# Patient Record
Sex: Male | Born: 1985 | Race: White | Hispanic: No | Marital: Married | Smoking: Never smoker
Health system: Southern US, Community
[De-identification: ages and names within clinical notes are randomized; demographics above are authoritative.]

## PROBLEM LIST (undated history)

## (undated) DIAGNOSIS — K219 Gastro-esophageal reflux disease without esophagitis: Secondary | ICD-10-CM

## (undated) DIAGNOSIS — T7840XA Allergy, unspecified, initial encounter: Secondary | ICD-10-CM

## (undated) DIAGNOSIS — M2141 Flat foot [pes planus] (acquired), right foot: Secondary | ICD-10-CM

## (undated) DIAGNOSIS — K7581 Nonalcoholic steatohepatitis (NASH): Secondary | ICD-10-CM

## (undated) DIAGNOSIS — M7650 Patellar tendinitis, unspecified knee: Secondary | ICD-10-CM

## (undated) DIAGNOSIS — F419 Anxiety disorder, unspecified: Secondary | ICD-10-CM

## (undated) DIAGNOSIS — L409 Psoriasis, unspecified: Secondary | ICD-10-CM

## (undated) DIAGNOSIS — Z5189 Encounter for other specified aftercare: Secondary | ICD-10-CM

## (undated) DIAGNOSIS — M2142 Flat foot [pes planus] (acquired), left foot: Secondary | ICD-10-CM

## (undated) DIAGNOSIS — J309 Allergic rhinitis, unspecified: Secondary | ICD-10-CM

## (undated) HISTORY — DX: Anxiety disorder, unspecified: F41.9

## (undated) HISTORY — DX: Flat foot (pes planus) (acquired), right foot: M21.42

## (undated) HISTORY — DX: Flat foot (pes planus) (acquired), right foot: M21.41

## (undated) HISTORY — PX: WISDOM TOOTH EXTRACTION: SHX21

## (undated) HISTORY — DX: Psoriasis, unspecified: L40.9

## (undated) HISTORY — DX: Nonalcoholic steatohepatitis (NASH): K75.81

## (undated) HISTORY — DX: Encounter for other specified aftercare: Z51.89

## (undated) HISTORY — DX: Patellar tendinitis, unspecified knee: M76.50

## (undated) HISTORY — DX: Allergy, unspecified, initial encounter: T78.40XA

---

## 2011-06-07 ENCOUNTER — Ambulatory Visit (INDEPENDENT_AMBULATORY_CARE_PROVIDER_SITE_OTHER): Payer: BC Managed Care – PPO

## 2011-06-07 DIAGNOSIS — J019 Acute sinusitis, unspecified: Secondary | ICD-10-CM

## 2011-06-07 DIAGNOSIS — J029 Acute pharyngitis, unspecified: Secondary | ICD-10-CM

## 2012-03-17 ENCOUNTER — Ambulatory Visit (INDEPENDENT_AMBULATORY_CARE_PROVIDER_SITE_OTHER): Payer: BC Managed Care – PPO | Admitting: Physician Assistant

## 2012-03-17 ENCOUNTER — Ambulatory Visit
Admission: RE | Admit: 2012-03-17 | Discharge: 2012-03-17 | Disposition: A | Discharge status: Home / Self Care | Payer: Self-pay | Source: Ambulatory Visit | Attending: Physician Assistant | Admitting: Physician Assistant

## 2012-03-17 VITALS — BP 138/78 | HR 84 | Temp 97.7°F | Resp 16 | Ht 78.0 in | Wt 263.0 lb

## 2012-03-17 DIAGNOSIS — R1011 Right upper quadrant pain: Secondary | ICD-10-CM

## 2012-03-17 DIAGNOSIS — R110 Nausea: Secondary | ICD-10-CM

## 2012-03-17 LAB — POCT URINALYSIS DIPSTICK
Bilirubin, UA: NEGATIVE
Blood, UA: NEGATIVE
Glucose, UA: NEGATIVE
Leukocytes, UA: NEGATIVE
Nitrite, UA: NEGATIVE

## 2012-03-17 LAB — COMPREHENSIVE METABOLIC PANEL
ALT: 133 U/L — ABNORMAL HIGH (ref 0–53)
AST: 67 U/L — ABNORMAL HIGH (ref 0–37)
Albumin: 4.7 g/dL (ref 3.5–5.2)
Calcium: 9.9 mg/dL (ref 8.4–10.5)
Chloride: 105 mEq/L (ref 96–112)
Potassium: 4.6 mEq/L (ref 3.5–5.3)
Total Protein: 7.9 g/dL (ref 6.0–8.3)

## 2012-03-17 LAB — POCT CBC
HCT, POC: 52.4 % (ref 43.5–53.7)
Hemoglobin: 17 g/dL (ref 14.1–18.1)
MCH, POC: 31.1 pg (ref 27–31.2)
MPV: 9.3 fL (ref 0–99.8)
POC MID %: 6.2 %M (ref 0–12)
RBC: 5.46 M/uL (ref 4.69–6.13)
WBC: 7.7 10*3/uL (ref 4.6–10.2)

## 2012-03-17 MED ORDER — ONDANSETRON 8 MG PO TBDP: 8.0000 mg | ORAL_TABLET | Freq: Three times a day (TID) | ORAL | Status: DC | PRN

## 2012-03-17 MED ORDER — HYDROCODONE-ACETAMINOPHEN 5-500 MG PO TABS: 1.0000 | ORAL_TABLET | Freq: Three times a day (TID) | ORAL | Status: DC | PRN

## 2012-03-17 NOTE — Patient Instructions (Signed)
Ultrasound at [facility] at 12:30pm today.  We will call you with results.  Use Vicodin for pain and Zofran for nausea as needed.

## 2012-03-17 NOTE — Progress Notes (Signed)
Subjective:    Patient ID: Luis Khan, male    DOB: 1985-10-11, 26 y.o.   MRN: [id number]  HPI  Luis Khan is a 26 yr old male with a 12 hour history of RUQ abdominal pain.  About 9pm last night he noticed pain under his right ribs.  He thought this might be gas pain and took some gas relief medication but experienced no relief.  The pain worsened through the evening.  He had difficulty sleeping and finding a comfortable position.  Last night laying down felt better, today sitting up feels better.  He states the pain is similar to a side pain that you would get while exercising.  He had some nausea and dry heaving this morning, but no vomiting.  He has not been nauseous for a couple hours though.  Lots of gas and burping today.  He had a normal bowel movement this morning.  He has never had pain like this before.  He ate pasta for dinner last night at [employer].  Has not eaten today.  There is no mechanism of injury.  The pain occasionally radiates across the abdomen from the RUQ to episgastrum.  No radiation to the shoulder.  No back pain, no urinary symptoms.  No fever, chills.  Patient still has gallbladder and appendix.  Patient does note a history of hypertriglyceridemia and non-alcoholic fatty liver.      Review of Systems  Constitutional: Positive for appetite change. Negative for fever, chills and diaphoresis.  HENT: Negative.   Eyes: Negative.   Respiratory: Negative.   Cardiovascular: Negative.   Genitourinary: Negative.   Musculoskeletal: Negative.   Neurological: Negative.        Objective:   Physical Exam  Constitutional: He is oriented to person, place, and time. He appears well-developed and well-nourished. No distress.  Cardiovascular: Normal rate, regular rhythm and normal heart sounds.   Pulmonary/Chest: Breath sounds normal. He has no wheezes. He has no rales.  Abdominal: Soft. Normal appearance and bowel sounds are normal. He exhibits no distension and no mass. There  is no hepatosplenomegaly. There is tenderness in the right upper quadrant and epigastric area. There is no rigidity, no rebound, no guarding, no CVA tenderness and no tenderness at McBurney's point.       Murphy's sign negative, but increased pain.  Rosving's and obturator signs negative  Lymphadenopathy:    He has no cervical adenopathy.  Neurological: He is alert and oriented to person, place, and time.  Skin: Skin is warm and dry.    Filed Vitals:   03/17/12 0910  BP: 138/78  Pulse: 84  Temp: 97.7 F (36.5 C)  Resp: 16    Results for orders placed in visit on 03/17/12  POCT CBC      Component Value Range   WBC 7.7  4.6 - 10.2 K/uL   Lymph, poc 1.8  0.6 - 3.4   POC LYMPH PERCENT 23.3  10 - 50 %L   MID (cbc) 0.5  0 - 0.9   POC MID % 6.2  0 - 12 %M   POC Granulocyte 5.4  2 - 6.9   Granulocyte percent 70.5  37 - 80 %G   RBC 5.46  4.69 - 6.13 M/uL   Hemoglobin 17.0  14.1 - 18.1 g/dL   HCT, POC 40.9  81.1 - 53.7 %   MCV 95.9  80 - 97 fL   MCH, POC 31.1  27 - 31.2 pg   MCHC 32.4  31.8 -  35.4 g/dL   RDW, POC 14.7     Platelet Count, POC 282  142 - 424 K/uL   MPV 9.3  0 - 99.8 fL  POCT URINALYSIS DIPSTICK      Component Value Range   Color, UA YELLOW     Clarity, UA CLEAR     Glucose, UA NEG     Bilirubin, UA NEG     Ketones, UA NEG     Spec Grav, UA 1.020     Blood, UA NEG     pH, UA 7.5     Protein, UA NEG     Urobilinogen, UA 0.2     Nitrite, UA NEG     Leukocytes, UA Negative          Assessment & Plan:   1. Abdominal pain, RUQ  POCT CBC, POCT urinalysis dipstick, Comprehensive metabolic panel, Lipase, US Abdomen Limited RUQ, HYDROcodone-acetaminophen (VICODIN) 5-500 MG per tablet  2. Nausea  ondansetron (ZOFRAN-ODT) 8 MG disintegrating tablet   Luis Khan is a 26 yr old male with a 12 hour history of RUQ pain and some nausea.  Abdominal exam reveals tenderness in the RUQ and epigastric area.  Murphy's sign is technically negative, but he experienced increased  pain.  The rest of his abdominal exam is unremarkable.  UA is clear.  CBC is normal.  CMet and lipase are pending.  The patient has an appointment this afternoon at [facility] for RUQ ultrasound.  At this point, we will treat his pain and nausea, and await a read on his U/S.  Should that come back negative, I think we can continue to treat his symptoms with close follow-up while we await his pending labs.

## 2012-03-17 NOTE — Progress Notes (Signed)
Reviewed and agree.

## 2012-03-20 ENCOUNTER — Telehealth: Payer: Self-pay | Admitting: Physician Assistant

## 2012-03-20 NOTE — Telephone Encounter (Signed)
Spoke with the patient regarding cmet and lipase being mildly elevated.  He is feeling much better.  Recommended he RTC in a few weeks to recheck labs including fasting lipids.

## 2014-10-03 DIAGNOSIS — K219 Gastro-esophageal reflux disease without esophagitis: Secondary | ICD-10-CM

## 2014-10-03 HISTORY — DX: Gastro-esophageal reflux disease without esophagitis: K21.9

## 2015-04-09 DIAGNOSIS — K219 Gastro-esophageal reflux disease without esophagitis: Secondary | ICD-10-CM | POA: Insufficient documentation

## 2015-07-08 ENCOUNTER — Other Ambulatory Visit: Payer: Self-pay | Admitting: Medical

## 2015-07-08 DIAGNOSIS — R1011 Right upper quadrant pain: Secondary | ICD-10-CM

## 2015-07-09 ENCOUNTER — Ambulatory Visit: Payer: BLUE CROSS/BLUE SHIELD

## 2015-07-09 ENCOUNTER — Ambulatory Visit
Admission: RE | Admit: 2015-07-09 | Discharge: 2015-07-09 | Disposition: A | Discharge status: Home / Self Care | Payer: BLUE CROSS/BLUE SHIELD | Source: Ambulatory Visit | Attending: Medical | Admitting: Medical

## 2015-07-09 DIAGNOSIS — R110 Nausea: Secondary | ICD-10-CM | POA: Insufficient documentation

## 2015-07-09 DIAGNOSIS — R1011 Right upper quadrant pain: Secondary | ICD-10-CM | POA: Diagnosis not present

## 2015-07-31 ENCOUNTER — Encounter: Payer: Self-pay | Admitting: *Deleted

## 2015-08-01 ENCOUNTER — Ambulatory Visit
Admission: RE | Admit: 2015-08-01 | Discharge: 2015-08-01 | Disposition: A | Discharge status: Home / Self Care | Payer: BLUE CROSS/BLUE SHIELD | Source: Ambulatory Visit | Attending: Gastroenterology | Admitting: Gastroenterology

## 2015-08-01 ENCOUNTER — Encounter
Admission: RE | Disposition: A | Discharge status: Home / Self Care | Payer: Self-pay | Source: Ambulatory Visit | Attending: Gastroenterology

## 2015-08-01 ENCOUNTER — Encounter: Payer: Self-pay | Admitting: *Deleted

## 2015-08-01 ENCOUNTER — Ambulatory Visit: Payer: BLUE CROSS/BLUE SHIELD | Admitting: Anesthesiology

## 2015-08-01 DIAGNOSIS — K219 Gastro-esophageal reflux disease without esophagitis: Secondary | ICD-10-CM | POA: Insufficient documentation

## 2015-08-01 DIAGNOSIS — Z8249 Family history of ischemic heart disease and other diseases of the circulatory system: Secondary | ICD-10-CM | POA: Insufficient documentation

## 2015-08-01 DIAGNOSIS — K7581 Nonalcoholic steatohepatitis (NASH): Secondary | ICD-10-CM | POA: Insufficient documentation

## 2015-08-01 DIAGNOSIS — Z79899 Other long term (current) drug therapy: Secondary | ICD-10-CM | POA: Insufficient documentation

## 2015-08-01 DIAGNOSIS — K2970 Gastritis, unspecified, without bleeding: Secondary | ICD-10-CM | POA: Diagnosis not present

## 2015-08-01 DIAGNOSIS — R1013 Epigastric pain: Secondary | ICD-10-CM | POA: Diagnosis present

## 2015-08-01 HISTORY — DX: Allergic rhinitis, unspecified: J30.9

## 2015-08-01 HISTORY — DX: Gastro-esophageal reflux disease without esophagitis: K21.9

## 2015-08-01 HISTORY — PX: ESOPHAGOGASTRODUODENOSCOPY (EGD) WITH PROPOFOL: SHX5813

## 2015-08-01 SURGERY — ESOPHAGOGASTRODUODENOSCOPY (EGD) WITH PROPOFOL
Anesthesia: General

## 2015-08-01 MED ORDER — LIDOCAINE HCL (CARDIAC) 20 MG/ML IV SOLN
INTRAVENOUS | Status: DC | PRN
  Administered 2015-08-01: 60 mg via INTRAVENOUS

## 2015-08-01 MED ORDER — PROPOFOL 500 MG/50ML IV EMUL
INTRAVENOUS | Status: DC | PRN
  Administered 2015-08-01: 160 ug/kg/min via INTRAVENOUS

## 2015-08-01 MED ORDER — SODIUM CHLORIDE 0.9 % IV SOLN
INTRAVENOUS | Status: DC
  Administered 2015-08-01: 15:00:00 via INTRAVENOUS

## 2015-08-01 MED ORDER — SODIUM CHLORIDE 0.9 % IV SOLN
INTRAVENOUS | Status: DC
  Administered 2015-08-01: 1000 mL via INTRAVENOUS

## 2015-08-01 MED ORDER — FENTANYL CITRATE (PF) 100 MCG/2ML IJ SOLN
INTRAMUSCULAR | Status: DC | PRN
  Administered 2015-08-01: 50 ug via INTRAVENOUS

## 2015-08-01 MED ORDER — PROPOFOL 10 MG/ML IV BOLUS
INTRAVENOUS | Status: DC | PRN
  Administered 2015-08-01: 20 mg via INTRAVENOUS
  Administered 2015-08-01: 30 mg via INTRAVENOUS
  Administered 2015-08-01: 70 mg via INTRAVENOUS
  Administered 2015-08-01: 30 mg via INTRAVENOUS

## 2015-08-01 MED ORDER — MIDAZOLAM HCL 2 MG/2ML IJ SOLN
INTRAMUSCULAR | Status: DC | PRN
  Administered 2015-08-01 (×2): 1 mg via INTRAVENOUS

## 2015-08-01 NOTE — Op Note (Signed)
[facility] Gastroenterology Patient Name: Luis Khan Procedure Date: 08/01/2015 3:14 PM MRN: [id number] Account #: [id number] Date of Birth: May 04, 1986 Admit Type: Outpatient Age: 30 Room: [facility] ENDO ROOM 3 Gender: Male Note Status: Finalized Procedure:            Upper GI endoscopy Indications:          Epigastric abdominal pain, Dyspepsia, Gastro-esophageal                        reflux disease Providers:            Lollie Sails, MD Referring MD:         Estill Dooms. Lily Peer, MD (Referring MD) Medicines:            Monitored Anesthesia Care Complications:        No immediate complications. Procedure:            Pre-Anesthesia Assessment:                       - ASA Grade Assessment: II - A patient with mild                        systemic disease.                       After obtaining informed consent, the endoscope was                        passed under direct vision. Throughout the procedure,                        the patient's blood pressure, pulse, and oxygen                        saturations were monitored continuously. The Endoscope                        was introduced through the mouth, and advanced to the                        fourth part of duodenum. The upper GI endoscopy was                        accomplished without difficulty. The patient tolerated                        the procedure well. Findings:      The Z-line was irregular. Biopsies were taken with a cold forceps for       histology.      Diffuse mild erythema was found in the upper third of the esophagus.      Diffuse minimal inflammation characterized by erythema and granularity       was found in the gastric body. Biopsies were taken with a cold forceps       for histology.      The cardia and gastric fundus were normal on retroflexion.      Patchy mucosal flattening was found in the second portion of the       duodenum and in the third portion of the  duodenum. Impression:           - Z-line  irregular. Biopsied.                       - Erythema in the upper third of the esophagus.                       - Gastritis. Biopsied.                       - Flattened mucosa was found in the duodenum, rule out                        celiac disease. Recommendation:       - Return to GI clinic as previously scheduled.                       - Continue present medications. Procedure Code(s):    --- Professional ---                       [phone], Esophagogastroduodenoscopy, flexible, transoral;                        with biopsy, single or multiple CPT copyright 2016 American Medical Association. All rights reserved. The codes documented in this report are preliminary and upon coder review may  be revised to meet current compliance requirements. Lollie Sails, MD 08/01/2015 3:38:25 PM This report has been signed electronically. Number of Addenda: 0 Note Initiated On: 08/01/2015 3:14 PM      [facility]

## 2015-08-01 NOTE — H&P (Signed)
Outpatient short stay form Pre-procedure 08/01/2015 3:08 PM Lollie Sails MD  Primary Physician: No primary care provider  Reason for visit:  EGD  History of present illness:  Patient is a 30 year old male presenting today with history of epigastric pain and reflux. Even scheduled originally for a conscious sedation procedure however adequate sedation was not obtained and so he was rescheduled for propofol.    Current facility-administered medications:  .  0.9 %  sodium chloride infusion, , Intravenous, Continuous, Lollie Sails, MD, Last Rate: 20 mL/hr at 08/01/15 1349, 1,000 mL at 08/01/15 1349 .  0.9 %  sodium chloride infusion, , Intravenous, Continuous, Lollie Sails, MD  Prescriptions prior to admission  Medication Sig Dispense Refill Last Dose  . fexofenadine (ALLEGRA) 30 MG tablet Take 30 mg by mouth 2 (two) times daily.   08/01/2015 at Unknown time  . pantoprazole (PROTONIX) 40 MG tablet Take 40 mg by mouth daily.     . ranitidine (ZANTAC) 150 MG capsule Take 150 mg by mouth 2 (two) times daily.   08/01/2015 at Unknown time  . fluticasone (FLONASE) 50 MCG/ACT nasal spray Place 2 sprays into the nose daily.   Taking  . HYDROcodone-acetaminophen (VICODIN) 5-500 MG per tablet Take 1 tablet by mouth every 8 (eight) hours as needed for pain. 20 tablet 0   . ondansetron (ZOFRAN-ODT) 8 MG disintegrating tablet Take 1 tablet (8 mg total) by mouth every 8 (eight) hours as needed for nausea. 30 tablet 0      No Known Allergies   Past Medical History  Diagnosis Date  . Allergy   . NASH (nonalcoholic steatohepatitis)   . Chronic allergic rhinitis   . GERD (gastroesophageal reflux disease)     Review of systems:      Physical Exam    Heart and lungs: Regular rate and rhythm without rub or gallop, lungs are bilaterally clear.    HEENT: Normocephalic atraumatic eyes are anicteric    Other:     Pertinant exam for procedure: Soft nontender nondistended bowel sounds  positive normoactive.    Planned proceedures: EGD and indicated procedures. I have discussed the risks benefits and complications of procedures to include not limited to bleeding, infection, perforation and the risk of sedation and the patient wishes to proceed.    Lollie Sails, MD Gastroenterology 08/01/2015  3:08 PM

## 2015-08-01 NOTE — Anesthesia Preprocedure Evaluation (Signed)
Anesthesia Evaluation  Patient identified by MRN, date of birth, ID band Patient awake    Reviewed: Allergy & Precautions, H&P , NPO status , Patient's Chart, lab work & pertinent test results  Airway Mallampati: II  TM Distance: >3 FB Neck ROM: full    Dental  (+) Poor Dentition   Pulmonary neg pulmonary ROS, neg shortness of breath,    Pulmonary exam normal breath sounds clear to auscultation       Cardiovascular Exercise Tolerance: Good (-) angina(-) Past MI negative cardio ROS Normal cardiovascular exam Rhythm:regular Rate:Normal     Neuro/Psych negative neurological ROS  negative psych ROS   GI/Hepatic GERD  Controlled,(+) Hepatitis -  Endo/Other  negative endocrine ROS  Renal/GU negative Renal ROS  negative genitourinary   Musculoskeletal negative musculoskeletal ROS (+)   Abdominal   Peds negative pediatric ROS (+)  Hematology negative hematology ROS (+)   Anesthesia Other Findings Past Medical History:   Allergy                                                      NASH (nonalcoholic steatohepatitis)                          Chronic allergic rhinitis                                    GERD (gastroesophageal reflux disease)                      Past Surgical History:   WISDOM TOOTH EXTRACTION                                      BMI    Body Mass Index   28.69 kg/m 2      Reproductive/Obstetrics negative OB ROS                             Anesthesia Physical Anesthesia Plan  ASA: III  Anesthesia Plan: General   Post-op Pain Management:    Induction:   Airway Management Planned:   Additional Equipment:   Intra-op Plan:   Post-operative Plan:   Informed Consent: I have reviewed the patients History and Physical, chart, labs and discussed the procedure including the risks, benefits and alternatives for the proposed anesthesia with the patient or authorized  representative who has indicated his/her understanding and acceptance.   Dental Advisory Given  Plan Discussed with: Anesthesiologist, CRNA and Surgeon  Anesthesia Plan Comments:         Anesthesia Quick Evaluation

## 2015-08-01 NOTE — Transfer of Care (Signed)
Immediate Anesthesia Transfer of Care Note  Patient: Luis Khan  Procedure(s) Performed: Procedure(s): ESOPHAGOGASTRODUODENOSCOPY (EGD) WITH PROPOFOL (N/A)  Patient Location: PACU  Anesthesia Type:General  Level of Consciousness: sedated  Airway & Oxygen Therapy: Patient Spontanous Breathing and Patient connected to nasal cannula oxygen  Post-op Assessment: Report given to RN  Post vital signs: Reviewed and stable  Last Vitals:  Filed Vitals:   08/01/15 1347 08/01/15 1539  BP:  120/84  Pulse: 63 50  Temp: 36.9 C 36.1 C  Resp: 20 20    Complications: No apparent anesthesia complications

## 2015-08-05 LAB — SURGICAL PATHOLOGY

## 2015-08-05 NOTE — Anesthesia Postprocedure Evaluation (Signed)
Anesthesia Post Note  Patient: Luis Khan  Procedure(s) Performed: Procedure(s) (LRB): ESOPHAGOGASTRODUODENOSCOPY (EGD) WITH PROPOFOL (N/A)  Patient location during evaluation: Endoscopy Anesthesia Type: General Level of consciousness: awake and alert Pain management: pain level controlled Vital Signs Assessment: post-procedure vital signs reviewed and stable Respiratory status: spontaneous breathing, nonlabored ventilation, respiratory function stable and patient connected to nasal cannula oxygen Cardiovascular status: blood pressure returned to baseline and stable Postop Assessment: no signs of nausea or vomiting Anesthetic complications: no    Last Vitals:  Filed Vitals:   08/01/15 1559 08/01/15 1609  BP: 116/81 119/72  Pulse: 63 55  Temp:    Resp: 23 12    Last Pain:  Filed Vitals:   08/02/15 0952  PainSc: 0-No pain                 Martha Clan

## 2015-08-06 ENCOUNTER — Encounter: Payer: Self-pay | Admitting: Gastroenterology

## 2015-09-09 ENCOUNTER — Ambulatory Visit
Admission: RE | Admit: 2015-09-09 | Discharge: 2015-09-09 | Disposition: A | Discharge status: Home / Self Care | Payer: BLUE CROSS/BLUE SHIELD | Source: Ambulatory Visit | Attending: Medical | Admitting: Medical

## 2015-09-09 ENCOUNTER — Other Ambulatory Visit: Payer: Self-pay | Admitting: Medical

## 2015-09-09 DIAGNOSIS — S29012A Strain of muscle and tendon of back wall of thorax, initial encounter: Secondary | ICD-10-CM | POA: Insufficient documentation

## 2015-09-09 DIAGNOSIS — M546 Pain in thoracic spine: Secondary | ICD-10-CM

## 2015-11-18 ENCOUNTER — Other Ambulatory Visit: Payer: Self-pay | Admitting: Gastroenterology

## 2015-11-18 ENCOUNTER — Other Ambulatory Visit (HOSPITAL_COMMUNITY): Payer: Self-pay | Admitting: Gastroenterology

## 2015-11-18 DIAGNOSIS — K760 Fatty (change of) liver, not elsewhere classified: Secondary | ICD-10-CM

## 2015-11-21 ENCOUNTER — Ambulatory Visit (HOSPITAL_COMMUNITY)
Admission: RE | Admit: 2015-11-21 | Discharge: 2015-11-21 | Disposition: A | Discharge status: Home / Self Care | Payer: BLUE CROSS/BLUE SHIELD | Source: Ambulatory Visit | Attending: Gastroenterology | Admitting: Gastroenterology

## 2015-11-21 DIAGNOSIS — K760 Fatty (change of) liver, not elsewhere classified: Secondary | ICD-10-CM | POA: Diagnosis not present

## 2015-11-21 DIAGNOSIS — K8020 Calculus of gallbladder without cholecystitis without obstruction: Secondary | ICD-10-CM | POA: Diagnosis not present

## 2016-07-07 ENCOUNTER — Other Ambulatory Visit: Payer: Self-pay | Admitting: Medical

## 2016-07-07 DIAGNOSIS — R1011 Right upper quadrant pain: Secondary | ICD-10-CM

## 2016-07-09 ENCOUNTER — Ambulatory Visit
Admission: RE | Admit: 2016-07-09 | Discharge: 2016-07-09 | Disposition: A | Discharge status: Home / Self Care | Payer: BLUE CROSS/BLUE SHIELD | Source: Ambulatory Visit | Attending: Medical | Admitting: Medical

## 2016-07-09 DIAGNOSIS — R1013 Epigastric pain: Secondary | ICD-10-CM | POA: Diagnosis not present

## 2016-07-09 DIAGNOSIS — K8020 Calculus of gallbladder without cholecystitis without obstruction: Secondary | ICD-10-CM | POA: Diagnosis not present

## 2016-07-09 DIAGNOSIS — R1011 Right upper quadrant pain: Secondary | ICD-10-CM | POA: Insufficient documentation

## 2016-07-09 DIAGNOSIS — K760 Fatty (change of) liver, not elsewhere classified: Secondary | ICD-10-CM | POA: Diagnosis not present

## 2016-07-12 DIAGNOSIS — K8010 Calculus of gallbladder with chronic cholecystitis without obstruction: Secondary | ICD-10-CM | POA: Diagnosis not present

## 2016-07-16 ENCOUNTER — Encounter
Admission: RE | Admit: 2016-07-16 | Discharge: 2016-07-16 | Disposition: A | Discharge status: Home / Self Care | Payer: BLUE CROSS/BLUE SHIELD | Source: Ambulatory Visit | Attending: Surgery | Admitting: Surgery

## 2016-07-16 NOTE — Patient Instructions (Addendum)
  Your procedure is scheduled on: 07-23-16  Report to Same Day Surgery 2nd floor medical mall [facility] Entrance-take elevator on left to 2nd floor.  Check in with surgery information desk.) To find out your arrival time please call [phone] between 1PM - 3PM on 07-22-16  Remember: Instructions that are not followed completely may result in serious medical risk, up to and including death, or upon the discretion of your surgeon and anesthesiologist your surgery may need to be rescheduled.    _x___ 1. Do not eat food or drink liquids after midnight. No gum chewing or hard candies.     __x__ 2. No Alcohol for 24 hours before or after surgery.   __x__3. No Smoking for 24 prior to surgery.   ____  4. Bring all medications with you on the day of surgery if instructed.    __x__ 5. Notify your doctor if there is any change in your medical condition     (cold, fever, infections).     Do not wear jewelry, make-up, hairpins, clips or nail polish.  Do not wear lotions, powders, or perfumes. You may wear deodorant.  Do not shave 48 hours prior to surgery. Men may shave face and neck.  Do not bring valuables to the hospital.    [facility] is not responsible for any belongings or valuables.               Contacts, dentures or bridgework may not be worn into surgery.  Leave your suitcase in the car. After surgery it may be brought to your room.  For patients admitted to the hospital, discharge time is determined by your treatment team.   Patients discharged the day of surgery will not be allowed to drive home.  You will need someone to drive you home and stay with you the night of your procedure.    Please read over the following fact sheets that you were given:   [facility] Preparing for Surgery and or MRSA Information   _x___ Take these medicines the morning of surgery with A SIP OF WATER:    1. PROTNIX  2.  3.  4.  5.  6.  ____Fleets enema or Magnesium Citrate as  directed.   _x___ Use CHG Soap or sage wipes as directed on instruction sheet   ____ Use inhalers on the day of surgery and bring to hospital day of surgery  ____ Stop metformin 2 days prior to surgery    ____ Take 1/2 of usual insulin dose the night before surgery and none on the morning of           surgery.   ____ Stop Aspirin, Coumadin, Pllavix ,Eliquis, Effient, or Pradaxa  x__ Stop Anti-inflammatories such as Advil, Aleve, Ibuprofen, Motrin, Naproxen,          Naprosyn, Goodies powders or aspirin products NOW-Ok to take Tylenol.   ____ Stop supplements until after surgery.    ____ Bring C-Pap to the hospital.

## 2016-07-23 ENCOUNTER — Ambulatory Visit: Payer: BLUE CROSS/BLUE SHIELD | Admitting: Anesthesiology

## 2016-07-23 ENCOUNTER — Ambulatory Visit
Admission: RE | Admit: 2016-07-23 | Discharge: 2016-07-23 | Disposition: A | Discharge status: Home / Self Care | Payer: BLUE CROSS/BLUE SHIELD | Source: Ambulatory Visit | Attending: Surgery | Admitting: Surgery

## 2016-07-23 ENCOUNTER — Ambulatory Visit: Payer: BLUE CROSS/BLUE SHIELD

## 2016-07-23 ENCOUNTER — Encounter: Payer: Self-pay | Admitting: *Deleted

## 2016-07-23 ENCOUNTER — Encounter
Admission: RE | Disposition: A | Discharge status: Home / Self Care | Payer: Self-pay | Source: Ambulatory Visit | Attending: Surgery

## 2016-07-23 DIAGNOSIS — K7581 Nonalcoholic steatohepatitis (NASH): Secondary | ICD-10-CM | POA: Insufficient documentation

## 2016-07-23 DIAGNOSIS — K8020 Calculus of gallbladder without cholecystitis without obstruction: Secondary | ICD-10-CM | POA: Diagnosis not present

## 2016-07-23 DIAGNOSIS — K219 Gastro-esophageal reflux disease without esophagitis: Secondary | ICD-10-CM | POA: Diagnosis not present

## 2016-07-23 DIAGNOSIS — Z419 Encounter for procedure for purposes other than remedying health state, unspecified: Secondary | ICD-10-CM

## 2016-07-23 DIAGNOSIS — K8010 Calculus of gallbladder with chronic cholecystitis without obstruction: Secondary | ICD-10-CM | POA: Diagnosis not present

## 2016-07-23 HISTORY — PX: CHOLECYSTECTOMY: SHX55

## 2016-07-23 SURGERY — LAPAROSCOPIC CHOLECYSTECTOMY WITH INTRAOPERATIVE CHOLANGIOGRAM
Anesthesia: General | Wound class: Clean Contaminated

## 2016-07-23 MED ORDER — MIDAZOLAM HCL 2 MG/2ML IJ SOLN
INTRAMUSCULAR | Status: AC
  Filled 2016-07-23: qty 2

## 2016-07-23 MED ORDER — HYDROMORPHONE HCL 1 MG/ML IJ SOLN
INTRAMUSCULAR | Status: AC
  Administered 2016-07-23: 0.5 mg via INTRAVENOUS
  Filled 2016-07-23: qty 1

## 2016-07-23 MED ORDER — FENTANYL CITRATE (PF) 100 MCG/2ML IJ SOLN
INTRAMUSCULAR | Status: DC | PRN
  Administered 2016-07-23: 50 ug via INTRAVENOUS
  Administered 2016-07-23: 100 ug via INTRAVENOUS
  Administered 2016-07-23: 50 ug via INTRAVENOUS

## 2016-07-23 MED ORDER — PROPOFOL 10 MG/ML IV BOLUS
INTRAVENOUS | Status: DC | PRN
  Administered 2016-07-23: 200 mg via INTRAVENOUS

## 2016-07-23 MED ORDER — FENTANYL CITRATE (PF) 100 MCG/2ML IJ SOLN
INTRAMUSCULAR | Status: AC
  Filled 2016-07-23: qty 2

## 2016-07-23 MED ORDER — DEXAMETHASONE SODIUM PHOSPHATE 10 MG/ML IJ SOLN
INTRAMUSCULAR | Status: AC
  Filled 2016-07-23: qty 1

## 2016-07-23 MED ORDER — ONDANSETRON HCL 4 MG/2ML IJ SOLN
INTRAMUSCULAR | Status: DC | PRN
  Administered 2016-07-23: 4 mg via INTRAVENOUS

## 2016-07-23 MED ORDER — SODIUM CHLORIDE 0.9 % IJ SOLN
INTRAMUSCULAR | Status: AC
  Filled 2016-07-23: qty 10

## 2016-07-23 MED ORDER — ROCURONIUM BROMIDE 50 MG/5ML IV SOLN
INTRAVENOUS | Status: AC
  Filled 2016-07-23: qty 1

## 2016-07-23 MED ORDER — LACTATED RINGERS IV SOLN
INTRAVENOUS | Status: DC
  Administered 2016-07-23: 13:00:00 via INTRAVENOUS

## 2016-07-23 MED ORDER — DEXAMETHASONE SODIUM PHOSPHATE 10 MG/ML IJ SOLN
INTRAMUSCULAR | Status: DC | PRN
  Administered 2016-07-23: 10 mg via INTRAVENOUS

## 2016-07-23 MED ORDER — ONDANSETRON HCL 4 MG/2ML IJ SOLN
INTRAMUSCULAR | Status: AC
  Filled 2016-07-23: qty 2

## 2016-07-23 MED ORDER — HYDROCODONE-ACETAMINOPHEN 5-325 MG PO TABS: 1.0000 | ORAL_TABLET | ORAL | Status: DC | PRN

## 2016-07-23 MED ORDER — EPHEDRINE SULFATE 50 MG/ML IJ SOLN
INTRAMUSCULAR | Status: AC
  Filled 2016-07-23: qty 1

## 2016-07-23 MED ORDER — SEVOFLURANE IN SOLN
RESPIRATORY_TRACT | Status: AC
  Filled 2016-07-23: qty 250

## 2016-07-23 MED ORDER — ROCURONIUM BROMIDE 100 MG/10ML IV SOLN
INTRAVENOUS | Status: DC | PRN
  Administered 2016-07-23: 10 mg via INTRAVENOUS
  Administered 2016-07-23: 20 mg via INTRAVENOUS
  Administered 2016-07-23: 40 mg via INTRAVENOUS

## 2016-07-23 MED ORDER — SODIUM CHLORIDE 0.9 % IV SOLN
INTRAVENOUS | Status: DC | PRN
  Administered 2016-07-23: 4 mL

## 2016-07-23 MED ORDER — MIDAZOLAM HCL 2 MG/2ML IJ SOLN
INTRAMUSCULAR | Status: DC | PRN
  Administered 2016-07-23: 2 mg via INTRAVENOUS

## 2016-07-23 MED ORDER — ONDANSETRON HCL 4 MG/2ML IJ SOLN: 4.0000 mg | Freq: Once | INTRAMUSCULAR | Status: DC | PRN

## 2016-07-23 MED ORDER — KETOROLAC TROMETHAMINE 30 MG/ML IJ SOLN
INTRAMUSCULAR | Status: AC
  Filled 2016-07-23: qty 1

## 2016-07-23 MED ORDER — EPHEDRINE SULFATE 50 MG/ML IJ SOLN
INTRAMUSCULAR | Status: DC | PRN
  Administered 2016-07-23 (×2): 10 mg via INTRAVENOUS

## 2016-07-23 MED ORDER — SODIUM CHLORIDE 0.9 % IV SOLN
INTRAVENOUS | Status: DC | PRN
  Administered 2016-07-23: 15:00:00 via INTRAMUSCULAR

## 2016-07-23 MED ORDER — PROPOFOL 10 MG/ML IV BOLUS
INTRAVENOUS | Status: AC
  Filled 2016-07-23: qty 20

## 2016-07-23 MED ORDER — SODIUM CHLORIDE 0.9 % IJ SOLN
INTRAMUSCULAR | Status: AC
  Filled 2016-07-23: qty 50

## 2016-07-23 MED ORDER — SUGAMMADEX SODIUM 200 MG/2ML IV SOLN
INTRAVENOUS | Status: DC | PRN
  Administered 2016-07-23: 200 mg via INTRAVENOUS

## 2016-07-23 MED ORDER — FENTANYL CITRATE (PF) 100 MCG/2ML IJ SOLN
25.0000 ug | INTRAMUSCULAR | Status: DC | PRN
  Administered 2016-07-23 (×2): 50 ug via INTRAVENOUS

## 2016-07-23 MED ORDER — SUGAMMADEX SODIUM 200 MG/2ML IV SOLN
INTRAVENOUS | Status: AC
  Filled 2016-07-23: qty 2

## 2016-07-23 MED ORDER — KETOROLAC TROMETHAMINE 30 MG/ML IJ SOLN
INTRAMUSCULAR | Status: DC | PRN
  Administered 2016-07-23: 30 mg via INTRAVENOUS

## 2016-07-23 MED ORDER — HYDROMORPHONE HCL 1 MG/ML IJ SOLN
0.5000 mg | INTRAMUSCULAR | Status: DC | PRN
  Administered 2016-07-23 (×2): 0.5 mg via INTRAVENOUS

## 2016-07-23 MED ORDER — LIDOCAINE HCL (CARDIAC) 20 MG/ML IV SOLN
INTRAVENOUS | Status: DC | PRN
  Administered 2016-07-23: 100 mg via INTRAVENOUS

## 2016-07-23 MED ORDER — HYDROCODONE-ACETAMINOPHEN 5-325 MG PO TABS: 1.0000 | ORAL_TABLET | ORAL | 0 refills | Status: DC | PRN

## 2016-07-23 MED ORDER — HEPARIN SODIUM (PORCINE) 5000 UNIT/ML IJ SOLN
INTRAMUSCULAR | Status: AC
  Filled 2016-07-23: qty 1

## 2016-07-23 SURGICAL SUPPLY — 40 items
APPLIER CLIP ROT 10 11.4 M/L (STAPLE) ×3
BENZOIN TINCTURE PRP APPL 2/3 (GAUZE/BANDAGES/DRESSINGS) ×3 IMPLANT
CANISTER SUCT 1200ML W/VALVE (MISCELLANEOUS) ×3 IMPLANT
CANNULA DILATOR 10 W/SLV (CANNULA) ×2 IMPLANT
CANNULA DILATOR 10MM W/SLV (CANNULA) ×1
CATH REDDICK CHOLANGI 4FR 50CM (CATHETERS) ×3 IMPLANT
CHLORAPREP W/TINT 26ML (MISCELLANEOUS) ×3 IMPLANT
CLIP APPLIE ROT 10 11.4 M/L (STAPLE) ×1 IMPLANT
CLOSURE WOUND 1/2 X4 (GAUZE/BANDAGES/DRESSINGS) ×1
DRAPE SHEET LG 3/4 BI-LAMINATE (DRAPES) ×3 IMPLANT
ELECT REM PT RETURN 9FT ADLT (ELECTROSURGICAL) ×3
ELECTRODE REM PT RTRN 9FT ADLT (ELECTROSURGICAL) ×1 IMPLANT
ENDOPOUCH RETRIEVER 10 (MISCELLANEOUS) ×3 IMPLANT
GAUZE SPONGE 4X4 12PLY STRL (GAUZE/BANDAGES/DRESSINGS) ×3 IMPLANT
GLOVE BIO SURGEON STRL SZ7.5 (GLOVE) ×3 IMPLANT
GOWN STRL REUS W/ TWL LRG LVL3 (GOWN DISPOSABLE) ×4 IMPLANT
GOWN STRL REUS W/TWL LRG LVL3 (GOWN DISPOSABLE) ×8
IRRIGATION STRYKERFLOW (MISCELLANEOUS) ×1 IMPLANT
IRRIGATOR STRYKERFLOW (MISCELLANEOUS) ×3
IV NS 1000ML (IV SOLUTION) ×2
IV NS 1000ML BAXH (IV SOLUTION) ×1 IMPLANT
KIT RM TURNOVER STRD PROC AR (KITS) ×3 IMPLANT
LABEL OR SOLS (LABEL) ×3 IMPLANT
NDL INSUFF ACCESS 14 VERSASTEP (NEEDLE) ×3 IMPLANT
NEEDLE FILTER BLUNT 18X 1/2SAF (NEEDLE) ×2
NEEDLE FILTER BLUNT 18X1 1/2 (NEEDLE) ×1 IMPLANT
NS IRRIG 500ML POUR BTL (IV SOLUTION) ×3 IMPLANT
PACK LAP CHOLECYSTECTOMY (MISCELLANEOUS) ×3 IMPLANT
SCISSORS METZENBAUM CVD 33 (INSTRUMENTS) ×3 IMPLANT
SEAL FOR SCOPE WARMER C3101 (MISCELLANEOUS) ×3 IMPLANT
SET SUCTION IRRIG HYDROSURG (IRRIGATION / IRRIGATOR) ×3 IMPLANT
SLEEVE ENDOPATH XCEL 5M (ENDOMECHANICALS) ×3 IMPLANT
STRIP CLOSURE SKIN 1/2X4 (GAUZE/BANDAGES/DRESSINGS) ×2 IMPLANT
SUT CHROMIC 5 0 RB 1 27 (SUTURE) ×3 IMPLANT
SUT VIC AB 0 CT2 27 (SUTURE) IMPLANT
SYR 3ML LL SCALE MARK (SYRINGE) ×3 IMPLANT
TROCAR XCEL NON-BLD 11X100MML (ENDOMECHANICALS) ×3 IMPLANT
TROCAR XCEL NON-BLD 5MMX100MML (ENDOMECHANICALS) ×3 IMPLANT
TUBING INSUFFLATOR HI FLOW (MISCELLANEOUS) ×3 IMPLANT
WATER STERILE IRR 1000ML POUR (IV SOLUTION) ×3 IMPLANT

## 2016-07-23 NOTE — Anesthesia Post-op Follow-up Note (Cosign Needed)
Anesthesia QCDR form completed.        

## 2016-07-23 NOTE — Anesthesia Procedure Notes (Signed)
Procedure Name: Intubation Date/Time: 07/23/2016 1:35 PM Performed by: Hedda Slade Pre-anesthesia Checklist: Patient identified, Patient being monitored, Timeout performed, Emergency Drugs available and Suction available Patient Re-evaluated:Patient Re-evaluated prior to inductionOxygen Delivery Method: Circle system utilized Preoxygenation: Pre-oxygenation with 100% oxygen Intubation Type: IV induction Ventilation: Mask ventilation without difficulty Laryngoscope Size: Mac and 4 Grade View: Grade I Tube type: Oral Tube size: 7.5 mm Number of attempts: 1 Airway Equipment and Method: Stylet Placement Confirmation: ETT inserted through vocal cords under direct vision,  positive ETCO2 and breath sounds checked- equal and bilateral Secured at: 21 cm Tube secured with: Tape Dental Injury: Teeth and Oropharynx as per pre-operative assessment

## 2016-07-23 NOTE — Anesthesia Preprocedure Evaluation (Signed)
Anesthesia Evaluation  Patient identified by MRN, date of birth, ID band Patient awake    Reviewed: Allergy & Precautions, H&P , NPO status , Patient's Chart, lab work & pertinent test results  Airway Mallampati: II  TM Distance: >3 FB Neck ROM: full    Dental  (+) Poor Dentition   Pulmonary neg pulmonary ROS, neg shortness of breath,    Pulmonary exam normal breath sounds clear to auscultation       Cardiovascular Exercise Tolerance: Good (-) angina(-) Past MI negative cardio ROS Normal cardiovascular exam Rhythm:regular Rate:Normal     Neuro/Psych negative neurological ROS  negative psych ROS   GI/Hepatic GERD  Controlled,(+) Hepatitis -, Unspecified  Endo/Other  negative endocrine ROS  Renal/GU negative Renal ROS  negative genitourinary   Musculoskeletal negative musculoskeletal ROS (+)   Abdominal   Peds negative pediatric ROS (+)  Hematology negative hematology ROS (+)   Anesthesia Other Findings Past Medical History:   Allergy                                                      NASH (nonalcoholic steatohepatitis)                          Chronic allergic rhinitis                                    GERD (gastroesophageal reflux disease)                      Past Surgical History:   WISDOM TOOTH EXTRACTION                                      BMI    Body Mass Index   28.69 kg/m 2      Reproductive/Obstetrics negative OB ROS                             Anesthesia Physical  Anesthesia Plan  ASA: II  Anesthesia Plan: General   Post-op Pain Management:    Induction: Intravenous  Airway Management Planned: Oral ETT  Additional Equipment:   Intra-op Plan:   Post-operative Plan: Extubation in OR  Informed Consent: I have reviewed the patients History and Physical, chart, labs and discussed the procedure including the risks, benefits and alternatives for the proposed  anesthesia with the patient or authorized representative who has indicated his/her understanding and acceptance.   Dental Advisory Given  Plan Discussed with: Anesthesiologist, CRNA and Surgeon  Anesthesia Plan Comments:         Anesthesia Quick Evaluation

## 2016-07-23 NOTE — Discharge Instructions (Addendum)
Take Tylenol or Norco if needed for pain. ° °Should not drive or do anything dangerous when taking Norco. ° °Remove dressings on Saturday.  May shower Sunday. ° °Avoid straining and heavy lifting for the first week after surgery. ° ° ° °AMBULATORY SURGERY  °DISCHARGE INSTRUCTIONS ° °1) The drugs that you were given will stay in your system until tomorrow so for the next 24 hours you should not: °A) Drive an automobile °B) Make any legal decisions °C) Drink any alcoholic beverage ° °2) You may resume regular meals tomorrow.  Today it is better to start with liquids and gradually work up to solid foods. °You may eat anything you prefer, but it is better to start with liquids, then soup and crackers, and gradually work up to solid foods. ° °3) Please notify your doctor immediately if you have any unusual bleeding, trouble breathing, redness and pain at the surgery site, drainage, fever, or pain not relieved by medication. ° °4) Additional Instructions: ° °Please contact your physician with any problems or Same Day Surgery at [phone], Monday through Friday 6 am to 4 pm, or [facility] number at [phone]. °

## 2016-07-23 NOTE — H&P (Signed)
He reports no change in condition since office exam.  Labs noted.  Discussed plan for lap cholecystectomy

## 2016-07-23 NOTE — Op Note (Signed)
OPERATIVE REPORT  PREOPERATIVE DIAGNOSIS:  Chronic cholecystitis cholelithiasis  POSTOPERATIVE DIAGNOSIS: Chronic cholecystitis cholelithiasis  PROCEDURE: Laparoscopic cholecystectomy with cholangiogram  ANESTHESIA: General  SURGEON: Rochel Brome M.D.  INDICATIONS: He has a history of epigastric pain and ultrasound findings of gallstones. Surgery was recommended for definitive treatment.  With the patient on the operating table in the supine position under general endotracheal anesthesia the abdomen was prepared with ChloraPrep solution and draped in a sterile manner. A short incision was made in the inferior aspect of the umbilicus and carried down to the deep fascia which was grasped with a laryngeal hook. A Veress needle was inserted aspirated and irrigated with a saline solution. The peritoneal cavity was insufflated with carbon dioxide. The Veress needle was removed. The 10 mm cannula was inserted. The 10 mm 0 laparoscope was inserted to view the peritoneal cavity.  Another incision was made in the epigastrium slightly to the right of the midline to introduce an 11 mm cannula. 2 incisions were made in the lateral aspect of the right upper quadrant to introduce 2   5 mm cannulas. Initial inspection revealed a smooth surface of the liver and typical appearance of visible intestines. The gallbladder was retracted towards the right shoulder.  The gallbladder neck was retracted inferiorly and laterally.  The porta hepatis was identified. The gallbladder was mobilized with incision of the visceral peritoneum. The cystic duct was dissected free from surrounding structures. The cystic artery was dissected free from surrounding structures. A critical view of safety was demonstrated  An Endo Clip was placed across the cystic duct adjacent to the gallbladder neck. An incision was made in the cystic duct to introduce a Reddick catheter. The cholangiogram was done with injection of half-strength Conray 60  dye. This demonstrated the bile ducts and flow of dye into the duodenum. No retained stones were identified. The cholangiogram appeared normal. The Reddick catheter was removed. The cystic duct was doubly ligated with endoclips and divided. The cystic artery was controlled with double endoclips and divided. The gallbladder was dissected free from the liver with use of hook and cautery and blunt dissection. There was some leakage of bile from the gallbladder which was aspirated. Bleeding was minimal and hemostasis was intact. The Endo Catch bag was inserted and the gallbladder was placed into a bag. The gallbladder was delivered up through the infraumbilical incision opened and suctioned.  Multiple stones were removed. Sclerae the gallbladder and stones were removed and submitted in formalin for routine pathology. The Endo Catch bag was removed as well. The right upper quadrant was further inspected and irrigated and aspirated and could see hemostasis was intact. The cannulas were removed. Carbon dioxide was allowed to escape from the peritoneal cavity. Several small subcutaneous bleeding points were cauterized. The skin incisions were closed with interrupted 5-0 chromic subcutaneous suture benzoin and Steri-Strips. Gauze dressings were applied with paper tape.  The patient appeared to be in satisfactory condition and was prepared for transfer to the recovery room  [place].D.

## 2016-07-23 NOTE — Transfer of Care (Signed)
Immediate Anesthesia Transfer of Care Note  Patient: Luis Khan  Procedure(s) Performed: Procedure(s): LAPAROSCOPIC CHOLECYSTECTOMY WITH INTRAOPERATIVE CHOLANGIOGRAM (N/A)  Patient Location: PACU  Anesthesia Type:General  Level of Consciousness: sedated  Airway & Oxygen Therapy: Patient Spontanous Breathing and Patient connected to face mask oxygen  Post-op Assessment: Report given to RN and Post -op Vital signs reviewed and stable  Post vital signs: Reviewed and stable  Last Vitals:  Vitals:   07/23/16 1233 07/23/16 1551  BP: (!) 141/83 (!) 150/86  Pulse: 74 81  Resp: 18 10  Temp: 36.9 C [id number] C    Complications: No apparent anesthesia complications

## 2016-07-23 NOTE — Anesthesia Postprocedure Evaluation (Signed)
Anesthesia Post Note  Patient: Luis Khan  Procedure(s) Performed: Procedure(s) (LRB): LAPAROSCOPIC CHOLECYSTECTOMY WITH INTRAOPERATIVE CHOLANGIOGRAM (N/A)  Patient location during evaluation: PACU Anesthesia Type: General Level of consciousness: awake and alert Pain management: pain level controlled Vital Signs Assessment: post-procedure vital signs reviewed and stable Respiratory status: spontaneous breathing and respiratory function stable Cardiovascular status: stable Anesthetic complications: no     Last Vitals:  Vitals:   07/23/16 1233 07/23/16 1551  BP: (!) 141/83 (!) 150/86  Pulse: 74 81  Resp: 18 10  Temp: 36.9 C 36.4 C    Last Pain:  Vitals:   07/23/16 1551  TempSrc:   PainSc: 10-Worst pain ever                 KEPHART,Arlee K

## 2016-07-26 ENCOUNTER — Encounter: Payer: Self-pay | Admitting: Surgery

## 2016-07-27 LAB — SURGICAL PATHOLOGY

## 2016-08-26 ENCOUNTER — Ambulatory Visit: Payer: Self-pay | Admitting: Medical

## 2016-08-27 ENCOUNTER — Ambulatory Visit: Payer: Self-pay | Admitting: Medical

## 2016-08-27 ENCOUNTER — Encounter: Payer: Self-pay | Admitting: Medical

## 2016-08-27 VITALS — BP 114/74 | HR 64 | Temp 97.4°F | Resp 16 | Ht 77.0 in | Wt 224.0 lb

## 2016-08-27 DIAGNOSIS — M546 Pain in thoracic spine: Secondary | ICD-10-CM
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
## 2016-09-07 ENCOUNTER — Other Ambulatory Visit: Payer: Self-pay

## 2016-09-09 ENCOUNTER — Ambulatory Visit: Payer: Self-pay | Admitting: Medical

## 2016-09-09 ENCOUNTER — Encounter: Payer: Self-pay | Admitting: Medical

## 2016-09-09 VITALS — BP 118/80 | HR 65 | Temp 97.3°F | Resp 16 | Ht 77.0 in | Wt 222.2 lb

## 2016-09-09 DIAGNOSIS — Z0000 Encounter for general adult medical examination without abnormal findings: Secondary | ICD-10-CM

## 2016-09-09 NOTE — Progress Notes (Signed)
   Subjective:    Patient ID: Luis Khan, male    DOB: [birth year], 31 y.o.   MRN: [id number]  HPI 31 yo male setting up primary care with clinic.Works at [employer] as Scientist, water quality.    Review of Systems  Constitutional: Negative.   HENT: Positive for nosebleeds.   Eyes: Negative.   Respiratory: Negative.   Cardiovascular: Negative.   Gastrointestinal: Negative.   Endocrine: Negative.   Genitourinary: Negative.   Musculoskeletal: Negative.   Skin: Negative.   Neurological: Negative.   Hematological: Negative.   Psychiatric/Behavioral: Negative.   nose bleeds with pollen or dry heat.     Objective:   Physical Exam  Constitutional: He is oriented to person, place, and time. He appears well-developed and well-nourished.  HENT:  Head: Normocephalic and atraumatic.  Right Ear: A middle ear effusion is present.  Left Ear: External ear normal. A middle ear effusion is present.  Nose: Nose normal.  Mouth/Throat: Oropharynx is clear and moist.  Eyes: Conjunctivae and EOM are normal. Pupils are equal, round, and reactive to light.  Neck: Normal range of motion. Neck supple. No thyromegaly present.  Cardiovascular: Normal rate and regular rhythm.  Exam reveals no gallop and no friction rub.   No murmur heard. Pulmonary/Chest: Effort normal and breath sounds normal.  Abdominal: Soft. Bowel sounds are normal. He exhibits no distension and no mass. There is tenderness. There is no rebound and no guarding.  Musculoskeletal: Normal range of motion.  Lymphadenopathy:    He has no cervical adenopathy.  Neurological: He is alert and oriented to person, place, and time.  Skin: Skin is warm and dry.  Psychiatric: He has a normal mood and affect. His behavior is normal.  Nursing note and vitals reviewed.  Mild tenderness epigastic and medial side of RUQ (recent laproscopic cholecystectomy 07/23/16) , incisions look idea. Noted bilateral flat feet, with pronation  of the feet, patient wears orthotics and a compression stocking on the right foot. Great toe nail on left foot deformed pt says he had 4 toenail removals as a kid and it grows out deformed and falls off.       Assessment & Plan:  Primary care exam. Labs reviewed with patient from Jul 07, 2016. Vitamin D level done today, will call patient with results and recommendations. Recommended  OTC Zyrtec or Claritin for Eustachian tube dysfunction bilateral. No complaints today. No recent check on Vitamin D level. Given printed last visits notes printed for his lawyer on clearance of mid back pain.  Called patient on  09/10/16 and reviewed Vitamin D level 42.7 he is currently taking  4000IU of vitamin D3 Recommended decrease Vitamin D3 to 2000IU/day . Recheck in one year.

## 2016-09-10 ENCOUNTER — Telehealth: Payer: Self-pay | Admitting: Medical

## 2016-09-10 LAB — VITAMIN D 25 HYDROXY (VIT D DEFICIENCY, FRACTURES): VIT D 25 HYDROXY: 42.7 ng/mL (ref 30.0–100.0)

## 2016-09-10 NOTE — Telephone Encounter (Signed)
called patient Vit D level 42.7 to decrease from 4000iu/day to 2000iu/day.    By Talmage Nap, PA-C

## 2016-09-27 ENCOUNTER — Encounter: Payer: Self-pay | Admitting: Medical

## 2016-09-29 ENCOUNTER — Encounter: Payer: Self-pay | Admitting: Medical

## 2016-09-30 ENCOUNTER — Telehealth: Payer: Self-pay | Admitting: Medical
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
## 2016-10-01 ENCOUNTER — Encounter: Payer: Self-pay | Admitting: Medical

## 2016-10-05 DIAGNOSIS — R1013 Epigastric pain: Secondary | ICD-10-CM | POA: Diagnosis not present

## 2016-12-03 ENCOUNTER — Telehealth: Payer: Self-pay | Admitting: Medical

## 2016-12-03 MED ORDER — PANTOPRAZOLE SODIUM 40 MG PO TBEC: 40.0000 mg | DELAYED_RELEASE_TABLET | Freq: Every day | ORAL | 1 refills | Status: DC

## 2016-12-03 NOTE — Telephone Encounter (Signed)
Patient at a retreat and needs refill of  protonix  40 mg tablets. Pharmacy had not yet set refill request. Will do   Protonix  40 mg  One po qd #90 days plus one refill. Still taking Ranitadine  Before dinner.

## 2017-01-24 ENCOUNTER — Other Ambulatory Visit: Payer: Self-pay | Admitting: Medical

## 2017-03-27 ENCOUNTER — Other Ambulatory Visit: Payer: Self-pay | Admitting: Medical

## 2017-03-28 ENCOUNTER — Other Ambulatory Visit: Payer: Self-pay | Admitting: Medical

## 2017-03-28 DIAGNOSIS — K219 Gastro-esophageal reflux disease without esophagitis: Secondary | ICD-10-CM

## 2017-03-28 MED ORDER — PANTOPRAZOLE SODIUM 40 MG PO TBEC: 40.0000 mg | DELAYED_RELEASE_TABLET | Freq: Every day | ORAL | 0 refills | Status: DC

## 2017-03-28 MED ORDER — PANTOPRAZOLE SODIUM 40 MG PO TBEC: 40.0000 mg | DELAYED_RELEASE_TABLET | Freq: Every day | ORAL | 1 refills | Status: DC

## 2017-03-28 NOTE — Telephone Encounter (Signed)
Already refilled, by refill tab.

## 2017-03-28 NOTE — Telephone Encounter (Signed)
Patient contacted office Marliss Coots) has only one pill left and leaving Tuesday for out of town trip. Would like refill on Protonix.  Meds ordered this encounter  Medications  . DISCONTD: pantoprazole (PROTONIX) 40 MG tablet    Sig: Take 1 tablet (40 mg total) by mouth daily.    Dispense:  90 tablet    Refill:  1  . pantoprazole (PROTONIX) 40 MG tablet    Sig: Take 1 tablet (40 mg total) by mouth daily.    Dispense:  90 tablet    Refill:  0   initially sent to wrong pharmacy. His pharmacy is [facility] in [place] ( which is now a Writer).

## 2017-04-27 ENCOUNTER — Ambulatory Visit: Payer: Self-pay | Admitting: Adult Health

## 2017-04-27 ENCOUNTER — Encounter: Payer: Self-pay | Admitting: Adult Health

## 2017-04-27 VITALS — BP 120/80 | HR 69 | Temp 98.0°F | Resp 16 | Wt 223.0 lb

## 2017-04-27 DIAGNOSIS — J0110 Acute frontal sinusitis, unspecified: Secondary | ICD-10-CM

## 2017-04-27 MED ORDER — AMOXICILLIN-POT CLAVULANATE 875-125 MG PO TABS: 1.0000 | ORAL_TABLET | Freq: Two times a day (BID) | ORAL | 0 refills | Status: DC

## 2017-04-27 NOTE — Progress Notes (Signed)
Subjective:     Patient ID: Luis Khan, male   DOB: August 23, 1985, 31 y.o.   MRN: [id number]  Patient is a 31 year old male who comes to the clinic in no acute distress. He reports last three weeks he has had increased nasal congestion, nasal dryness at times. Sore throat. Pressure in ears.  Ears " popping at times ".   Patient  denies any fever, chills, rash, chest pain, shortness of breath, nausea, vomiting, or diarrhea.  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   Sinusitis  This is a new problem. The current episode started 1 to 4 weeks ago. The problem is unchanged. There has been no fever. His pain is at a severity of 0/10. He is experiencing no pain. Associated symptoms include congestion, coughing (mild  post nasal drip ), headaches (mild forehead ), sinus pressure, sneezing and a sore throat (intermittent ). Pertinent negatives include no chills, diaphoresis, ear pain (pressure bilaterally), hoarse voice, neck pain, shortness of breath or swollen glands. Past treatments include saline sprays. The treatment provided mild relief.       Vitals:   04/27/17 0812  BP: 120/80  Pulse: 69  Resp: 16  Temp: 98 F (36.7 C)  SpO2: 98%    No Known Allergies    Patient Active Problem List   Diagnosis Date Noted  . Gastroesophageal reflux disease 04/09/2015     Current Outpatient Medications:  .  cyclobenzaprine (FLEXERIL) 5 MG tablet, Take 5 mg by mouth 2 (two) times daily as needed for muscle spasms., Disp: , Rfl:  .  fexofenadine (ALLEGRA) 180 MG tablet, Take 180 mg by mouth every morning. , Disp: , Rfl:  .  fluticasone (FLONASE) 50 MCG/ACT nasal spray, Place 1 spray into both nostrils 2 (two) times daily., Disp: , Rfl:  .  ketoconazole (NIZORAL) 2 % cream, Apply 1 application topically daily as needed (for psoriasis)., Disp: , Rfl:  .  Multiple Vitamin (MULTIVITAMIN WITH MINERALS) TABS tablet, Take 1 tablet by mouth daily. Men's One-A-Day, Disp: , Rfl:  .   pantoprazole (PROTONIX) 40 MG tablet, Take 1 tablet (40 mg total) by mouth daily., Disp: 90 tablet, Rfl: 0 .  ranitidine (ZANTAC) 150 MG capsule, Take 150 mg by mouth daily before supper. , Disp: , Rfl:  .  Cholecalciferol 400 units CHEW, Chew 1 tablet by mouth daily., Disp: , Rfl:  .  HYDROcodone-acetaminophen (NORCO) 5-325 MG tablet, Take 1-2 tablets by mouth every 4 (four) hours as needed for moderate pain. (Patient not taking: Reported on 08/27/2016), Disp: 12 tablet, Rfl: 0   Review of Systems  Constitutional: Negative for chills and diaphoresis.  HENT: Positive for congestion, sinus pressure, sneezing and sore throat (intermittent ). Negative for ear pain (pressure bilaterally) and hoarse voice.   Respiratory: Positive for cough (mild  post nasal drip ). Negative for shortness of breath.   Gastrointestinal: Negative.   Musculoskeletal: Negative for neck pain.  Skin: Negative.   Neurological: Positive for headaches (mild forehead ).  Psychiatric/Behavioral: Negative.        Objective:   Physical Exam  Constitutional: He is oriented to person, place, and time. He appears well-developed and well-nourished. No distress.  HENT:  Head: Normocephalic and atraumatic.  Right Ear: Hearing, external ear and ear canal normal. A middle ear effusion is present.  Left Ear: Hearing, external ear and ear canal normal. Tympanic membrane is erythematous (mild ). A middle ear effusion is present.  Nose:  Mucosal edema and rhinorrhea present.  Mouth/Throat: Uvula is midline, oropharynx is clear and moist and mucous membranes are normal. No uvula swelling. No oropharyngeal exudate or posterior oropharyngeal edema.  Eyes: Conjunctivae, EOM and lids are normal. Pupils are equal, round, and reactive to light. Right eye exhibits no discharge. Left eye exhibits no discharge. No scleral icterus.  Neck: Trachea normal, normal range of motion and full passive range of motion without pain. Neck supple. No JVD  present. No tracheal deviation present. No thyromegaly present.  Cardiovascular: Normal rate, regular rhythm, normal heart sounds and intact distal pulses. Exam reveals no gallop and no friction rub.  No murmur heard. Pulmonary/Chest: Effort normal and breath sounds normal. No stridor. No respiratory distress. He has no decreased breath sounds. He has no wheezes. He has no rales. He exhibits no tenderness.  Abdominal: Soft. Bowel sounds are normal. He exhibits no distension.  Musculoskeletal: Normal range of motion. He exhibits no edema, tenderness or deformity.  Lymphadenopathy:       Head (right side): Tonsillar (mild ) and posterior auricular (mild ) adenopathy present.       Head (left side): Tonsillar (mild ) and posterior auricular (mild ) adenopathy present.    He has no cervical adenopathy.  Neurological: He is alert and oriented to person, place, and time. He has normal strength and normal reflexes. He displays normal reflexes. No cranial nerve deficit or sensory deficit. He exhibits normal muscle tone. He displays a negative Romberg sign. Coordination normal.  Skin: Skin is warm, dry and intact. No rash noted. He is not diaphoretic. No erythema. No pallor.  Psychiatric: He has a normal mood and affect. His speech is normal and behavior is normal. Judgment and thought content normal. Cognition and memory are normal.  Vitals reviewed.      Assessment:     Acute frontal sinusitis, recurrence not specified     Plan:     Meds ordered this encounter  Medications  . amoxicillin-clavulanate (AUGMENTIN) 875-125 MG tablet    Sig: Take 1 tablet by mouth 2 (two) times daily.    Dispense:  20 tablet    Refill:  0   Continue nasal saline spray per package instructions.   May take Sudafed oover the counter per package instructions.  Advised to return to clinic for an appointment if no improvement within 72 hours or if any symptoms change or worsen. Advised ER or urgent Care if after  hours or on weekend. 911 for emergency symptoms at any time.   Patient verbalized understanding of all instructions given and denies any further questions at this time.

## 2017-04-27 NOTE — Patient Instructions (Signed)

## 2017-05-10 ENCOUNTER — Ambulatory Visit: Payer: Self-pay | Admitting: Medical

## 2017-05-10 ENCOUNTER — Encounter: Payer: Self-pay | Admitting: Medical

## 2017-05-10 VITALS — BP 128/88 | HR 89 | Temp 97.1°F | Resp 16 | Wt 229.0 lb

## 2017-05-10 DIAGNOSIS — B349 Viral infection, unspecified: Secondary | ICD-10-CM

## 2017-05-10 NOTE — Progress Notes (Signed)
Patient was seen in clinic before Thanksgiving saw [place] NP.  Given anitibotic felt better.  Wife recently had a cold and thinks that may be contributing.  Travelling later in the week.  Symtoms - congestion head and chest, cough, sinus pressure and ear pressure, head aches.  Subjective:    Patient ID: Luis Khan, male    DOB: [birth year], 31 y.o.   MRN: [id number]  HPI  31 yo male in non acute distress, seen in clinic on 04/27/17, treated with Augmentin.  Wife sick with a cold , on Saturday with drainage down the throat then Sunday  That resolved with scratchy throat, ear pressure bilaterally, yesterday felt bad, nasal congestion, now with cough non productive. No fever or chills. Low energy yesterday , better today. Using nettie pot mostly clear with bits of yellow. Going out of town to [place] , and wanted to be checked.  No antipyretics today.  Review of Systems  Constitutional: Negative for chills, fatigue and fever.  HENT: Positive for congestion, ear pain, postnasal drip, sinus pressure (under eyes. better today) and sneezing. Negative for rhinorrhea and sore throat.   Eyes: Negative for discharge and itching.  Respiratory: Positive for cough. Negative for chest tightness and shortness of breath.   Cardiovascular: Negative for chest pain, palpitations and leg swelling.  Gastrointestinal: Negative for abdominal pain.  Endocrine: Negative for polydipsia, polyphagia and polyuria.  Genitourinary: Negative for dysuria.  Musculoskeletal: Negative for myalgias.  Skin: Negative for rash.  Allergic/Immunologic: Positive for environmental allergies. Negative for food allergies.  Neurological: Positive for headaches (forehead). Negative for dizziness, syncope and light-headedness.  Hematological: Negative for adenopathy.  Psychiatric/Behavioral: Negative for behavioral problems, self-injury and suicidal ideas. The patient is not nervous/anxious.        Objective:   Physical Exam   Constitutional: He is oriented to person, place, and time. He appears well-developed and well-nourished.  HENT:  Head: Normocephalic and atraumatic.  Right Ear: Hearing, external ear and ear canal normal. A middle ear effusion is present.  Left Ear: Hearing, external ear and ear canal normal. A middle ear effusion is present.  Nose: Rhinorrhea present.  Mouth/Throat: Uvula is midline and oropharynx is clear and moist.  Eyes: Conjunctivae and EOM are normal. Pupils are equal, round, and reactive to light.  Neck: Normal range of motion. Neck supple.  Cardiovascular: Normal rate, regular rhythm and normal heart sounds.  Pulmonary/Chest: Effort normal and breath sounds normal. No respiratory distress. He has no wheezes. He has no rales.  Lymphadenopathy:    He has no cervical adenopathy.  Neurological: He is alert and oriented to person, place, and time.  Skin: Skin is warm and dry.  Psychiatric: He has a normal mood and affect. His behavior is normal. Judgment and thought content normal.  Nursing note and vitals reviewed.  Raw and bloody in nose bilateral and on the medial and lateral sides.       Assessment & Plan:   Viral infection. Rest , increase fluids, OTC Tylenol take as directed.  Stop Flonase for now.Avoid blowing nose hard. Return to clinic in 3-5 days if not improving or may call the office for antibiotics if symptoms worsen. Patient verbalizes understanding and has no questions at discharge.

## 2017-05-10 NOTE — Patient Instructions (Signed)
Viral   Viral Illness, Adult Viruses are tiny germs that can get into a person's body and cause illness. There are many different types of viruses, and they cause many types of illness. Viral illnesses can range from mild to severe. They can affect various parts of the body. Common illnesses that are caused by a virus include colds and the flu. Viral illnesses also include serious conditions such as HIV/AIDS (human immunodeficiency virus/acquired immunodeficiency syndrome). A few viruses have been linked to certain cancers. What are the causes? Many types of viruses can cause illness. Viruses invade cells in your body, multiply, and cause the infected cells to malfunction or die. When the cell dies, it releases more of the virus. When this happens, you develop symptoms of the illness, and the virus continues to spread to other cells. If the virus takes over the function of the cell, it can cause the cell to divide and grow out of control, as is the case when a virus causes cancer. Different viruses get into the body in different ways. You can get a virus by:  Swallowing food or water that is contaminated with the virus.  Breathing in droplets that have been coughed or sneezed into the air by an infected person.  Touching a surface that has been contaminated with the virus and then touching your eyes, nose, or mouth.  Being bitten by an insect or animal that carries the virus.  Having sexual contact with a person who is infected with the virus.  Being exposed to blood or fluids that contain the virus, either through an open cut or during a transfusion.  If a virus enters your body, your body's defense system (immune system) will try to fight the virus. You may be at higher risk for a viral illness if your immune system is weak. What are the signs or symptoms? Symptoms vary depending on the type of virus and the location of the cells that it invades. Common symptoms of the main types of viral  illnesses include: Cold and flu viruses  Fever.  Headache.  Sore throat.  Muscle aches.  Nasal congestion.  Cough. Digestive system (gastrointestinal) viruses  Fever.  Abdominal pain.  Nausea.  Diarrhea. Liver viruses (hepatitis)  Loss of appetite.  Tiredness.  Yellowing of the skin (jaundice). Brain and spinal cord viruses  Fever.  Headache.  Stiff neck.  Nausea and vomiting.  Confusion or sleepiness. Skin viruses  Warts.  Itching.  Rash. Sexually transmitted viruses  Discharge.  Swelling.  Redness.  Rash. How is this treated? Viruses can be difficult to treat because they live within cells. Antibiotic medicines do not treat viruses because these drugs do not get inside cells. Treatment for a viral illness may include:  Resting and drinking plenty of fluids.  Medicines to relieve symptoms. These can include over-the-counter medicine for pain and fever, medicines for cough or congestion, and medicines to relieve diarrhea.  Antiviral medicines. These drugs are available only for certain types of viruses. They may help reduce flu symptoms if taken early. There are also many antiviral medicines for hepatitis and HIV/AIDS.  Some viral illnesses can be prevented with vaccinations. A common example is the flu shot. Follow these instructions at home: Medicines   Take over-the-counter and prescription medicines only as told by your health care provider.  If you were prescribed an antiviral medicine, take it as told by your health care provider. Do not stop taking the medicine even if you start to feel better.  Be aware of when antibiotics are needed and when they are not needed. Antibiotics do not treat viruses. If your health care provider thinks that you may have a bacterial infection as well as a viral infection, you may get an antibiotic. ? Do not ask for an antibiotic prescription if you have been diagnosed with a viral illness. That will not  make your illness go away faster. ? Frequently taking antibiotics when they are not needed can lead to antibiotic resistance. When this develops, the medicine no longer works against the bacteria that it normally fights. General instructions  Drink enough fluids to keep your urine clear or pale yellow.  Rest as much as possible.  Return to your normal activities as told by your health care provider. Ask your health care provider what activities are safe for you.  Keep all follow-up visits as told by your health care provider. This is important. How is this prevented? Take these actions to reduce your risk of viral infection:  Eat a healthy diet and get enough rest.  Wash your hands often with soap and water. This is especially important when you are in public places. If soap and water are not available, use hand sanitizer.  Avoid close contact with friends and family who have a viral illness.  If you travel to areas where viral gastrointestinal infection is common, avoid drinking water or eating raw food.  Keep your immunizations up to date. Get a flu shot every year as told by your health care provider.  Do not share toothbrushes, nail clippers, razors, or needles with other people.  Always practice safe sex.  Contact a health care provider if:  You have symptoms of a viral illness that do not go away.  Your symptoms come back after going away.  Your symptoms get worse. Get help right away if:  You have trouble breathing.  You have a severe headache or a stiff neck.  You have severe vomiting or abdominal pain. This information is not intended to replace advice given to you by your health care provider. Make sure you discuss any questions you have with your health care provider. Document Released: 10/03/2015 Document Revised: 11/05/2015 Document Reviewed: 10/03/2015 Elsevier Interactive Patient Education  [employer].

## 2017-06-12 DIAGNOSIS — J020 Streptococcal pharyngitis: Secondary | ICD-10-CM | POA: Diagnosis not present

## 2017-08-23 ENCOUNTER — Ambulatory Visit: Payer: Self-pay | Admitting: Medical

## 2017-08-23 VITALS — BP 125/91 | HR 74 | Temp 97.3°F | Resp 16 | Ht 77.0 in | Wt 235.2 lb

## 2017-08-23 DIAGNOSIS — J9801 Acute bronchospasm: Secondary | ICD-10-CM

## 2017-08-23 DIAGNOSIS — R05 Cough: Secondary | ICD-10-CM

## 2017-08-23 DIAGNOSIS — R059 Cough, unspecified: Secondary | ICD-10-CM

## 2017-08-23 DIAGNOSIS — J069 Acute upper respiratory infection, unspecified: Secondary | ICD-10-CM

## 2017-08-23 MED ORDER — BENZONATATE 100 MG PO CAPS: ORAL_CAPSULE | ORAL | 0 refills | Status: DC

## 2017-08-23 MED ORDER — ALBUTEROL SULFATE HFA 108 (90 BASE) MCG/ACT IN AERS: 2.0000 | INHALATION_SPRAY | Freq: Four times a day (QID) | RESPIRATORY_TRACT | 0 refills | Status: DC | PRN

## 2017-08-23 MED ORDER — AZITHROMYCIN 250 MG PO TABS
ORAL_TABLET | ORAL | 0 refills | Status: DC
Start: 2017-08-23 — End: 2018-07-14

## 2017-08-23 NOTE — Patient Instructions (Signed)
Bronchospasm, Adult Bronchospasm is when airways in the lungs get smaller. When this happens, it can be hard to breathe. You may cough. You may also make a whistling sound when you breathe (wheeze). Follow these instructions at home: Medicines  Take over-the-counter and prescription medicines only as told by your doctor.  If you need to use an inhaler or nebulizer to take your medicine, ask your doctor how to use it.  If you were given a spacer, always use it with your inhaler. Lifestyle  Change your heating and air conditioning filter. Do this at least once a month.  Try not to use fireplaces and wood stoves.  Do not  smoke. Do not  allow smoking in your home.  Try not to use things that have a strong smell, like perfume.  Get rid of pests (such as roaches and mice) and their poop.  Remove any mold from your home.  Keep your house clean. Get rid of dust.  Use cleaning products that have no smell.  Replace carpet with wood, tile, or vinyl flooring.  Use allergy-proof pillows, mattress covers, and box spring covers.  Wash bed sheets and blankets every week. Use hot water. Dry them in a dryer.  Use blankets that are made of polyester or cotton.  Wash your hands often.  Keep pets out of your bedroom.  When you exercise, try not to breathe in cold air. General instructions  Have a plan for getting medical care. Know these things: ? When to call your doctor. ? When to call local emergency services (911 in the [place] ? Where to go in an emergency.  Stay up to date on your shots (immunizations).  When you have an episode: ? Stay calm. ? Relax. ? Breathe slowly. Contact a doctor if:  Your muscles ache.  Your chest hurts.  The color of the mucus you cough up (sputum) changes from clear or white to yellow, green, gray, or bloody.  The mucus you cough up gets thicker.  You have a fever. Get help right away if:  The whistling sound gets worse, even after you  take your medicines.  Your coughing gets worse.  You find it even harder to breathe.  Your chest hurts very much. Summary  Bronchospasm is when airways in the lungs get smaller.  When this happens, it can be hard to breathe. You may cough. You may also make a whistling sound when you breathe.  Stay away from things that cause you to have episodes. These include smoke or dust. This information is not intended to replace advice given to you by your health care provider. Make sure you discuss any questions you have with your health care provider. Document Released: 03/21/2009 Document Revised: 05/27/2016 Document Reviewed: 05/27/2016 Elsevier Interactive Patient Education  2017 [employer] Cough, Adult A cough helps to clear your throat and lungs. A cough may last only 2-3 weeks (acute), or it may last longer than 8 weeks (chronic). Many different things can cause a cough. A cough may be a sign of an illness or another medical condition. Follow these instructions at home:  Pay attention to any changes in your cough.  Take medicines only as told by your doctor. ? If you were prescribed an antibiotic medicine, take it as told by your doctor. Do not stop taking it even if you start to feel better. ? Talk with your doctor before you try using a cough medicine.  Drink enough fluid to keep your pee (urine)   clear or pale yellow.  If the air is dry, use a cold steam vaporizer or humidifier in your home.  Stay away from things that make you cough at work or at home.  If your cough is worse at night, try using extra pillows to raise your head up higher while you sleep.  Do not smoke, and try not to be around smoke. If you need help quitting, ask your doctor.  Do not have caffeine.  Do not drink alcohol.  Rest as needed. Contact a doctor if:  You have new problems (symptoms).  You cough up yellow fluid (pus).  Your cough does not get better after 2-3 weeks, or your cough gets  worse.  Medicine does not help your cough and you are not sleeping well.  You have pain that gets worse or pain that is not helped with medicine.  You have a fever.  You are losing weight and you do not know why.  You have night sweats. Get help right away if:  You cough up blood.  You have trouble breathing.  Your heartbeat is very fast. This information is not intended to replace advice given to you by your health care provider. Make sure you discuss any questions you have with your health care provider. Document Released: 02/04/2011 Document Revised: 10/30/2015 Document Reviewed: 07/31/2014 Elsevier Interactive Patient Education  2018 [employer]. Upper Respiratory Infection, Adult Most upper respiratory infections (URIs) are caused by a virus. A URI affects the nose, throat, and upper air passages. The most common type of URI is often called "the common cold." Follow these instructions at home:  Take medicines only as told by your doctor.  Gargle warm saltwater or take cough drops to comfort your throat as told by your doctor.  Use a warm mist humidifier or inhale steam from a shower to increase air moisture. This may make it easier to breathe.  Drink enough fluid to keep your pee (urine) clear or pale yellow.  Eat soups and other clear broths.  Have a healthy diet.  Rest as needed.  Go back to work when your fever is gone or your doctor says it is okay. ? You may need to stay home longer to avoid giving your URI to others. ? You can also wear a face mask and wash your hands often to prevent spread of the virus.  Use your inhaler more if you have asthma.  Do not use any tobacco products, including cigarettes, chewing tobacco, or electronic cigarettes. If you need help quitting, ask your doctor. Contact a doctor if:  You are getting worse, not better.  Your symptoms are not helped by medicine.  You have chills.  You are getting more short of breath.  You  have brown or red mucus.  You have yellow or brown discharge from your nose.  You have pain in your face, especially when you bend forward.  You have a fever.  You have puffy (swollen) neck glands.  You have pain while swallowing.  You have white areas in the back of your throat. Get help right away if:  You have very bad or constant: ? Headache. ? Ear pain. ? Pain in your forehead, behind your eyes, and over your cheekbones (sinus pain). ? Chest pain.  You have long-lasting (chronic) lung disease and any of the following: ? Wheezing. ? Long-lasting cough. ? Coughing up blood. ? A change in your usual mucus.  You have a stiff neck.  You have changes   in your: ? Vision. ? Hearing. ? Thinking. ? Mood. This information is not intended to replace advice given to you by your health care provider. Make sure you discuss any questions you have with your health care provider. Document Released: 11/10/2007 Document Revised: 01/25/2016 Document Reviewed: 08/29/2013 Elsevier Interactive Patient Education  2018 Elsevier Inc.  

## 2017-08-23 NOTE — Progress Notes (Signed)
   Subjective:    Patient ID: Luis Khan, male    DOB: 01/26/1986, 32 y.o.   MRN: [id number]  HPI 32 yo male in non acute distress. Last Tuesday 08/16/17 started with allergy symptoms. Friday with sore throat and  Laryngtiis, Saturday felt better in the morning , then in the afternoon cough  worsening, non productive. Ear pressure, both. Denies fever and chills. Denies shortness of breath or chest pain. Has heard some wheezing at times. Denies body aches Did get the Influenza vaccine.  Blood pressure (!) 125/91, pulse 74, temperature (!) 97.3 F (36.3 C), temperature source Tympanic, resp. rate 16, height 6\' 5"  (1.956 m), weight 235 lb 3.2 oz (106.7 kg), SpO2 100 %. Review of Systems  Constitutional: Negative for appetite change, chills, fatigue and fever.  HENT: Positive for postnasal drip, sinus pressure and sore throat (initially). Negative for congestion, ear pain, rhinorrhea and sinus pain.   Eyes: Negative for discharge and itching.  Respiratory: Positive for cough and wheezing. Negative for chest tightness and shortness of breath.   Cardiovascular: Negative for chest pain.  Gastrointestinal: Negative for abdominal pain.  Genitourinary: Negative for dysuria.  Musculoskeletal: Negative for myalgias.  Skin: Negative for rash.  Allergic/Immunologic: Positive for environmental allergies. Negative for food allergies.  Neurological: Negative for dizziness, syncope, light-headedness and headaches.  Hematological: Negative for adenopathy.  Psychiatric/Behavioral: Negative for behavioral problems, self-injury and suicidal ideas. The patient is not nervous/anxious.        Objective:   Physical Exam  Constitutional: He is oriented to person, place, and time. He appears well-developed and well-nourished.  HENT:  Head: Normocephalic and atraumatic.  Right Ear: External ear normal.  Left Ear: External ear normal.  Eyes: Conjunctivae and EOM are normal. Pupils are equal, round, and  reactive to light.  Neck: Normal range of motion. Neck supple.  Cardiovascular: Normal rate, regular rhythm and normal heart sounds.  Pulmonary/Chest: Effort normal and breath sounds normal. No respiratory distress. He has no wheezes. He has no rales.  Musculoskeletal: Normal range of motion.  Lymphadenopathy:    He has no cervical adenopathy.  Neurological: He is alert and oriented to person, place, and time.  Skin: Skin is warm and dry.  Psychiatric: He has a normal mood and affect. His behavior is normal. Judgment and thought content normal.  Nursing note and vitals reviewed.    Able to speak in sentences, cough noted in room. End of cough slight wheeze.    Assessment & Plan:  Upper Respiratory Infection, Bronchospasm, most likely viral/ cough Meds ordered this encounter  Medications  . benzonatate (TESSALON PERLES) 100 MG capsule    Sig: Take  1-2 perles every 8 hours , swallow whole as needed for cough.    Dispense:  30 capsule    Refill:  0  . albuterol (PROVENTIL HFA;VENTOLIN HFA) 108 (90 Base) MCG/ACT inhaler    Sig: Inhale 2 puffs into the lungs every 6 (six) hours as needed for wheezing or shortness of breath.    Dispense:  1 Inhaler    Refill:  0  . azithromycin (ZITHROMAX) 250 MG tablet    Sig: Take 2 tablets Day 1, then 1 tablet days 2-5 , take with food.    Dispense:  6 tablet    Refill:  0  Follow up in 3-5 days if not improving and if needing to start antibiotic. Reviewed signs and symptoms of when to start the antibiotics. Patient verbalizes understanding and has no questions at discharge.

## 2017-11-09 IMAGING — CR DG CHOLANGIOGRAM OPERATIVE
1 series · 1 of 1 positions shown · non-contrast
Comparison: Ultrasound 07/09/2016

CLINICAL DATA: 30-year-old male with cholelithiasis

EXAM:
INTRAOPERATIVE CHOLANGIOGRAM
TECHNIQUE: Cholangiographic images from the C-arm fluoroscopic device were
submitted for interpretation post-operatively. Please see the
procedural report for the amount of contrast and the fluoroscopy
time utilized.

[1]
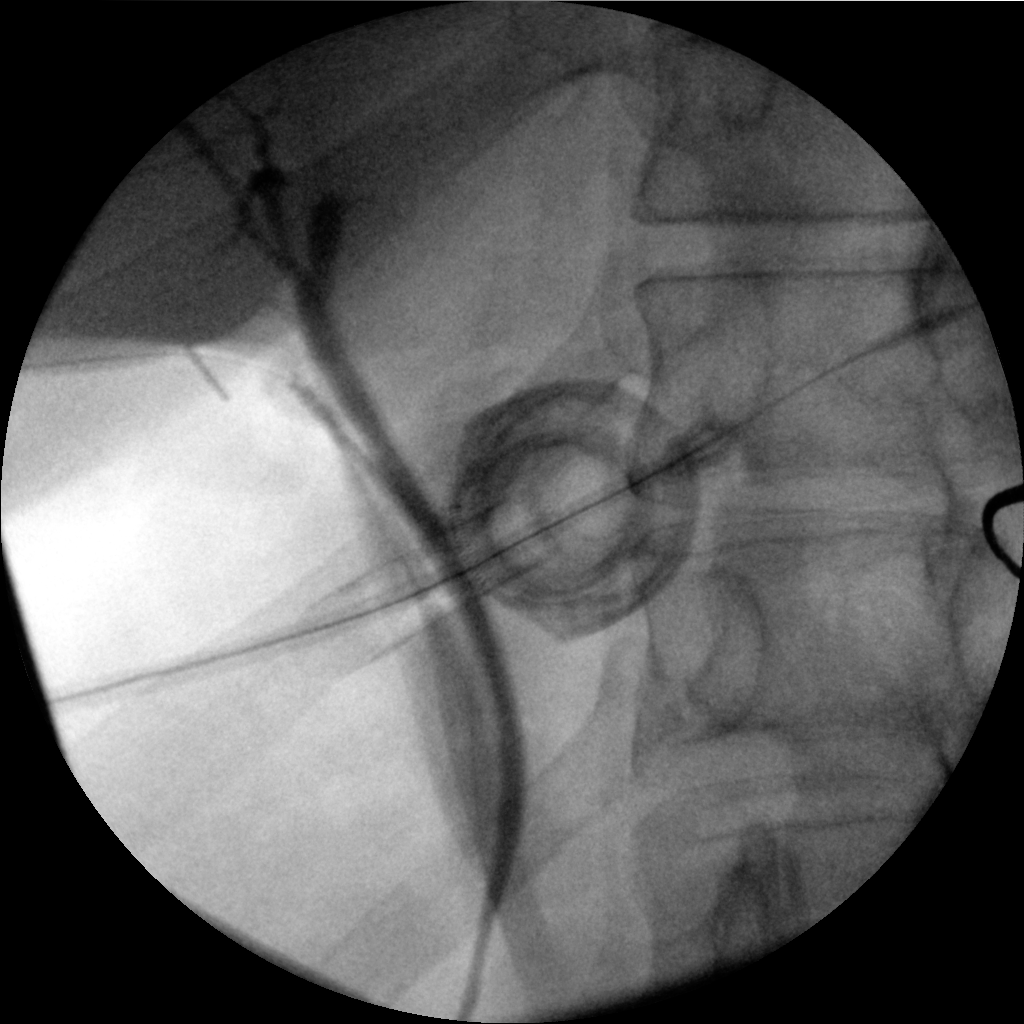

[1 of 1 positions shown; findings below may reference images not displayed]

FINDINGS: Surgical instruments project over the upper abdomen.

There is cannulation of the cystic duct/gallbladder neck, with
antegrade infusion of contrast. Caliber of the extrahepatic ductal
system within normal limits.

No large filling defect identified.

Free flow of contrast across the ampulla.
IMPRESSION: Intraoperative cholangiogram demonstrates extrahepatic biliary ducts
of unremarkable caliber, with no large filling defect identified.
Free flow of contrast across the ampulla.

Please refer to the dictated operative report for full details of
intraoperative findings and procedure

## 2017-12-14 ENCOUNTER — Other Ambulatory Visit: Payer: Self-pay | Admitting: Medical

## 2017-12-14 DIAGNOSIS — K219 Gastro-esophageal reflux disease without esophagitis: Secondary | ICD-10-CM

## 2017-12-14 MED ORDER — PANTOPRAZOLE SODIUM 40 MG PO TBEC: 40.0000 mg | DELAYED_RELEASE_TABLET | Freq: Every day | ORAL | 1 refills | Status: DC

## 2017-12-14 NOTE — Progress Notes (Signed)
Patient called for refill of Protonix.  Given  90 day  with  1 refill.

## 2018-01-30 ENCOUNTER — Other Ambulatory Visit: Payer: Self-pay | Admitting: Medical

## 2018-01-30 DIAGNOSIS — Z0000 Encounter for general adult medical examination without abnormal findings: Secondary | ICD-10-CM

## 2018-01-30 NOTE — Progress Notes (Signed)
Patient having a baby girl in  November 2019. Need to get Tdap updated for pertussis. Order placed in [place].

## 2018-01-31 ENCOUNTER — Ambulatory Visit: Payer: Self-pay

## 2018-01-31 DIAGNOSIS — Z0000 Encounter for general adult medical examination without abnormal findings: Secondary | ICD-10-CM

## 2018-03-02 ENCOUNTER — Ambulatory Visit: Payer: Self-pay

## 2018-05-19 ENCOUNTER — Encounter: Payer: Self-pay | Admitting: Medical

## 2018-06-13 ENCOUNTER — Encounter: Payer: Self-pay | Admitting: Medical

## 2018-06-13 DIAGNOSIS — K219 Gastro-esophageal reflux disease without esophagitis: Secondary | ICD-10-CM

## 2018-06-16 MED ORDER — PANTOPRAZOLE SODIUM 40 MG PO TBEC: 40.0000 mg | DELAYED_RELEASE_TABLET | Freq: Every day | ORAL | 1 refills | Status: DC

## 2018-06-16 NOTE — Telephone Encounter (Signed)
Refilled protonix DR 40mg  po daily #90 RF1 for patient as filling in for PA Ratcliffe today out sick.  EGD 2017 gastritis, duodenitis and reflux esophagitis was to continue medications per GI last labs 2017 consider magnesium level.  Last saw GI 2018 s/p cholecystectomy Dr Tamala Julian  Follow up was due in 2019 with GI patient to schedule.

## 2018-07-14 ENCOUNTER — Ambulatory Visit: Payer: Self-pay | Admitting: Nurse Practitioner

## 2018-07-14 ENCOUNTER — Encounter: Payer: Self-pay | Admitting: Nurse Practitioner

## 2018-07-14 VITALS — BP 128/78 | HR 96 | Temp 97.8°F

## 2018-07-14 DIAGNOSIS — H00011 Hordeolum externum right upper eyelid: Secondary | ICD-10-CM

## 2018-07-14 MED ORDER — ERYTHROMYCIN 5 MG/GM OP OINT
1.0000 | TOPICAL_OINTMENT | Freq: Three times a day (TID) | OPHTHALMIC | 0 refills | Status: AC
Start: 2018-07-14 — End: 2018-07-21

## 2018-07-14 NOTE — Patient Instructions (Addendum)
Warm compress to affected eye 2-3 times a day Take eye antibiotic ointment as directed Wash hands before and after administration of eye med Return to the clinic if symptoms persist or worsen

## 2018-07-14 NOTE — Progress Notes (Signed)
   Subjective:    Patient ID: Luis Khan, male    DOB: [birth year], 32 y.o.   MRN: [id number]  HPI  Will comes to the clinic with c/o right eye redness, swelling and painful since yesterday. He denies waking up with this but over the course of the day he noticed the right eye concerns. He denies any eye discharge, visual disturbances.    Review of Systems  Eyes: Negative for photophobia, pain, discharge, itching and visual disturbance.       Right upper eyelid pain       Objective:   Physical Exam Constitutional:      Appearance: Normal appearance.  HENT:     Head: Normocephalic and atraumatic.  Eyes:     General:        Right eye: No discharge.        Left eye: No discharge.     Extraocular Movements: Extraocular movements intact.     Conjunctiva/sclera: Conjunctivae normal.     Pupils: Pupils are equal, round, and reactive to light.     Comments: Right upper lid with edema, erythema and tenderness on palpation. Noted lesion to mid upper lid. No crustations or discharge noted.  Neurological:     Mental Status: He is alert.  Psychiatric:        Mood and Affect: Mood normal.           Assessment & Plan:

## 2018-07-17 ENCOUNTER — Ambulatory Visit: Payer: Self-pay | Admitting: Medical

## 2018-07-17 ENCOUNTER — Encounter: Payer: Self-pay | Admitting: Medical

## 2018-07-17 VITALS — BP 140/87 | HR 58 | Temp 97.3°F | Resp 16

## 2018-07-17 DIAGNOSIS — H00021 Hordeolum internum right upper eyelid: Secondary | ICD-10-CM | POA: Diagnosis not present

## 2018-07-17 DIAGNOSIS — L089 Local infection of the skin and subcutaneous tissue, unspecified: Secondary | ICD-10-CM

## 2018-07-17 DIAGNOSIS — H00011 Hordeolum externum right upper eyelid: Secondary | ICD-10-CM

## 2018-07-17 MED ORDER — CEPHALEXIN 500 MG PO CAPS
500.0000 mg | ORAL_CAPSULE | Freq: Four times a day (QID) | ORAL | 0 refills | Status: DC
Start: 2018-07-17 — End: 2019-01-02

## 2018-07-17 NOTE — Progress Notes (Signed)
   Subjective:    Patient ID: Luis Khan, male    DOB: 1986/04/16, 33 y.o.   MRN: [id number]  HPI  33 yo male in non acute distress.Seen on 07/14/2018 treated with  Erythromycin ophthalmic ointment Saturday worse with swelling on upper right lid. Less pain but more swelling Doing warm compresses 3 -4 times per day.  Visual change on the right eye  Started  Saturday. Sensitive to light.  Review of Systems  Constitutional: Negative for chills and fever.  Eyes: Positive for photophobia, pain, redness, itching (sometimes) and visual disturbance. Negative for discharge.   Painful with trying to focus.    Objective:   Physical Exam Eyes:     General: Gaze aligned appropriately.        Right eye: Hordeolum present.     Extraocular Movements: Extraocular movements intact.     Conjunctiva/sclera:     Right eye: Right conjunctiva is injected.       Top of upper lid red  And swollen (see diagram) red area on diagram appears to be a sty.  visual acutiy 20/30 left 20/60 right 20/30 bilateral Sclera injected, no discharge. Perrla,  EOMI    Assessment & Plan:  Sty right upper lid Visual change/ skin infection MyChart info. Celllulitis and Sty info sent to paitent. May use cool compresses to help with swelling.  He does not take Ibuprofen or NSIDS due to gastritis. We made him an appointment 4pm with Dr. Matilde Sprang today to check eyeball since new onset visual change with the right eye. Given address and phone number of doctor. Patient feels he may have gotten shampoo in his eye yesterday with showering , it did cause stinging of the eye. Patient verbalizes understanding and has no questions at discharge.

## 2018-07-17 NOTE — Patient Instructions (Signed)
Cellulitis, Adult  Cellulitis is a skin infection. The infected area is often warm, red, swollen, and sore. It occurs most often in the arms and lower legs. It is very important to get treated for this condition. What are the causes? This condition is caused by bacteria. The bacteria enter through a break in the skin, such as a cut, burn, insect bite, open sore, or crack. What increases the risk? This condition is more likely to occur in people who:  Have a weak body defense system (immune system).  Have open cuts, burns, bites, or scrapes on the skin.  Are older than 34 years of age.  Have a blood sugar problem (diabetes).  Have a long-lasting (chronic) liver disease (cirrhosis) or kidney disease.  Are very overweight (obese).  Have a skin problem, such as: ? Itchy rash (eczema). ? Slow movement of blood in the veins (venous stasis). ? Fluid buildup below the skin (edema).  Have been treated with high-energy rays (radiation).  Use IV drugs. What are the signs or symptoms? Symptoms of this condition include:  Skin that is: ? Red. ? Streaking. ? Spotting. ? Swollen. ? Sore or painful when you touch it. ? Warm.  A fever.  Chills.  Blisters. How is this diagnosed? This condition is diagnosed based on:  Medical history.  Physical exam.  Blood tests.  Imaging tests. How is this treated? Treatment for this condition may include:  Medicines to treat infections or allergies.  Home care, such as: ? Rest. ? Placing cold or warm cloths (compresses) on the skin.  Hospital care, if the condition is very bad. Follow these instructions at home: Medicines  Take over-the-counter and prescription medicines only as told by your doctor.  If you were prescribed an antibiotic medicine, take it as told by your doctor. Do not stop taking it even if you start to feel better. General instructions   Drink enough fluid to keep your pee (urine) pale yellow.  Do not touch  or rub the infected area.  Raise (elevate) the infected area above the level of your heart while you are sitting or lying down.  Place cold or warm cloths on the area as told by your doctor.  Keep all follow-up visits as told by your doctor. This is important. Contact a doctor if:  You have a fever.  You do not start to get better after 1-2 days of treatment.  Your bone or joint under the infected area starts to hurt after the skin has healed.  Your infection comes back. This can happen in the same area or another area.  You have a swollen bump in the area.  You have new symptoms.  You feel ill and have muscle aches and pains. Get help right away if:  Your symptoms get worse.  You feel very sleepy.  You throw up (vomit) or have watery poop (diarrhea) for a long time.  You see red streaks coming from the area.  Your red area gets larger.  Your red area turns dark in color. These symptoms may represent a serious problem that is an emergency. Do not wait to see if the symptoms will go away. Get medical help right away. Call your local emergency services (911 in the [place] Do not drive yourself to the hospital. Summary  Cellulitis is a skin infection. The area is often warm, red, swollen, and sore.  This condition is treated with medicines, rest, and cold and warm cloths.  Take all medicines only   as told by your doctor.  Tell your doctor if symptoms do not start to get better after 1-2 days of treatment. This information is not intended to replace advice given to you by your health care provider. Make sure you discuss any questions you have with your health care provider. Document Released: 11/10/2007 Document Revised: 10/13/2017 Document Reviewed: 10/13/2017 Elsevier Interactive Patient Education  2019 [place], also known as a hordeolum, is a bump that forms on an eyelid. It may look like a pimple next to the eyelash. A stye can form inside the  eyelid (internal stye) or outside the eyelid (external stye). A stye can cause redness, swelling, and pain on the eyelid. Styes are very common. Anyone can get them at any age. They usually occur in just one eye, but you may have more than one in either eye. What are the causes? A stye is caused by an infection. The infection is almost always caused by bacteria called Staphylococcus aureus. This is a common type of bacteria that lives on the skin. An internal stye may result from an infected oil-producing gland inside the eyelid. An external stye may be caused by an infection at the base of the eyelash (hair follicle). What increases the risk? You are more likely to develop a stye if:  You have had a stye before.  You have any of these conditions: ? Diabetes. ? Red, itchy, inflamed eyelids (blepharitis). ? A skin condition such as seborrheic dermatitis or rosacea. ? High fat levels in your blood (lipids). What are the signs or symptoms? The most common symptom of a stye is eyelid pain. Internal styes are more painful than external styes. Other symptoms may include:  Painful swelling of your eyelid.  A scratchy feeling in your eye.  Tearing and redness of your eye.  Pus draining from the stye. How is this diagnosed? Your health care provider may be able to diagnose a stye just by examining your eye. The health care provider may also check to make sure:  You do not have a fever or other signs of a more serious infection.  The infection has not spread to other parts of your eye or areas around your eye. How is this treated? Most styes will clear up in a few days without treatment or with warm compresses applied to the area. You may need to use antibiotic drops or ointment to treat an infection. In some cases, if your stye does not heal with routine treatment, your health care provider may drain pus from the stye using a thin blade or needle. This may be done if the stye is large,  causing a lot of pain, or affecting your vision. Follow these instructions at home:  Take over-the-counter and prescription medicines only as told by your health care provider. This includes eye drops or ointments.  If you were prescribed an antibiotic medicine, apply or use it as told by your health care provider. Do not stop using the antibiotic even if your condition improves.  Apply a warm, wet cloth (warm compress) to your eye for 5-10 minutes, 4 times a day.  Clean the affected eyelid as directed by your health care provider.  Do not wear contact lenses or eye makeup until your stye has healed.  Do not try to pop or drain the stye.  Do not rub your eye. Contact a health care provider if:  You have chills or a fever.  Your stye does not go  away after several days.  Your stye affects your vision.  Your eyeball becomes swollen, red, or painful. Get help right away if:  You have pain when moving your eye around. Summary  A stye is a bump that forms on an eyelid. It may look like a pimple next to the eyelash.  A stye can form inside the eyelid (internal stye) or outside the eyelid (external stye). A stye can cause redness, swelling, and pain on the eyelid.  Your health care provider may be able to diagnose a stye just by examining your eye.  Apply a warm, wet cloth (warm compress) to your eye for 5-10 minutes, 4 times a day. This information is not intended to replace advice given to you by your health care provider. Make sure you discuss any questions you have with your health care provider. Document Released: 03/03/2005 Document Revised: 02/03/2017 Document Reviewed: 02/03/2017 Elsevier Interactive Patient Education  2019 [employer].

## 2018-07-19 ENCOUNTER — Ambulatory Visit: Payer: Self-pay | Admitting: Nurse Practitioner

## 2018-07-19 ENCOUNTER — Encounter: Payer: Self-pay | Admitting: Nurse Practitioner

## 2018-07-19 VITALS — BP 127/93 | HR 70 | Temp 98.5°F | Resp 16

## 2018-07-19 DIAGNOSIS — H00011 Hordeolum externum right upper eyelid: Secondary | ICD-10-CM | POA: Insufficient documentation

## 2018-07-19 DIAGNOSIS — L089 Local infection of the skin and subcutaneous tissue, unspecified: Secondary | ICD-10-CM

## 2018-07-19 NOTE — Progress Notes (Signed)
   Subjective:    Patient ID: Luis Khan, male    DOB: [birth year], 33 y.o.   MRN: [id number]  HPI Will is here to f/u today. He was found to have hordeolum of right upper eyelid 07/14/2018. He returned to the clinic 07/17/2018 with worsening and found cellulitis with visual disturbances. He was sent to see opthamology the same day and Keflex was added to erythromycin ointment in office; which he continues but Dr. Matilde Sprang has adjusted to BID instead of QID. Dr. Matilde Sprang added triple abx eye gtts. Will reports he feels much better, with no visual disturbances, and symptoms improving and eye lid completely opens now. Continues to take meds as directed and tolerating. Denies visual disturbances, eye discharges or fever.  Review of Systems  Constitutional: Negative for fatigue and fever.  Eyes: Positive for redness. Negative for photophobia, pain, discharge and visual disturbance.       Eyelid itching  Gastrointestinal: Negative for diarrhea, nausea and vomiting.  Skin: Positive for color change.       Upper right eyelid red but improved       Objective:   Physical Exam Constitutional:      Appearance: Normal appearance.  HENT:     Head: Normocephalic.  Eyes:     Extraocular Movements: Extraocular movements intact.     Pupils: Pupils are equal, round, and reactive to light.     Comments: Mildly injected sclera bilaterally. Upper right eyelid with moderate erythema and edema. No eye discharge, no sensitivity to light on eye exam.  Neck:     Musculoskeletal: Normal range of motion and neck supple.  Neurological:     Mental Status: He is alert.           Assessment & Plan:

## 2018-07-19 NOTE — Patient Instructions (Addendum)
Will your eye appears much improved from the nurse's description and the provider you saw 2 days ago note that I reviewed.  Please continue the current regimen and if no completely resolved or worsening after the course of treatment please return to the clinic Continue good hand hygiene and please try to avoid touching your eyes You can also continue rotating with cold/warm compresses Return to the clinic as needed

## 2018-11-18 ENCOUNTER — Encounter: Payer: Self-pay | Admitting: Medical

## 2018-12-13 ENCOUNTER — Encounter: Payer: Self-pay | Admitting: Medical

## 2018-12-13 ENCOUNTER — Other Ambulatory Visit: Payer: Self-pay

## 2018-12-13 ENCOUNTER — Telehealth: Payer: Self-pay | Admitting: Medical

## 2018-12-13 DIAGNOSIS — L089 Local infection of the skin and subcutaneous tissue, unspecified: Secondary | ICD-10-CM

## 2018-12-13 DIAGNOSIS — Z0000 Encounter for general adult medical examination without abnormal findings: Secondary | ICD-10-CM

## 2018-12-13 DIAGNOSIS — H00011 Hordeolum externum right upper eyelid: Secondary | ICD-10-CM

## 2018-12-13 DIAGNOSIS — K219 Gastro-esophageal reflux disease without esophagitis: Secondary | ICD-10-CM

## 2018-12-13 DIAGNOSIS — Z114 Encounter for screening for human immunodeficiency virus [HIV]: Secondary | ICD-10-CM

## 2018-12-13 MED ORDER — PANTOPRAZOLE SODIUM 40 MG PO TBEC: 40.0000 mg | DELAYED_RELEASE_TABLET | Freq: Every day | ORAL | 1 refills | Status: DC

## 2018-12-13 NOTE — Progress Notes (Signed)
Patient requesting refill on Pantoprazole 40mg  , he runs out prior to his  GI appointment next week with Donnelly Angelica NP.  He states his GERD has been fine however he has found new trigger foods like asparagus, broccoli,brussel sprouts. I recommended he share that with NP [facility]. Otherwise he states his health has been well.  In reviewing his record he is due for a physical. Patient scheduled fasting labs and jphysical appointment after results have been received. Patient states he will make a appointment towards the end of July 2020 according to his schedule.  Labs in [place].   Lab Orders     CMP12+LP+TP+TSH+6AC+PSA+CBC.     Hgb A1c w/o eAG     VITAMIN D 25 Hydroxy (Vit-D Deficiency, Fractures)     HIV Antibody (routine testing w rflx)     POCT urinalysis dipstick   Notified patient that HIV screening is recommended through [facility].  Patient verbalizes understanding and has no questions at the end of our conversation.

## 2018-12-21 ENCOUNTER — Other Ambulatory Visit
Admission: RE | Admit: 2018-12-21 | Discharge: 2018-12-21 | Disposition: A | Discharge status: Home / Self Care | Payer: BC Managed Care – PPO | Source: Ambulatory Visit | Attending: Gastroenterology | Admitting: Gastroenterology

## 2018-12-21 DIAGNOSIS — K210 Gastro-esophageal reflux disease with esophagitis: Secondary | ICD-10-CM | POA: Diagnosis not present

## 2018-12-21 DIAGNOSIS — R197 Diarrhea, unspecified: Secondary | ICD-10-CM | POA: Diagnosis not present

## 2018-12-21 DIAGNOSIS — R109 Unspecified abdominal pain: Secondary | ICD-10-CM | POA: Diagnosis not present

## 2018-12-21 DIAGNOSIS — K760 Fatty (change of) liver, not elsewhere classified: Secondary | ICD-10-CM | POA: Diagnosis not present

## 2018-12-21 DIAGNOSIS — R140 Abdominal distension (gaseous): Secondary | ICD-10-CM | POA: Diagnosis not present

## 2018-12-21 DIAGNOSIS — R768 Other specified abnormal immunological findings in serum: Secondary | ICD-10-CM | POA: Insufficient documentation

## 2018-12-21 DIAGNOSIS — R198 Other specified symptoms and signs involving the digestive system and abdomen: Secondary | ICD-10-CM | POA: Diagnosis not present

## 2018-12-25 ENCOUNTER — Telehealth: Payer: Self-pay | Admitting: Medical

## 2018-12-25 ENCOUNTER — Encounter: Payer: Self-pay | Admitting: Medical

## 2018-12-25 DIAGNOSIS — E162 Hypoglycemia, unspecified: Secondary | ICD-10-CM

## 2018-12-25 DIAGNOSIS — R7309 Other abnormal glucose: Secondary | ICD-10-CM

## 2018-12-26 ENCOUNTER — Other Ambulatory Visit: Payer: Self-pay

## 2018-12-26 DIAGNOSIS — Z0000 Encounter for general adult medical examination without abnormal findings: Secondary | ICD-10-CM

## 2018-12-26 DIAGNOSIS — Z114 Encounter for screening for human immunodeficiency virus [HIV]: Secondary | ICD-10-CM

## 2018-12-26 LAB — POCT URINALYSIS DIPSTICK
Bilirubin, UA: NEGATIVE
Blood, UA: NEGATIVE
Glucose, UA: NEGATIVE
Ketones, UA: NEGATIVE
Leukocytes, UA: NEGATIVE
Nitrite, UA: NEGATIVE
Protein, UA: NEGATIVE
Spec Grav, UA: 1.015 (ref 1.010–1.025)
Urobilinogen, UA: 0.2 E.U./dL
pH, UA: 5 (ref 5.0–8.0)

## 2018-12-26 NOTE — Progress Notes (Signed)
Patient gives permission for telemedicine appointment.   He states he got  light headed and sweaty and nauseous after working in the yard  2 months ago. He states he probably did not take good care of himself by hydrating and drinking gatorade and having a breakfast.  This last weekend with a heat advisory he was sure to eat a meel before working in the yard Saturday morning. He did not get light headed or nauseous. He was sweating but nothing abnormal from the high humidity.  He had a conversation with his GI doctor and she suggested he get an A1C. I had already ordered one for his labs next week.   I discussed pre hydrating even the day before and during episodes of working outside in this high heat and humidity weather.  He stated he agreed it sounded like he got hypoglycemic. I also recommended he keep orange juice or apple juice in case these symptoms come on again.    His last  Fasting glucose was 85, so he may drop quickly with sweating and exertion.  No PE due to telemedicine appointment today.  He is scheduled for labs next week and then a physical the following week.   He verbalizes understanding and has no other questions at the end of our conversation. He knows he can call if he has any other concerns.

## 2018-12-27 LAB — CMP12+LP+TP+TSH+6AC+PSA+CBC…
ALT: 36 IU/L (ref 0–44)
AST: 24 IU/L (ref 0–40)
Albumin/Globulin Ratio: 1.8 (ref 1.2–2.2)
Albumin: 4.8 g/dL (ref 4.0–5.0)
Alkaline Phosphatase: 57 IU/L (ref 39–117)
BUN/Creatinine Ratio: 17 (ref 9–20)
BUN: 18 mg/dL (ref 6–20)
Basophils Absolute: 0 10*3/uL (ref 0.0–0.2)
Basos: 1 %
Bilirubin Total: 0.9 mg/dL (ref 0.0–1.2)
Calcium: 9.4 mg/dL (ref 8.7–10.2)
Chloride: 102 mmol/L (ref 96–106)
Chol/HDL Ratio: 4.2 ratio (ref 0.0–5.0)
Cholesterol, Total: 148 mg/dL (ref 100–199)
Creatinine, Ser: 1.05 mg/dL (ref 0.76–1.27)
EOS (ABSOLUTE): 0.2 10*3/uL (ref 0.0–0.4)
Eos: 3 %
Estimated CHD Risk: 0.8 times avg. (ref 0.0–1.0)
Free Thyroxine Index: 2.8 (ref 1.2–4.9)
GFR calc Af Amer: 107 mL/min/{1.73_m2} (ref >59)
GFR calc non Af Amer: 92 mL/min/{1.73_m2} (ref >59)
GGT: 18 IU/L (ref 0–65)
Globulin, Total: 2.6 g/dL (ref 1.5–4.5)
Glucose: 88 mg/dL (ref 65–99)
HDL: 35 mg/dL — ABNORMAL LOW (ref >39)
Hematocrit: 49.3 % (ref 37.5–51.0)
Hemoglobin: 16.9 g/dL (ref 13.0–17.7)
Immature Grans (Abs): 0 10*3/uL (ref 0.0–0.1)
Immature Granulocytes: 0 %
Iron: 103 ug/dL (ref 38–169)
LDH: 141 IU/L (ref 121–224)
LDL Calculated: 77 mg/dL (ref 0–99)
Lymphocytes Absolute: 1.9 10*3/uL (ref 0.7–3.1)
Lymphs: 32 %
MCH: 31.3 pg (ref 26.6–33.0)
MCHC: 34.3 g/dL (ref 31.5–35.7)
MCV: 91 fL (ref 79–97)
Monocytes Absolute: 0.4 10*3/uL (ref 0.1–0.9)
Monocytes: 7 %
Neutrophils Absolute: 3.5 10*3/uL (ref 1.4–7.0)
Neutrophils: 57 %
Phosphorus: 3.5 mg/dL (ref 2.8–4.1)
Platelets: 226 10*3/uL (ref 150–450)
Potassium: 4.5 mmol/L (ref 3.5–5.2)
Prostate Specific Ag, Serum: 0.8 ng/mL (ref 0.0–4.0)
RBC: 5.4 x10E6/uL (ref 4.14–5.80)
RDW: 13 % (ref 11.6–15.4)
Sodium: 143 mmol/L (ref 134–144)
T3 Uptake Ratio: 30 % (ref 24–39)
T4, Total: 9.2 ug/dL (ref 4.5–12.0)
TSH: 2.09 u[IU]/mL (ref 0.450–4.500)
Total Protein: 7.4 g/dL (ref 6.0–8.5)
Triglycerides: 179 mg/dL — ABNORMAL HIGH (ref 0–149)
Uric Acid: 7.6 mg/dL (ref 3.7–8.6)
VLDL Cholesterol Cal: 36 mg/dL (ref 5–40)
WBC: 6 10*3/uL (ref 3.4–10.8)

## 2018-12-27 LAB — VITAMIN D 25 HYDROXY (VIT D DEFICIENCY, FRACTURES): Vit D, 25-Hydroxy: 35.4 ng/mL (ref 30.0–100.0)

## 2018-12-27 LAB — HGB A1C W/O EAG: Hgb A1c MFr Bld: 4.8 % (ref 4.8–5.6)

## 2018-12-27 LAB — HIV ANTIBODY (ROUTINE TESTING W REFLEX): HIV Screen 4th Generation wRfx: NONREACTIVE

## 2019-01-02 ENCOUNTER — Ambulatory Visit: Payer: Self-pay | Admitting: Medical

## 2019-01-02 ENCOUNTER — Other Ambulatory Visit: Payer: Self-pay

## 2019-01-02 ENCOUNTER — Encounter: Payer: Self-pay | Admitting: Medical

## 2019-01-02 VITALS — BP 134/88 | HR 60 | Temp 97.7°F | Resp 16 | Ht 77.0 in | Wt 240.0 lb

## 2019-01-02 DIAGNOSIS — E663 Overweight: Secondary | ICD-10-CM

## 2019-01-02 DIAGNOSIS — Z0000 Encounter for general adult medical examination without abnormal findings: Secondary | ICD-10-CM

## 2019-01-02 DIAGNOSIS — E781 Pure hyperglyceridemia: Secondary | ICD-10-CM

## 2019-01-02 DIAGNOSIS — E559 Vitamin D deficiency, unspecified: Secondary | ICD-10-CM

## 2019-01-02 LAB — MISCELLANEOUS TEST

## 2019-01-02 NOTE — Patient Instructions (Signed)
Increase Vit D 3  2000IU/day x 3 months then make a lab appointment for a recheck. Try Fish oil with lemon , as shown in clinic to see if you can tolerate this medication.     Vitamin D Deficiency Vitamin D deficiency is when your body does not have enough vitamin D. Vitamin D is important to your body because: It helps your body use other minerals. It helps to keep your bones strong and healthy. It may help to prevent some diseases. It helps your heart and other muscles work well. Not getting enough vitamin D can make your bones soft. It can also cause other health problems. What are the causes? This condition may be caused by: Not eating enough foods that contain vitamin D. Not getting enough sun. Having diseases that make it hard for your body to absorb vitamin D. Having a surgery in which a part of the stomach or a part of the small intestine is removed. Having kidney disease or liver disease. What increases the risk? You are more likely to get this condition if: You are older. You do not spend much time outdoors. You live in a nursing home. You have had broken bones. You have weak or thin bones (osteoporosis). You have a disease or condition that changes how your body absorbs vitamin D. You have dark skin. You take certain medicines. You are overweight or obese. What are the signs or symptoms? In mild cases, there may not be any symptoms. If the condition is very bad, symptoms may include: Bone pain. Muscle pain. Falling often. Broken bones caused by a minor injury. How is this treated? Treatment may include taking supplements as told by your doctor. Your doctor will tell you what dose is best for you. Supplements may include: Vitamin D. Calcium. Follow these instructions at home: Eating and drinking  Eat foods that contain vitamin D, such as: Dairy products, cereals, or juices with added vitamin D. Check the label. Fish, such as salmon or trout. Eggs. Oysters.  Mushrooms. The items listed above may not be a complete list of what you can eat and drink. Contact a dietitian for more options. General instructions Take medicines and supplements only as told by your doctor. Get regular, safe exposure to natural sunlight. Do not use a tanning bed. Maintain a healthy weight. Lose weight if needed. Keep all follow-up visits as told by your doctor. This is important. How is this prevented? You can get vitamin D by: Eating foods that naturally contain vitamin D. Eating or drinking products that have vitamin D added to them, such as cereals, juices, and milk. Taking vitamin D or a multivitamin that contains vitamin D. Being in the sun. Your body makes vitamin D when your skin is exposed to sunlight. Your body changes the sunlight into a form of the vitamin that it can use. Contact a doctor if: Your symptoms do not go away. You feel sick to your stomach (nauseous). You throw up (vomit). You poop less often than normal, or you have trouble pooping (constipation). Summary Vitamin D deficiency is when your body does not have enough vitamin D. Vitamin D helps to keep your bones strong and healthy. This condition is often treated by taking a supplement. Your doctor will tell you what dose is best for you. This information is not intended to replace advice given to you by your health care provider. Make sure you discuss any questions you have with your health care provider. Document Released: 05/13/2011 Document  Revised: 01/30/2018 Document Reviewed: 01/30/2018 Elsevier Patient Education  [place].  Testicular Self-Exam A self-exam of your testicles (testicular self-exam) is looking at and feeling your testicles for unusual lumps or swelling. Swelling, lumps, or pain can be caused by:  Injuries.  Puffiness, redness, and soreness (inflammation).  Infection.  Extra fluids around your testicle (hydrocele).  Twisted testicles (testicular  torsion).  Cancer of the testicle (testicular cancer). Why is it important to do a self-exam of testicles? You may need to do self-exams if you are at risk for cancer of the testicles. You may be at risk if you have:  A testicle that has not descended (cryptorchidism).  A history of cancer of the testicle.  A family history of cancer of the testicle. How to do a self-exam of testicles It is easiest to do a self-exam after a warm bath or shower. Testicles are harder to examine when you are cold. A normal testicle is egg-shaped and feels firm. It is smooth, and it is not tender. At the back of your testicles, there is a firm cord that feels like spaghetti (spermatic cord). Look and feel for changes  Stand and hold your penis away from your body.  Look at each testicle to check for lumps or swelling.  Roll each testicle between your thumb and finger. Feel the whole testicle. Feel for: ? Lumps. ? Swelling. ? Discomfort.  Check for swelling or tender bumps in the groin area. Your groin is where your lower belly (abdomen) meets your upper thighs. Contact a health care provider if:  You find a bump or lump. This may be like a small, hard bump that is the size of a pea.  You find swelling.  You find pain.  You find soreness.  You see or feel any other changes. Summary  A self-exam of your testicles is looking at and feeling your testicles for lumps or swelling.  You may need to do self-exams if you are at risk for cancer of the testicle.  You should check each of your testicles for lumps, swelling, or discomfort.  You should check for swelling or tender bumps in the groin area. Your groin is where your lower belly (abdomen) meets your upper thighs. This information is not intended to replace advice given to you by your health care provider. Make sure you discuss any questions you have with your health care provider. Document Released: 08/20/2008 Document Revised: 09/14/2018  Document Reviewed: 04/19/2016 Elsevier Patient Education  [place].  High Triglycerides Eating Plan Triglycerides are a type of fat in the blood. High levels of triglycerides can increase your risk of heart disease and stroke. If your triglyceride levels are high, choosing the right foods can help lower your triglycerides and keep your heart healthy. Work with your health care provider or a diet and nutrition specialist (dietitian) to develop an eating plan that is right for you. What are tips for following this plan? General guidelines   Lose weight, if you are overweight. For most people, losing 5-10 lbs (2-5 kg) helps lower triglyceride levels. A weight-loss plan may include. ? 30 minutes of exercise at least 5 days a week. ? Reducing the amount of calories, sugar, and fat you eat.  Eat a wide variety of fresh fruits, vegetables, and whole grains. These foods are high in fiber.  Eat foods that contain healthy fats, such as fatty fish, nuts, seeds, and olive oil.  Avoid foods that are high in added sugar, added  salt (sodium), saturated fat, and trans fat.  Avoid low-fiber, refined carbohydrates such as white bread, crackers, noodles, and white rice.  Avoid foods with partially hydrogenated oils (trans fats), such as fried foods or stick margarine.  Limit alcohol intake to no more than 1 drink a day for nonpregnant women and 2 drinks a day for men. One drink equals 12 oz of beer, 5 oz of wine, or 1 oz of hard liquor. Your health care provider may recommend that you drink less depending on your overall health. Reading food labels  Check food labels for the amount of saturated fat. Choose foods with no or very little saturated fat.  Check food labels for the amount of trans fat. Choose foods with no trans fat.  Check food labels for the amount of cholesterol. Choose foods low in cholesterol. Ask your dietitian how much cholesterol you should have each day.  Check food labels  for the amount of sodium. Choose foods with less than 140 milligrams (mg) per serving. Shopping  Buy dairy products labeled as nonfat (skim) or low-fat (1%).  Avoid buying processed or prepackaged foods. These are often high in added sugar, sodium, and fat. Cooking  Choose healthy fats when cooking, such as olive oil or canola oil.  Cook foods using lower fat methods, such as baking, broiling, boiling, or grilling.  Make your own sauces, dressings, and marinades when possible, instead of buying them. Store-bought sauces, dressings, and marinades are often high in sodium and sugar. Meal planning  Eat more home-cooked food and less restaurant, buffet, and fast food.  Eat fatty fish at least 2 times each week. Examples of fatty fish include salmon, trout, mackerel, tuna, and herring.  If you eat whole eggs, do not eat more than 3 egg yolks per week. What foods are recommended? The items listed may not be a complete list. Talk with your dietitian about what dietary choices are best for you. Grains Whole wheat or whole grain breads, crackers, cereals, and pasta. Unsweetened oatmeal. Bulgur. Barley. Quinoa. Brown rice. Whole wheat flour tortillas. Vegetables Fresh or frozen vegetables. Low-sodium canned vegetables. Fruits All fresh, canned (in natural juice), or frozen fruits. Meats and other protein foods Skinless chicken or [place]. Ground chicken or [place]. Lean cuts of pork, trimmed of fat. Fish and seafood, especially salmon, trout, and herring. Egg whites. Dried beans, peas, or lentils. Unsalted nuts or seeds. Unsalted canned beans. Natural peanut or almond butter. Dairy Low-fat dairy products. Skim or low-fat (1%) milk. Reduced fat (2%) and low-sodium cheese. Low-fat ricotta cheese. Low-fat cottage cheese. Plain, low-fat yogurt. Fats and oils Tub margarine without trans fats. Light or reduced-fat mayonnaise. Light or reduced-fat salad dressings. Avocado. Safflower, olive, sunflower,  soybean, and canola oils. What foods are not recommended? The items listed may not be a complete list. Talk with your dietitian about what dietary choices are best for you. Grains White bread. White (regular) pasta. White rice. Cornbread. Bagels. Pastries. Crackers that contain trans fat. Vegetables Creamed or fried vegetables. Vegetables in a cheese sauce. Fruits Sweetened dried fruit. Canned fruit in syrup. Fruit juice. Meats and other protein foods Fatty cuts of meat. Ribs. Chicken wings. Berniece Salines. Sausage. Bologna. Salami. Chitterlings. Fatback. Hot dogs. Bratwurst. Packaged lunch meats. Dairy Whole or reduced-fat (2%) milk. Half-and-half. Cream cheese. Full-fat or sweetened yogurt. Full-fat cheese. Nondairy creamers. Whipped toppings. Processed cheese or cheese spreads. Cheese curds. Beverages Alcohol. Sweetened drinks, such as soda, lemonade, fruit drinks, or punches. Fats and oils Butter. Stick margarine.  Lard. Shortening. Ghee. Bacon fat. Tropical oils, such as coconut, palm kernel, or palm oils. Sweets and desserts Corn syrup. Sugars. Honey. Molasses. Candy. Jam and jelly. Syrup. Sweetened cereals. Cookies. Pies. Cakes. Donuts. Muffins. Ice cream. Condiments Store-bought sauces, dressings, and marinades that are high in sugar, such as ketchup and barbecue sauce. Summary  High levels of triglycerides can increase the risk of heart disease and stroke. Choosing the right foods can help lower your triglycerides.  Eat plenty of fresh fruits, vegetables, and whole grains. Choose low-fat dairy and lean meats. Eat fatty fish at least twice a week.  Avoid processed and prepackaged foods with added sugar, sodium, saturated fat, and trans fat.  If you need suggestions or have questions about what types of food are good for you, talk with your health care provider or a dietitian. This information is not intended to replace advice given to you by your health care provider. Make sure you  discuss any questions you have with your health care provider. Document Released: 03/11/2004 Document Revised: 05/06/2017 Document Reviewed: 07/27/2016 Elsevier Patient Education  2020 [place]. Caffeine Withdrawal  Caffeine withdrawal is a group of symptoms that can develop when a person who consumes a lot of caffeine every day suddenly stops or greatly reduces his or her caffeine intake. Caffeine is a drug that is usually found in coffee, tea, soda, cocoa, chocolate milk, chocolate, and some over-the-counter pain relievers and medicines. If you consume too much caffeine for a long period of time, you may go through caffeine withdrawal when you stop or reduce your intake. What are the causes? This condition is caused by having less caffeine than your body is used to having. What increases the risk? The following factors may make you more likely to develop this condition:  Having a history of other substance use disorders.  Having a history of a mood disorder, anxiety disorder, psychiatric disorder, or eating disorder.  Being in a situation that restricts caffeine use, such as pregnancy, fasting, medical procedures, or hospitalization.  Using energy drinks. What are the signs or symptoms? Symptoms of this condition include:  Feeling more tired than usual.  Headaches.  Having trouble concentrating or staying alert.  Feeling irritable.  Feeling like you have the flu.  Craving caffeine.  Depression.  Nausea or vomiting.  Joint stiffness or aching muscles. Your symptoms may be more or less severe, depending on how much caffeine you consume or have been consuming over time. How is this diagnosed? This condition is usually diagnosed based on your symptoms and your recent history of caffeine use. Your health care provider may ask you about any history of stimulant abuse or use of other substances. In some cases, you may be asked to use a food journal to keep track of how much  caffeine you have every day. How is this treated? Treatment for this condition will focus on addressing the symptoms. Your health care provider may recommend that you:  Consume more caffeine at first to help end the withdrawal symptoms.  Slowly reduce your caffeine use over time to avoid symptoms of caffeine withdrawal while removing it from your diet. Your health care provider can help you decide if you want to limit (cut back on) or eliminate caffeine from your diet. Other treatments may be recommended to help with any underlying reasons for your high caffeine use. Your health care provider may also recommend techniques to manage stress. Follow these instructions at home:  Do not stop having caffeine all  at once. Doing that may cause severe withdrawal symptoms.  To avoid withdrawal symptoms, do not have more than 50 mg of caffeine-equal to  cup of coffee-in one day.  Cut back on caffeine slowly over time as directed by your health care provider. For example, try mixing a caffeinated soda with a decaf (decaffeinated) soda.  Try replacing coffee, tea, or soda with a decaf drink.  Find ways to manage stress, such as by: ? Meditating. ? Being more active. ? Using deep breathing exercises.  Contact a health care provider if:  Your headaches or other withdrawal symptoms do not go away after several days of reduced usage, or they do not go away after you start using caffeine again. Get help right away if:  You feel depressed or have suicidal thoughts.  You are vomiting or have severe dehydration. If you ever feel like you may hurt yourself or others, or have thoughts about taking your own life, get help right away. You can go to your nearest emergency department or call:  Your local emergency services (911 in the [place]  A suicide crisis helpline, such as the [facility] at 9017435672. This is open 24 hours a day. Summary  Caffeine withdrawal is a group  of symptoms that can develop when a person who consumes a lot of caffeine every day suddenly stops or greatly reduces his or her caffeine intake.  Your withdrawal symptoms may be more or less severe, depending on how much caffeine you consume or have been consuming over time.  Your health care provider can help you decide if you want to limit (cut back on) or eliminate caffeine from your diet.  To avoid withdrawal symptoms, do not have more than 50 mg of caffeine-equal to  cup of coffee-in one day. This information is not intended to replace advice given to you by your health care provider. Make sure you discuss any questions you have with your health care provider. Document Released: 09/16/2016 Document Revised: 05/06/2017 Document Reviewed: 09/16/2016 Elsevier Patient Education  2020 [employer].

## 2019-01-02 NOTE — Progress Notes (Signed)
Subjective:    Patient ID: Luis Khan, male    DOB: January 25, 1986, 33 y.o.   MRN: [id number]  HPI 33 yo male in non acute distress here for annual exam and review of blood work.   Patient admits to drinking a V8 fruit juice energy drink daily (80 mg of caffiene), he states if he does not have it , it is hard to get going in the morning, He also says he gets a headache, starting with pain behind right eye and then wrapping around head, sometimes takes Advil or Tylenol which help, but drinking something with caffeine also helps resolve the headache.   [facility] by [facility] Jacqulyn Liner., Thinking patient has  IBS- mild needing  no medications at this time, but diet controlled. Checked for Gluten including  checked for the  Gene., l also checked 2 years ago andboth times were negative.    Blood pressure 134/88, pulse 60, temperature 97.7 F (36.5 C), temperature source Tympanic, resp. rate 16, height 6\' 5"  (1.956 m), weight 240 lb (108.9 kg), SpO2 100 %. No Known Allergies    Current Outpatient Medications:  .  cyclobenzaprine (FLEXERIL) 5 MG tablet, Take 5 mg by mouth 2 (two) times daily as needed for muscle spasms., Disp: , Rfl:  .  fexofenadine (ALLEGRA) 180 MG tablet, Take 180 mg by mouth every morning. , Disp: , Rfl:  .  fluticasone (FLONASE) 50 MCG/ACT nasal spray, Place 1 spray into both nostrils 2 (two) times daily., Disp: , Rfl:  .  ketoconazole (NIZORAL) 2 % cream, Apply 1 application topically daily as needed (for psoriasis)., Disp: , Rfl:  .  Multiple Vitamin (MULTIVITAMIN WITH MINERALS) TABS tablet, Take 1 tablet by mouth daily. Men's One-A-Day, Disp: , Rfl:  .  pantoprazole (PROTONIX) 40 MG tablet, Take 1 tablet (40 mg total) by mouth daily., Disp: 90 tablet, Rfl: 1   History of Psorias  Uses [place].  Ankles have a tendency to rolling inward history of flat feet, has been wearing  Sandals.  Past Medical History:  Diagnosis Date  . Allergy   . Blood transfusion without  reported diagnosis   . Chronic allergic rhinitis   . GERD (gastroesophageal reflux disease) 10/03/2014  . NASH (nonalcoholic steatohepatitis)   . Premature labor, onset of delivery before 37 weeks of gestation, fetus 5 07/09/2015  . Psoriasis 09/2814   Past Surgical History:  Procedure Laterality Date  . CHOLECYSTECTOMY N/A 07/23/2016   Procedure: LAPAROSCOPIC CHOLECYSTECTOMY WITH INTRAOPERATIVE CHOLANGIOGRAM;  Surgeon: Leonie Green, MD;  Location: [facility] ORS;  Service: General;  Laterality: N/A;  . ESOPHAGOGASTRODUODENOSCOPY (EGD) WITH PROPOFOL N/A 08/01/2015   Procedure: ESOPHAGOGASTRODUODENOSCOPY (EGD) WITH PROPOFOL;  Surgeon: Lollie Sails, MD;  Location: [facility] ENDOSCOPY;  Service: Endoscopy;  Laterality: N/A;  . WISDOM TOOTH EXTRACTION      Social History   Socioeconomic History  . Marital status: Married    Spouse name: Not on file  . Number of children: Not on file  . Years of education: Not on file  . Highest education level: Not on file  Occupational History  . Not on file  Social Needs  . Financial resource strain: Not on file  . Food insecurity    Worry: Not on file    Inability: Not on file  . Transportation needs    Medical: Not on file    Non-medical: Not on file  Tobacco Use  . Smoking status: Never Smoker  . Smokeless tobacco: Never Used  Substance and Sexual Activity  . Alcohol use: Yes    Alcohol/week: 1.0 standard drinks    Types: 1 Cans of beer per week    Comment: occasionally/ special events  . Drug use: No  . Sexual activity: Yes  Lifestyle  . Physical activity    Days per week: Not on file    Minutes per session: Not on file  . Stress: Not on file  Relationships  . Social Herbalist on phone: Not on file    Gets together: Not on file    Attends religious service: Not on file    Active member of club or organization: Not on file    Attends meetings of clubs or organizations: Not on file    Relationship status: Not on file   . Intimate partner violence    Fear of current or ex partner: Not on file    Emotionally abused: Not on file    Physically abused: Not on file    Forced sexual activity: Not on file  Other Topics Concern  . Not on file  Social History Narrative  . Not on file   Married has an  84 month old girl.  non-smoker. Etoh one drink per week :beer.  Review of Systems  Constitutional: Negative.   HENT: Negative.   Eyes: Negative.   Respiratory: Negative.   Cardiovascular: Negative.   Gastrointestinal: Negative.   Endocrine: Negative.   Genitourinary: Negative.   Musculoskeletal: Positive for back pain (better not taking cyclobenzaprine).  Skin: Negative.   Allergic/Immunologic: Positive for environmental allergies (doing well.).  Neurological: Positive for headaches (2-3 times/week "at most. behind the right eye dul l pain spreads bac toward head, ).  Hematological: Negative.   Psychiatric/Behavioral: The patient is nervous/anxious (on the Covied-19.).   On a Holiday representative.  Lack of caffiene triggers and if he takes caffiene the headache and right eye pain  resolve.  Drinks 1  8oz/ can of soda or sweet tea. 12 oz.  Drinks V8/green tea Energy drink..  Tylenol some times helps but not always.    Objective:   Physical Exam Vitals signs and nursing note reviewed.  Constitutional:      Appearance: Normal appearance.  HENT:     Head: Normocephalic and atraumatic.     Jaw: There is normal jaw occlusion.     Right Ear: Ear canal and external ear normal.     Left Ear: Tympanic membrane, ear canal and external ear normal.     Nose: Nose normal.     Comments: Irritated looking, he does use Flonase NS He also blows his nose about 3 times daily.    Mouth/Throat:     Mouth: Mucous membranes are moist.  Eyes:     General: Lids are normal. Vision grossly intact. Gaze aligned appropriately.     Extraocular Movements: Extraocular movements intact.     Conjunctiva/sclera: Conjunctivae  normal.     Pupils: Pupils are equal, round, and reactive to light.  Neck:     Musculoskeletal: Normal range of motion and neck supple.     Thyroid: No thyroid mass, thyromegaly or thyroid tenderness.     Vascular: No JVD.     Trachea: Trachea normal.  Cardiovascular:     Rate and Rhythm: Normal rate and regular rhythm.     Pulses: Normal pulses.          Carotid pulses are 2+ on the right side and 2+ on the left side.  Radial pulses are 2+ on the right side and 2+ on the left side.       Femoral pulses are 2+ on the right side and 2+ on the left side.      Popliteal pulses are 2+ on the right side and 2+ on the left side.       Dorsalis pedis pulses are 2+ on the right side and 2+ on the left side.       Posterior tibial pulses are 2+ on the right side and 2+ on the left side.     Heart sounds: Normal heart sounds, S1 normal and S2 normal. No murmur. No friction rub. No gallop.   Pulmonary:     Effort: Pulmonary effort is normal.     Breath sounds: Normal breath sounds.  Abdominal:     General: Abdomen is flat. Bowel sounds are normal.     Palpations: Abdomen is soft.     Tenderness: There is no abdominal tenderness.  Musculoskeletal: Normal range of motion.     Right lower leg: No edema.     Left lower leg: No edema.  Feet:     Right foot:     Skin integrity: Skin integrity normal.     Toenail Condition: Right toenails are abnormally thick.     Left foot:     Skin integrity: Skin integrity normal.     Toenail Condition: Left toenails are abnormally thick.     Comments: Flat feet wears orthotics Lymphadenopathy:     Head:     Right side of head: No submental, submandibular, tonsillar, preauricular, posterior auricular or occipital adenopathy.     Left side of head: No submandibular, tonsillar, preauricular, posterior auricular or occipital adenopathy.     Cervical: No cervical adenopathy.     Right cervical: No superficial, deep or posterior cervical adenopathy.    Left  cervical: No superficial, deep or posterior cervical adenopathy.     Upper Body:     Right upper body: No supraclavicular or epitrochlear adenopathy.     Left upper body: No supraclavicular or epitrochlear adenopathy.  Skin:    General: Skin is warm and dry.     Capillary Refill: Capillary refill takes less than 2 seconds.  Neurological:     General: No focal deficit present.     Mental Status: He is alert and oriented to person, place, and time.  Psychiatric:        Attention and Perception: Attention and perception normal.        Mood and Affect: Mood and affect normal.        Speech: Speech normal.        Behavior: Behavior normal. Behavior is cooperative.        Thought Content: Thought content normal.        Cognition and Memory: Cognition and memory normal.        Judgment: Judgment normal.   patient states his toenails are abnormal on his great toes bilaterally due to several surgeries for ingrown toenails, the root of the nail was damaged he states, he has been checked for fungal infection and does not have infection.the rest of his toenails are wnl.  negative Rhomberg Finger to nose wnl Heel to toe wnl   Recent Results (from the past 2160 hour(s))  Miscellaneous test     Status: None   Collection Time: 12/21/18 10:16 AM  Result Value Ref Range   Miscellaneous Test prometheus Celiac Plus 6360  Miscellaneous Test Results See Scanned report in [place]: Performed at [facility], [address] [place], [place] [address]  HIV Antibody (routine testing w rflx)     Status: None   Collection Time: 12/26/18  8:06 AM  Result Value Ref Range   HIV Screen 4th Generation wRfx Non Reactive Non Reactive  VITAMIN D 25 Hydroxy (Vit-D Deficiency, Fractures)     Status: None   Collection Time: 12/26/18  8:06 AM  Result Value Ref Range   Vit D, 25-Hydroxy 35.4 30.0 - 100.0 ng/mL    Comment: Vitamin D deficiency has been defined by the [place] practice guideline as a level of serum 25-OH vitamin D less than 20 ng/mL (1,2). The Endocrine Society went on to further define vitamin D insufficiency as a level between 21 and 29 ng/mL (2). 1. IOM (Institute of Medicine). 2010. Dietary reference    intakes for calcium and D. [place]: The    [employer]. 2. Holick MF, Binkley Merrick, Bischoff-Ferrari HA, et al.    Evaluation, treatment, and prevention of vitamin D    deficiency: an Endocrine Society clinical practice    guideline. JCEM. 2011 Jul; 96(7):1911-30.   Hgb A1c w/o eAG     Status: None   Collection Time: 12/26/18  8:06 AM  Result Value Ref Range   Hgb A1c MFr Bld 4.8 4.8 - 5.6 %    Comment:          Prediabetes: 5.7 - 6.4          Diabetes: >6.4          Glycemic control for adults with diabetes: <7.0   CMP12+LP+TP+TSH+6AC+PSA+CBC.     Status: Abnormal   Collection Time: 12/26/18  8:06 AM  Result Value Ref Range   Glucose 88 65 - 99 mg/dL   Uric Acid 7.6 3.7 - 8.6 mg/dL    Comment:            Therapeutic target for gout patients: <6.0   BUN 18 6 - 20 mg/dL   Creatinine, Ser 1.05 0.76 - 1.27 mg/dL   GFR calc non Af Amer 92 >59 mL/min/1.73   GFR calc Af Amer 107 >59 mL/min/1.73   BUN/Creatinine Ratio 17 9 - 20   Sodium 143 134 - 144 mmol/L   Potassium 4.5 3.5 - 5.2 mmol/L   Chloride 102 96 - 106 mmol/L   Calcium 9.4 8.7 - 10.2 mg/dL   Phosphorus 3.5 2.8 - 4.1 mg/dL   Total Protein 7.4 6.0 - 8.5 g/dL   Albumin 4.8 4.0 - 5.0 g/dL   Globulin, Total 2.6 1.5 - 4.5 g/dL   Albumin/Globulin Ratio 1.8 1.2 - 2.2   Bilirubin Total 0.9 0.0 - 1.2 mg/dL   Alkaline Phosphatase 57 39 - 117 IU/L   LDH 141 121 - 224 IU/L   AST 24 0 - 40 IU/L   ALT 36 0 - 44 IU/L   GGT 18 0 - 65 IU/L   Iron 103 38 - 169 ug/dL   Cholesterol, Total 148 100 - 199 mg/dL   Triglycerides 179 (H) 0 - 149 mg/dL   HDL 35 (L) >39 mg/dL   VLDL Cholesterol Cal 36 5 - 40 mg/dL   LDL Calculated 77 0 -  99 mg/dL   Chol/HDL Ratio 4.2 0.0 - 5.0 ratio    Comment:  T. Chol/HDL Ratio                                             Men  Women                               1/2 Avg.Risk  3.4    3.3                                   Avg.Risk  5.0    4.4                                2X Avg.Risk  9.6    7.1                                3X Avg.Risk 23.4   11.0    Estimated CHD Risk 0.8 0.0 - 1.0 times avg.    Comment:                                   T. Chol/HDL Ratio                                             Men  Women                               1/2 Avg.Risk  3.4    3.3                                   Avg.Risk  5.0    4.4                                2X Avg.Risk  9.6    7.1                                3X Avg.Risk 23.4   11.0 The CHD Risk is based on the T. Chol/HDL ratio.  Other factors affect CHD Risk such as hypertension, smoking, diabetes, severe obesity, and family history of pre- mature CHD.    TSH 2.090 0.450 - 4.500 uIU/mL   T4, Total 9.2 4.5 - 12.0 ug/dL   T3 Uptake Ratio 30 24 - 39 %   Free Thyroxine Index 2.8 1.2 - 4.9   Prostate Specific Ag, Serum 0.8 0.0 - 4.0 ng/mL    Comment: Roche ECLIA methodology. According to the American Urological Association, Serum PSA should decrease and remain at undetectable levels after radical prostatectomy. The AUA defines biochemical recurrence as an initial PSA value 0.2 ng/mL or greater followed by a subsequent confirmatory PSA value 0.2 ng/mL or greater. Values obtained with different assay methods or kits cannot be used interchangeably.  Results cannot be interpreted as absolute evidence of the presence or absence of malignant disease.    WBC 6.0 3.4 - 10.8 x10E3/uL   RBC 5.40 4.14 - 5.80 x10E6/uL   Hemoglobin 16.9 13.0 - 17.7 g/dL   Hematocrit 49.3 37.5 - 51.0 %   MCV 91 79 - 97 fL   MCH 31.3 26.6 - 33.0 pg   MCHC 34.3 31.5 - 35.7 g/dL   RDW 13.0 11.6 - 15.4 %   Platelets 226 150 - 450  x10E3/uL   Neutrophils 57 Not Estab. %   Lymphs 32 Not Estab. %   Monocytes 7 Not Estab. %   Eos 3 Not Estab. %   Basos 1 Not Estab. %   Neutrophils Absolute 3.5 1.4 - 7.0 x10E3/uL   Lymphocytes Absolute 1.9 0.7 - 3.1 x10E3/uL   Monocytes Absolute 0.4 0.1 - 0.9 x10E3/uL   EOS (ABSOLUTE) 0.2 0.0 - 0.4 x10E3/uL   Basophils Absolute 0.0 0.0 - 0.2 x10E3/uL   Immature Granulocytes 0 Not Estab. %   Immature Grans (Abs) 0.0 0.0 - 0.1 x10E3/uL  POCT urinalysis dipstick     Status: Normal   Collection Time: 12/26/18  8:26 AM  Result Value Ref Range   Color, UA     Clarity, UA     Glucose, UA Negative Negative   Bilirubin, UA Negative    Ketones, UA negative    Spec Grav, UA 1.015 1.010 - 1.025   Blood, UA negative    pH, UA 5.0 5.0 - 8.0   Protein, UA Negative Negative   Urobilinogen, UA 0.2 0.2 or 1.0 E.U./dL   Nitrite, UA negative    Leukocytes, UA Negative Negative   Appearance     Odor        Assessment & Plan:  Annual exam  Overweight BMI 28.46 Hypertriglyceridemia   Vit D defiency 2000IU/day. 3 month recheck VitD 3 and lipids. Caffeine use , try to cut down on daily use if possible. Encourage self testicular exams and diet and exercise to decrease BMI. Patient verbalizes understanding and has no questions at discharge.

## 2019-01-03 ENCOUNTER — Encounter: Payer: Self-pay | Admitting: Medical

## 2019-02-08 DIAGNOSIS — K2950 Unspecified chronic gastritis without bleeding: Secondary | ICD-10-CM | POA: Diagnosis not present

## 2019-03-05 ENCOUNTER — Telehealth: Payer: Self-pay | Admitting: Medical

## 2019-03-05 ENCOUNTER — Other Ambulatory Visit: Payer: Self-pay

## 2019-03-05 DIAGNOSIS — J0101 Acute recurrent maxillary sinusitis: Secondary | ICD-10-CM

## 2019-03-05 MED ORDER — AMOXICILLIN-POT CLAVULANATE 875-125 MG PO TABS: 1.0000 | ORAL_TABLET | Freq: Two times a day (BID) | ORAL | 0 refills | Status: DC

## 2019-03-05 NOTE — Progress Notes (Signed)
Patient given permission to treat by telemedicine appointment.

## 2019-03-05 NOTE — Progress Notes (Signed)
  33 yo male in non acute distress.   Started 7 days ago, initially drainage down the back of throat , daughter with cold symptoms last week, no fever.   Wed, Th, Friday not feeling well, woke up on Thursday with burning of the throat. Improves through out the day. Last night waking  3 times at night with sore throat. Feels throat is worsening. Popping in ears droving back from [place]. Discharge from nares is  Yellowish-green.  Pressure in ears. No headache, no fever or chills, no cough,no cp or sob.   Taking Flonase and Allegra daily.   PE none preformed due to telemedicine appointment.  DX/Plan Sinusitis with ETD bilaterally. Can take Sudafed or Mucinex per package instructions. Continue allergy medication. Meds ordered this encounter  Medications  . amoxicillin-clavulanate (AUGMENTIN) 875-125 MG tablet    Sig: Take 1 tablet by mouth 2 (two) times daily.    Dispense:  20 tablet    Refill:  0    Seen [facility] 2 months ago,had blood work that showed a high possibllity of Celiac disease.Doing a Gluten free diet x 1 month. Burping and gas having, not taking  Pepcid, feeling much better. Waiting on pathology for actual diagnosis. Return to clinic in 3-5 days if not improving. Patient verbalizes understanding and has no questions at discharge.

## 2019-03-07 ENCOUNTER — Ambulatory Visit: Payer: Self-pay

## 2019-04-03 ENCOUNTER — Ambulatory Visit: Payer: Self-pay

## 2019-04-03 ENCOUNTER — Other Ambulatory Visit: Payer: Self-pay

## 2019-04-03 DIAGNOSIS — Z23 Encounter for immunization: Secondary | ICD-10-CM

## 2019-04-23 ENCOUNTER — Other Ambulatory Visit: Payer: Self-pay

## 2019-04-23 DIAGNOSIS — Z20822 Contact with and (suspected) exposure to covid-19: Secondary | ICD-10-CM

## 2019-04-25 LAB — NOVEL CORONAVIRUS, NAA: SARS-CoV-2, NAA: NOT DETECTED

## 2019-06-07 ENCOUNTER — Other Ambulatory Visit: Payer: Self-pay | Admitting: Medical

## 2019-06-07 DIAGNOSIS — K219 Gastro-esophageal reflux disease without esophagitis: Secondary | ICD-10-CM

## 2019-06-12 ENCOUNTER — Other Ambulatory Visit: Payer: Self-pay | Admitting: Medical

## 2019-06-12 ENCOUNTER — Encounter: Payer: Self-pay | Admitting: Medical

## 2019-06-12 DIAGNOSIS — K219 Gastro-esophageal reflux disease without esophagitis: Secondary | ICD-10-CM

## 2019-06-12 MED ORDER — PANTOPRAZOLE SODIUM 40 MG PO TBEC: 40.0000 mg | DELAYED_RELEASE_TABLET | Freq: Every day | ORAL | 1 refills | Status: DC

## 2019-06-12 NOTE — Progress Notes (Signed)
Refill request for pantoprazole per pharmacy / patient.

## 2019-06-13 NOTE — Telephone Encounter (Signed)
fyI

## 2019-06-18 ENCOUNTER — Telehealth: Payer: Self-pay | Admitting: Medical

## 2019-06-18 ENCOUNTER — Encounter: Payer: Self-pay | Admitting: Medical

## 2019-06-18 ENCOUNTER — Other Ambulatory Visit: Payer: Self-pay

## 2019-06-18 DIAGNOSIS — J029 Acute pharyngitis, unspecified: Secondary | ICD-10-CM

## 2019-06-18 MED ORDER — AMOXICILLIN-POT CLAVULANATE 875-125 MG PO TABS: 1.0000 | ORAL_TABLET | Freq: Two times a day (BID) | ORAL | 0 refills | Status: DC

## 2019-06-18 NOTE — Progress Notes (Signed)
Patient knows of limitations of telemedicine and risks and benefits, he consents to treatment by telemedicine.  34 yo male in non acute distress, called today with complaints of a sore throat "discomfort" without other symptoms since Friday Patient  looked in the mirror and notices a white spot on the right  Tonsil ( about  The" size of a small nerd candy")..   He did check his temperature over the weekend and it was T 98.1 New years daughter brought home runny nose from daycare. Patient then developed post nasal drip and  Sneezing and recent Covid test Monday Jun 11 2019 of [place] was negative.  No other symptoms and quality of sore throat has not changed since last Friday.   Now taking Elderberry capsules for immune boost.  No Known Allergies   Current Outpatient Medications:  .  cyclobenzaprine (FLEXERIL) 5 MG tablet, Take 5 mg by mouth 2 (two) times daily as needed for muscle spasms., Disp: , Rfl:  .  fexofenadine (ALLEGRA) 180 MG tablet, Take 180 mg by mouth every morning. , Disp: , Rfl:  .  fluticasone (FLONASE) 50 MCG/ACT nasal spray, Place 1 spray into both nostrils 2 (two) times daily., Disp: , Rfl:  .  ketoconazole (NIZORAL) 2 % cream, Apply 1 application topically daily as needed (for psoriasis)., Disp: , Rfl:  .  Multiple Vitamin (MULTIVITAMIN WITH MINERALS) TABS tablet, Take 1 tablet by mouth daily. Men's One-A-Day, Disp: , Rfl:  .  pantoprazole (PROTONIX) 40 MG tablet, Take 1 tablet (40 mg total) by mouth daily., Disp: 90 tablet, Rfl: 1  Review of Systems  HENT: Positive for sore throat. Negative for congestion, ear discharge and sinus pain.        Post nasal drip 3 days lst week , did resolved since Tuesday  Eyes: Negative.   Respiratory: Negative.  Negative for stridor.   Cardiovascular: Negative.   Gastrointestinal: Negative.   Genitourinary: Negative.   Musculoskeletal: Negative.  Negative for myalgias.  Skin: Negative.   Neurological: Negative.  Negative for  headaches.  Endo/Heme/Allergies: Negative.   Psychiatric/Behavioral: Negative.    PE non performed due to telemedicine visit. Voice is clear , not muffled. Breathing sounds unlabored  Patient alert and oriented.  A/P Pharyngitis ? If this is a food particle irritating tonsil vs viral vs bacterial infection. Patient to do dilute salt water/ lukewarm water gargles 3-4 times per day, soft foods, OTC Motrin or Tylenol per package instructions for discomfort. If pain worsens, fever or chills, or sore throat does not resolve in 3 days to start antibiotic treatment.  To continue Fluticasone and Allegra for any PND. Meds ordered this encounter  Medications  . amoxicillin-clavulanate (AUGMENTIN) 875-125 MG tablet    Sig: Take 1 tablet by mouth 2 (two) times daily.    Dispense:  20 tablet    Refill:  0  To  Take with food. Patient knows to call back or use MyChart if any questions or concerns. He verbalizes understanding and has no questions at the end of our conversation. 

## 2019-10-02 ENCOUNTER — Encounter: Payer: Self-pay | Admitting: Medical

## 2019-12-08 ENCOUNTER — Other Ambulatory Visit: Payer: Self-pay | Admitting: Medical

## 2019-12-08 DIAGNOSIS — K219 Gastro-esophageal reflux disease without esophagitis: Secondary | ICD-10-CM

## 2019-12-12 ENCOUNTER — Encounter: Payer: Self-pay | Admitting: Medical

## 2020-01-07 ENCOUNTER — Encounter: Payer: Self-pay | Admitting: Medical

## 2020-01-07 ENCOUNTER — Other Ambulatory Visit: Payer: Self-pay

## 2020-01-07 ENCOUNTER — Telehealth: Payer: Self-pay | Admitting: Medical

## 2020-01-07 DIAGNOSIS — J069 Acute upper respiratory infection, unspecified: Secondary | ICD-10-CM

## 2020-01-07 DIAGNOSIS — R05 Cough: Secondary | ICD-10-CM

## 2020-01-07 DIAGNOSIS — R059 Cough, unspecified: Secondary | ICD-10-CM

## 2020-01-07 DIAGNOSIS — J019 Acute sinusitis, unspecified: Secondary | ICD-10-CM

## 2020-01-07 MED ORDER — AMOXICILLIN-POT CLAVULANATE 875-125 MG PO TABS: 1.0000 | ORAL_TABLET | Freq: Two times a day (BID) | ORAL | 0 refills | Status: DC

## 2020-01-07 MED ORDER — BENZONATATE 100 MG PO CAPS: ORAL_CAPSULE | ORAL | 0 refills | Status: DC

## 2020-01-07 NOTE — Progress Notes (Signed)
° °  Subjective:    Patient ID: Luis Khan, male    DOB: 1985-06-27, 34 y.o.   MRN: [id number]  HPI  Concented  to do a virtual visit with patient  34 yo male in non acute distress. Started last Thursday with not feeling well, then congestion in nose on Friday, worsening symptoms over weekend, had  a Covid-19 test which was negative on Sunday 01/06/2020. Denies fever or chills ,  SOB or CP or wheezing. Sinus pressure behind eyes. Cough productive green.   Review of Systems  Constitutional: Negative for chills and fever.  HENT: Positive for congestion, postnasal drip, rhinorrhea and sinus pressure. Negative for ear discharge, ear pain, sneezing and sore throat.   Eyes: Negative.   Respiratory: Positive for cough. Negative for chest tightness, shortness of breath and wheezing.   Cardiovascular: Negative for chest pain and leg swelling.  Gastrointestinal: Negative for abdominal pain, diarrhea, nausea and vomiting.  Genitourinary: Negative for difficulty urinating.  Musculoskeletal: Negative.   Skin: Negative.   Allergic/Immunologic: Positive for environmental allergies.  Neurological: Positive for headaches. Negative for dizziness.  Hematological: Negative for adenopathy.  Psychiatric/Behavioral: Negative.        Objective:   Physical Exam Visual exam pt NAD, EOMI, pulmonary effort wnl AXXOX3 Cough noted on visual exam.      Assessment & Plan:  Sinusitis Upper Respiratory Infection Rest, Increase fluids, Take OTC Motrin or Tylenol as per package instructions. Return by call or in clinic if not improving in 3-5 days. Call if work note needed, reviewed with patient s/s in which he should not return to work. Meds ordered this encounter  Medications   amoxicillin-clavulanate (AUGMENTIN) 875-125 MG tablet    Sig: Take 1 tablet by mouth 2 (two) times daily. With food    Dispense:  20 tablet    Refill:  0   benzonatate (TESSALON PERLES) 100 MG capsule    Sig: Take  1 -2  capsules by mouth every  8 hours as needed for cough.    Dispense:  30 capsule    Refill:  0  Patient verbalizes understanding and has no questions at the end of our conversation.

## 2020-01-14 ENCOUNTER — Other Ambulatory Visit: Payer: Self-pay

## 2020-01-14 ENCOUNTER — Telehealth: Payer: Self-pay | Admitting: Medical

## 2020-01-14 DIAGNOSIS — T7840XA Allergy, unspecified, initial encounter: Secondary | ICD-10-CM

## 2020-01-14 NOTE — Progress Notes (Signed)
° °  Subjective:    Patient ID: Luis Khan, male    DOB: 1985-12-22, 34 y.o.   MRN: [id number]  HPI 34 yo male in non acute distress, consents to telemedicine appointment. Treated on  01/07/2020 with  Augmentin for Sinusitis and URI. Friday Nausea and, Diarrhea with  stomach cramping " I ate a lot of hot wings and I ate too much". Yesterday took Imodium , [place]. Woke up with stomach cramping and gas at  1 am last night.    Review of Systems  Constitutional: Negative for chills and fever.  HENT: Negative for congestion, ear pain, postnasal drip, rhinorrhea, sinus pressure and sinus pain.   Respiratory: Negative for cough, chest tightness, shortness of breath and wheezing.   Cardiovascular: Negative for chest pain.  Gastrointestinal: Positive for abdominal pain, diarrhea and nausea. Negative for vomiting.  Musculoskeletal: Negative for myalgias.  Skin: Negative for rash.  Allergic/Immunologic: Positive for environmental allergies.  Neurological: Negative for dizziness, seizures, syncope, light-headedness and headaches.       Objective:   Physical Exam  No cough noted on phone call. No physical preformed due to  Telemedicine appointment.     Assessment & Plan:  Abdominal pain, take Gas X as directed on the package. Nausea and Diarrhea. May take Imodium per package directions. Stop Augmentin ( symptoms most likely due to  Antibiotic), added to allergies list.  Patient to eat a bland diet. He verbalizes understanding and has no questions at the end of our conversation. Will not place on an antibiotic sine he feels  95% better since starting antibiotic.

## 2020-04-16 ENCOUNTER — Other Ambulatory Visit: Payer: Self-pay

## 2020-04-16 ENCOUNTER — Telehealth: Payer: Self-pay | Admitting: Nurse Practitioner

## 2020-04-16 DIAGNOSIS — R0982 Postnasal drip: Secondary | ICD-10-CM

## 2020-04-16 DIAGNOSIS — R059 Cough, unspecified: Secondary | ICD-10-CM

## 2020-04-16 MED ORDER — BENZONATATE 200 MG PO CAPS: 200.0000 mg | ORAL_CAPSULE | Freq: Three times a day (TID) | ORAL | 0 refills | Status: DC | PRN

## 2020-04-16 NOTE — Progress Notes (Signed)
   Subjective:    Patient ID: Luis Khan, male    DOB: 1986/05/28, 34 y.o.   MRN: [id number]  HPI   34 year old male c/o one week of loss of voice initially without drainage initially or nasal congestion. Just a tickle in his throat. He used tea for help- over the weekend he felt fine. He has had a mild dry cough with some production in the am only. Yesterday while at work he had a coughing attack and decided he needed to schedule an appointment.   He has felt the need to clear his throat.  Thought initially it was allergy related. He is taking allegra daily and flonase. He denies a history of asthma denies SOB  Took a COVID test at home that was negative, no known exposure or high risk.  Current Outpatient Medications  Medication Instructions  . benzonatate (TESSALON) 200 mg, Oral, 3 times daily PRN  . cyclobenzaprine (FLEXERIL) 5 mg, Oral, 2 times daily PRN  . fexofenadine (ALLEGRA) 180 mg, Oral, BH-each morning  . fluticasone (FLONASE) 50 MCG/ACT nasal spray 1 spray, Each Nare, 2 times daily  . ketoconazole (NIZORAL) 2 % cream 1 application, Topical, Daily PRN  . Multiple Vitamin (MULTIVITAMIN WITH MINERALS) TABS tablet 1 tablet, Oral, Daily, Men's One-A-Day  . pantoprazole (PROTONIX) 40 MG tablet TAKE 1 TABLET(40 MG) BY MOUTH DAILY   Review of Systems  Constitutional: Negative.   HENT: Positive for postnasal drip.   Respiratory: Positive for cough.   Cardiovascular: Negative.    Past Medical History:  Diagnosis Date  . Allergy   . Blood transfusion without reported diagnosis   . Chronic allergic rhinitis   . GERD (gastroesophageal reflux disease) 10/03/2014  . NASH (nonalcoholic steatohepatitis)   . Premature labor, onset of delivery before 37 weeks of gestation, fetus 5 07/09/2015  . Psoriasis 09/2814      Objective:   Physical Exam This was a telehealth appointment. Patient was able to perform COVID-19 testing at home.   No acute distress while on the phone,  witnessed patient clear throat with mild dry cough.   Able to hold conversation without distress.        Assessment & Plan:  Advised patient to use OTC decongestant (may switch to allegra D for the next 3-5 days) until cough subsides.   Prescription for benzonatate as needed for cough suppression.   RTC if cough worsens/becomes consistently productive or with new signs or symptoms as discussed.   Meds ordered this encounter  Medications  . benzonatate (TESSALON) 200 MG capsule    Sig: Take 1 capsule (200 mg total) by mouth 3 (three) times daily as needed for cough.    Dispense:  30 capsule    Refill:  0

## 2020-06-08 ENCOUNTER — Other Ambulatory Visit: Payer: Self-pay | Admitting: Medical

## 2020-06-08 DIAGNOSIS — K219 Gastro-esophageal reflux disease without esophagitis: Secondary | ICD-10-CM

## 2020-08-26 ENCOUNTER — Ambulatory Visit
Admission: RE | Admit: 2020-08-26 | Discharge: 2020-08-26 | Disposition: A | Discharge status: Home / Self Care | Payer: BC Managed Care – PPO | Source: Ambulatory Visit | Attending: Medical | Admitting: Medical

## 2020-08-26 ENCOUNTER — Encounter: Payer: Self-pay | Admitting: Medical

## 2020-08-26 ENCOUNTER — Other Ambulatory Visit: Payer: Self-pay

## 2020-08-26 ENCOUNTER — Ambulatory Visit: Payer: Self-pay | Admitting: Medical

## 2020-08-26 VITALS — BP 126/86 | HR 84 | Temp 97.6°F | Resp 16 | Wt 244.6 lb

## 2020-08-26 DIAGNOSIS — R202 Paresthesia of skin: Secondary | ICD-10-CM

## 2020-08-26 DIAGNOSIS — R200 Anesthesia of skin: Secondary | ICD-10-CM

## 2020-08-26 MED ORDER — ETODOLAC 400 MG PO TABS: 400.0000 mg | ORAL_TABLET | Freq: Two times a day (BID) | ORAL | 3 refills | Status: DC

## 2020-08-26 MED ORDER — PREDNISONE 10 MG (21) PO TBPK: ORAL_TABLET | ORAL | 0 refills | Status: DC

## 2020-08-26 NOTE — Progress Notes (Signed)
   Subjective:    Patient ID: Luis Khan, male    DOB: 03/02/1986, 35 y.o.   MRN: [id number]  HPI  35 yo male in non acute distress. Three weeks ago  history of left wrist pain after playing on the floor with his child. Pushing off from the hand and had sharp pain and tingling, radiating into 4 and 5th fingers. Seemed to improve after 2 hours.Then when he traveled to [place]. Gets intermittent tingling on medial  Volar surface.   Right knee pain  X 2 weeks A lot of walking when he traveled to [place] 7/10 pain level,.  Took Ibuprofen 400mg  am/ pm  X 2 days did help.. And then traveled to [place], [place] and one day at [facility]. [place] pain pain on walking worse in the morning 6/10.   Review of Systems  Musculoskeletal: Positive for joint swelling.       Tingling in left hand radiating to the 4 and 5th fingers Pain onleft knee,  Anterior and  inf.to patella       Objective:   Physical Exam   Swelling and tenderness over lunate bone. Neg Phalens and Tinnel tests     Assessment & Plan:  Left wrist tingling 4th and 5th fingers X-ray of left hand Right knee pain, strained most likely from overuse., velcro wrap Ice, elevate.medications as prescribed. Meds ordered this encounter  Medications  . predniSONE (STERAPRED UNI-PAK 21 TAB) 10 MG (21) TBPK tablet    Sig: Take 6 tablets by mouth today then 5 tablets tomorrow then one less tablet everyday thereafter. Take with food.    Dispense:  21 tablet    Refill:  0  . etodolac (LODINE) 400 MG tablet    Sig: Take 1 tablet (400 mg total) by mouth 2 (two) times daily.    Dispense:  20 tablet    Refill:  3  called pharmacy about  3 refills and cancelled. Return to the clinic in 5-10 days if not improving or if worsening.  Orders Placed This Encounter  Procedures  . DG Hand Complete Left    Standing Status:   Future    Number of Occurrences:   1    Standing Expiration Date:   08/26/2021    Order Specific Question:   Reason for Exam  (SYMPTOM  OR DIAGNOSIS REQUIRED)    Answer:   pain in  base of hand volar surface after pushing off with hand from sitting position    Order Specific Question:   Preferred imaging location?    Answer:   [facility]    Order Specific Question:   Call Results- Best Contact Number?    Answer:   [phone]  Will refer to [facility] walk -n hours discussed. Patient verbalizes understanding a;nd has no questions at discharge.

## 2020-08-26 NOTE — Patient Instructions (Addendum)
Acute Knee Pain, Adult Many things can cause knee pain. Sometimes, knee pain is sudden (acute) and may be caused by damage, swelling, or irritation of the muscles and tissues that support your knee. The pain often goes away on its own with time and rest. If the pain does not go away, tests may be done to find out what is causing the pain. Follow these instructions at home: If you have a knee sleeve or brace:  Wear the knee sleeve or brace as told by your doctor. Take it off only as told by your doctor.  Loosen it if your toes: ? Tingle. ? Become numb. ? Turn cold and blue.  Keep it clean.  If the knee sleeve or brace is not waterproof: ? Do not let it get wet. ? Cover it with a watertight covering when you take a bath or shower.   Activity  Rest your knee.  Do not do things that cause pain or make pain worse.  Avoid activities where both feet leave the ground at the same time (high-impact activities). Examples are running, jumping rope, and doing jumping jacks.  Work with a physical therapist to make a safe exercise program, as told by your doctor. Managing pain, stiffness, and swelling  If told, put ice on the knee. To do this: ? If you have a removable knee sleeve or brace, take it off as told by your doctor. ? Put ice in a plastic bag. ? Place a towel between your skin and the bag. ? Leave the ice on for 20 minutes, 2-3 times a day. ? Take off the ice if your skin turns bright red. This is very important. If you cannot feel pain, heat, or cold, you have a greater risk of damage to the area.  If told, use an elastic bandage to put pressure (compression) on your injured knee.  Raise your knee above the level of your heart while you are sitting or lying down.  Sleep with a pillow under your knee.   General instructions  Take over-the-counter and prescription medicines only as told by your doctor.  Do not smoke or use any products that contain nicotine or tobacco. If you  need help quitting, ask your doctor.  If you are overweight, work with your doctor and a food expert (dietitian) to set goals to lose weight. Being overweight can make your knee hurt more.  Watch for any changes in your symptoms.  Keep all follow-up visits. Contact a doctor if:  The knee pain does not stop.  The knee pain changes or gets worse.  You have a fever along with knee pain.  Your knee is red or feels warm when you touch it.  Your knee gives out or locks up. Get help right away if:  Your knee swells, and the swelling gets worse.  You cannot move your knee.  You have very bad knee pain that does not get better with pain medicine. Summary  Many things can cause knee pain. The pain often goes away on its own with time and rest.  Your doctor may do tests to find out the cause of the pain.  Watch for any changes in your symptoms. Relieve your pain with rest, medicines, light activity, and use of ice.  Get help right away if you cannot move your knee or your knee pain is very bad. This information is not intended to replace advice given to you by your health care provider. Make sure you discuss   any questions you have with your health care provider. Document Revised: 11/07/2019 Document Reviewed: 11/07/2019 Elsevier Patient Education  2021 [employer]vier Inc.  

## 2020-08-29 ENCOUNTER — Telehealth: Payer: Self-pay | Admitting: Nurse Practitioner

## 2020-08-29 NOTE — Telephone Encounter (Signed)
Spoke with patient regarding XRAY results  Negative for fracture or dislocation.    Patient is improving on steroids and has purchased a brace for knee and left hand.   Will return to clinic if symptoms return/worsen or persist   CLINICAL DATA:  Left hand pain after injury 3 weeks ago.  EXAM: LEFT HAND - COMPLETE 3+ VIEW  COMPARISON:  None.  FINDINGS: There is no evidence of fracture or dislocation. There is no evidence of arthropathy or other focal bone abnormality. Soft tissues are unremarkable.  IMPRESSION: Negative.   Electronically Signed   By: Marijo Conception M.D.   On: 08/27/2020 13:12

## 2020-09-01 ENCOUNTER — Encounter: Payer: Self-pay | Admitting: Medical

## 2020-09-09 ENCOUNTER — Other Ambulatory Visit: Payer: Self-pay | Admitting: Nurse Practitioner

## 2020-09-09 DIAGNOSIS — K219 Gastro-esophageal reflux disease without esophagitis: Secondary | ICD-10-CM

## 2020-09-24 ENCOUNTER — Encounter: Payer: Self-pay | Admitting: Medical

## 2020-09-26 ENCOUNTER — Encounter: Payer: Self-pay | Admitting: Medical

## 2020-10-01 ENCOUNTER — Telehealth: Payer: Self-pay | Admitting: Medical

## 2020-10-01 ENCOUNTER — Other Ambulatory Visit: Payer: Self-pay

## 2020-10-01 DIAGNOSIS — J019 Acute sinusitis, unspecified: Secondary | ICD-10-CM

## 2020-10-02 ENCOUNTER — Encounter: Payer: Self-pay | Admitting: Medical

## 2020-10-02 MED ORDER — AZITHROMYCIN 250 MG PO TABS: ORAL_TABLET | ORAL | 0 refills | Status: AC

## 2020-10-02 NOTE — Progress Notes (Signed)
   Subjective:    Patient ID: SERENITY FORTNER, male    DOB: Nov 08, 1985, 35 y.o.   MRN: [id number]  HPI 35 yo male in non acute distress, consents to telemedicne appointment.  History of sinus problems x 10 days. He states he has had pressure and congestion. He does have a history of allergies to pollen. He also has tried Allegra_D x  2 days without much relief. He currently has been taking Allegra and Flonase for more than 1 month. He now has green discharge coming out of his nose. He does feel as if it is better the last 2 days, but is concerned due to the mucous color. He also has sinus pressure, HA, denies fever or chills, cough or chest pain.   Review of Systems  Constitutional: Negative for chills, fatigue and fever.  HENT: Positive for congestion, postnasal drip, sinus pressure, sinus pain and sore throat (mild). Negative for ear pain.   Respiratory: Negative for cough and shortness of breath.   Cardiovascular: Negative for chest pain.       Objective:   Physical Exam AXOX3 No distress noted on phone call.  No physical performed due to telemedicine appointment.     Assessment & Plan:  Sinusitis Meds ordered this encounter  Medications  . azithromycin (ZITHROMAX) 250 MG tablet    Sig: Take 2 tablets on day 1, then 1 tablet daily on days 2 through 5. Take with food    Dispense:  6 tablet    Refill:  0  Try OTC Affrin x 4 days only to help open up nasal passages. Can also do Saline rinses up to  4 times a day to rinse nose. Call in 3-5 days if no improvement. Continue Allegra and Flonase per instructions. Patient verbalizes understanding and has no questions at the end of our conversation.

## 2020-10-09 ENCOUNTER — Encounter: Payer: Self-pay | Admitting: Nurse Practitioner

## 2020-10-09 ENCOUNTER — Other Ambulatory Visit: Payer: Self-pay

## 2020-10-09 ENCOUNTER — Ambulatory Visit: Payer: BC Managed Care – PPO | Admitting: Nurse Practitioner

## 2020-10-09 VITALS — BP 116/84 | HR 93 | Temp 98.0°F | Resp 16 | Ht 77.0 in | Wt 241.0 lb

## 2020-10-09 DIAGNOSIS — J069 Acute upper respiratory infection, unspecified: Secondary | ICD-10-CM

## 2020-10-09 NOTE — Progress Notes (Signed)
   Subjective:    Patient ID: Luis Khan, male    DOB: 09-Feb-1986, 35 y.o.   MRN: [id number]  HPI  35 year old last week had URI symptoms last week two negative tests for COVID. Started on Zpack one week ago took two days and then experienced GI upset. On third dose he did vomit some hours after dosage. Following day he also experienced diarrhea. He stopped the antibiotic after the third dose unsure if that was what was causing GI distress.   Currently using Allegra D and flonase.   Today in clinic he is complaining of some ongoing URI symptoms unsure of next steps. Over the past two days he has been improving, biggest complaint is PND at night that causes him to cough. Sinus pressure is improved. Productive cough is only present in the morning.   Denies recent fever, SOB, or worsening symptoms.   He does have a history of Celiac disease.   Today's Vitals   10/09/20 0923  BP: 116/84  Pulse: 93  Resp: 16  Temp: 98 F (36.7 C)  TempSrc: Tympanic  SpO2: 97%  Weight: 241 lb (109.3 kg)  Height: 6\' 5"  (1.956 m)   Body mass index is 28.58 kg/m.  Review of Systems  Constitutional: Negative.   HENT: Positive for postnasal drip and sore throat.   Eyes: Negative.   Respiratory: Positive for cough.   Cardiovascular: Negative.   Genitourinary: Negative.   Musculoskeletal: Negative.        Objective:   Physical Exam Constitutional:      Appearance: Normal appearance.  HENT:     Head: Normocephalic.     Right Ear: Tympanic membrane, ear canal and external ear normal.     Left Ear: Tympanic membrane, ear canal and external ear normal.     Nose: Nose normal.     Right Turbinates: Pale.     Left Turbinates: Pale.     Mouth/Throat:     Mouth: Mucous membranes are moist.     Pharynx: Oropharynx is clear. No pharyngeal swelling, oropharyngeal exudate or posterior oropharyngeal erythema.     Tonsils: No tonsillar exudate.  Eyes:     Pupils: Pupils are equal, round, and  reactive to light.  Cardiovascular:     Rate and Rhythm: Normal rate and regular rhythm.     Pulses: Normal pulses.     Heart sounds: Normal heart sounds.  Pulmonary:     Effort: Pulmonary effort is normal.     Breath sounds: Normal breath sounds.  Musculoskeletal:     Cervical back: Normal range of motion.  Skin:    General: Skin is warm and dry.  Neurological:     Mental Status: He is alert.           Assessment & Plan:  Advised patient to stop sudafed, continue allegra and use benadryl at night for the next two nights.   Warm salt water gargles, Cepacol lozenges, honey for ST and PND support at night.    Continue flonase.   RTC of message provider if symptoms worsen persist or with new concerns as discussed.

## 2020-10-13 ENCOUNTER — Encounter: Payer: Self-pay | Admitting: Medical

## 2020-10-13 ENCOUNTER — Other Ambulatory Visit: Payer: Self-pay

## 2020-10-13 ENCOUNTER — Ambulatory Visit: Payer: BC Managed Care – PPO | Admitting: Medical

## 2020-10-13 VITALS — BP 120/78 | HR 84 | Temp 97.6°F

## 2020-10-13 DIAGNOSIS — J0101 Acute recurrent maxillary sinusitis: Secondary | ICD-10-CM

## 2020-10-13 MED ORDER — AMOXICILLIN 500 MG PO CAPS: 500.0000 mg | ORAL_CAPSULE | Freq: Three times a day (TID) | ORAL | 0 refills | Status: AC

## 2020-10-13 MED ORDER — PREDNISONE 10 MG (21) PO TBPK: ORAL_TABLET | ORAL | 0 refills | Status: DC

## 2020-10-13 NOTE — Progress Notes (Signed)
Subjective:    Patient ID: Luis Khan, male    DOB: Jan 12, 1986, 35 y.o.   MRN: [id number]  HPI 35 yo male in non acute distress. Returns to clinic  Friday ears popping, head pressure, woke up Saturday with stopped up head, worsening nasal congestion. Slight sore throat on the  Right side this seems to be improving. phelgm coughing up green from thoat. Tickle in his throat with the post nasal drip.  Sick with diarrhea a week on Saturday: cleared up by Monday.  Allergies  Allergen Reactions  . Augmentin [Amoxicillin-Pot Clavulanate]     Naausea, diarrhea stomach cramping.  Marland Kitchen Z-Pak [Azithromycin] Nausea And Vomiting   Long talk with patient about allergies and medications. He is unsure if the Z-pak was indeed an allergy. He would like to try the Amoxil even though he had N/V/D with Augmentin He thinks he has had Amoxil in the past with no side effects. History of Celiac disease  Review of Systems  Constitutional: Negative for chills and fever.  HENT: Positive for congestion, ear pain (not pain but popping alot), postnasal drip, sinus pressure, sinus pain and sore throat.   Respiratory: Positive for cough (from the post nasal drip). Negative for shortness of breath and wheezing.   Gastrointestinal: Negative for abdominal distention, constipation, diarrhea and nausea.  Musculoskeletal: Negative for myalgias.  Allergic/Immunologic: Positive for environmental allergies. Negative for food allergies.  Neurological: Positive for headaches.       Objective:   Physical Exam Vitals and nursing note reviewed.  Constitutional:      Appearance: Normal appearance.  HENT:     Head: Normocephalic and atraumatic.     Nose: Congestion present.     Mouth/Throat:     Mouth: Mucous membranes are moist.     Pharynx: Oropharynx is clear.     Comments: Swollen right tonsil Eyes:     Extraocular Movements: Extraocular movements intact.     Conjunctiva/sclera: Conjunctivae normal.      Pupils: Pupils are equal, round, and reactive to light.  Cardiovascular:     Rate and Rhythm: Normal rate and regular rhythm.  Pulmonary:     Effort: Pulmonary effort is normal.     Breath sounds: Normal breath sounds.  Musculoskeletal:        General: Normal range of motion.     Cervical back: Normal range of motion and neck supple.  Lymphadenopathy:     Cervical: Cervical adenopathy present.  Skin:    General: Skin is warm and dry.  Neurological:     General: No focal deficit present.     Mental Status: He is alert and oriented to person, place, and time. Mental status is at baseline.  Psychiatric:        Mood and Affect: Mood normal.        Behavior: Behavior normal.        Thought Content: Thought content normal.        Judgment: Judgment normal.       erythema  And swelliing of the right tonsil    Assessment & Plan:  Sinusitis  Post nasal drip Pharyngitis  Meds ordered this encounter  Medications  . amoxicillin (AMOXIL) 500 MG capsule    Sig: Take 1 capsule (500 mg total) by mouth 3 (three) times daily for 10 days.    Dispense:  30 capsule    Refill:  0  . predniSONE (STERAPRED UNI-PAK 21 TAB) 10 MG (21) TBPK tablet    Sig:  Take 6 tablets by mouth today then  5 tablets tomorrow and then one less tablet every day there after.    Dispense:  21 tablet    Refill:  0  If allergic reaction like N/V/D to call the office  get in contact with me. Take a Benadryl  50mg . Patient verbalizes understanding and has no questions at discharge.

## 2020-10-20 ENCOUNTER — Other Ambulatory Visit: Payer: Self-pay | Admitting: Medical

## 2020-10-20 ENCOUNTER — Encounter: Payer: Self-pay | Admitting: Medical

## 2020-10-20 NOTE — Progress Notes (Signed)
I did a  90 days prescription of protonix on 09/09/2020. Did you pick it up??

## 2020-11-04 NOTE — Patient Instructions (Signed)

## 2020-12-09 ENCOUNTER — Telehealth: Payer: 59 | Admitting: Nurse Practitioner

## 2020-12-09 DIAGNOSIS — K219 Gastro-esophageal reflux disease without esophagitis: Secondary | ICD-10-CM | POA: Diagnosis not present

## 2020-12-10 ENCOUNTER — Other Ambulatory Visit (HOSPITAL_COMMUNITY): Payer: Self-pay

## 2020-12-10 MED ORDER — OMEPRAZOLE 20 MG PO CPDR: 20.0000 mg | DELAYED_RELEASE_CAPSULE | Freq: Every day | ORAL | 3 refills | Status: DC

## 2020-12-10 MED ORDER — PANTOPRAZOLE SODIUM 40 MG PO TBEC
DELAYED_RELEASE_TABLET | ORAL | 1 refills | Status: DC
Start: 2020-12-10 — End: 2020-12-10

## 2020-12-10 MED ORDER — OMEPRAZOLE 20 MG PO CPDR
20.0000 mg | DELAYED_RELEASE_CAPSULE | Freq: Every day | ORAL | 3 refills | Status: DC
Start: 2020-12-10 — End: 2020-12-10

## 2020-12-10 MED ORDER — PANTOPRAZOLE SODIUM 40 MG PO TBEC
40.0000 mg | DELAYED_RELEASE_TABLET | Freq: Every day | ORAL | 1 refills | Status: DC
  Filled 2020-12-10: qty 90, 90d supply, fill #0
  Filled 2021-03-02: qty 90, 90d supply, fill #1

## 2020-12-10 NOTE — Addendum Note (Signed)
Addended by: Chevis Pretty on: 12/10/2020 11:56 AM   Modules accepted: Orders

## 2020-12-10 NOTE — Progress Notes (Signed)
E-Visit for Heartburn  We are sorry that you are not feeling well.  Here is how we plan to help!  Based on what you shared with me it looks like you most likely have Gastroesophageal Reflux Disease (GERD)  Gastroesophageal reflux disease (GERD) happens when acid from your stomach flows up into the esophagus.  When acid comes in contact with the esophagus, the acid causes sorenss (inflammation) in the esophagus.  Over time, GERD may create small holes (ulcers) in the lining of the esophagus.  I have prescribed protonix 40mg  one by mouth daily until you follow up with a provider.  Your symptoms should improve in the next day or two.  You can use antacids as needed until symptoms resolve.  Call us if your heartburn worsens, you have trouble swallowing, weight loss, spitting up blood or recurrent vomiting.  Home Care: May include lifestyle changes such as weight loss, quitting smoking and alcohol consumption Avoid foods and drinks that make your symptoms worse, such as: Caffeine or alcoholic drinks Chocolate Peppermint or mint flavorings Garlic and onions Spicy foods Citrus fruits, such as oranges, lemons, or limes Tomato-based foods such as sauce, chili, salsa and pizza Fried and fatty foods Avoid lying down for 3 hours prior to your bedtime or prior to taking a nap Eat small, frequent meals instead of a large meals Wear loose-fitting clothing.  Do not wear anything tight around your waist that causes pressure on your stomach. Raise the head of your bed 6 to 8 inches with wood blocks to help you sleep.  Extra pillows will not help.  Seek Help Right Away If: You have pain in your arms, neck, jaw, teeth or back Your pain increases or changes in intensity or duration You develop nausea, vomiting or sweating (diaphoresis) You develop shortness of breath or you faint Your vomit is green, yellow, black or looks like coffee grounds or blood Your stool is red, bloody or black  These symptoms  could be signs of other problems, such as heart disease, gastric bleeding or esophageal bleeding.  Make sure you : Understand these instructions. Will watch your condition. Will get help right away if you are not doing well or get worse.  Your e-visit answers were reviewed by a board certified advanced clinical practitioner to complete your personal care plan.  Depending on the condition, your plan could have included both over the counter or prescription medications.  If there is a problem please reply  once you have received a response from your provider.  Your safety is important to [place].  If you have drug allergies check your prescription carefully.    You can use MyChart to ask questions about today's visit, request a non-urgent call back, or ask for a work or school excuse for 24 hours related to this e-Visit. If it has been greater than 24 hours you will need to follow up with your provider, or enter a new e-Visit to address those concerns.  You will get an e-mail in the next two days asking about your experience.  I hope that your e-visit has been valuable and will speed your recovery. Thank you for using e-visits.  5-10 minutes spent reviewing and documenting in chart.

## 2020-12-26 DIAGNOSIS — F411 Generalized anxiety disorder: Secondary | ICD-10-CM | POA: Diagnosis not present

## 2020-12-29 ENCOUNTER — Other Ambulatory Visit (HOSPITAL_COMMUNITY): Payer: Self-pay

## 2020-12-29 ENCOUNTER — Ambulatory Visit: Payer: 59 | Admitting: Family Medicine

## 2020-12-29 ENCOUNTER — Other Ambulatory Visit: Payer: Self-pay

## 2020-12-29 VITALS — BP 122/84 | Ht 77.0 in | Wt 240.0 lb

## 2020-12-29 DIAGNOSIS — M25562 Pain in left knee: Secondary | ICD-10-CM | POA: Diagnosis not present

## 2020-12-29 DIAGNOSIS — G8929 Other chronic pain: Secondary | ICD-10-CM | POA: Diagnosis not present

## 2020-12-29 MED ORDER — DICLOFENAC SODIUM 75 MG PO TBEC
75.0000 mg | DELAYED_RELEASE_TABLET | Freq: Two times a day (BID) | ORAL | 1 refills | Status: DC
  Filled 2020-12-29: qty 60, 30d supply, fill #0
  Filled 2021-01-28: qty 60, 30d supply, fill #1

## 2020-12-29 MED ORDER — NITROGLYCERIN 0.2 MG/HR TD PT24
MEDICATED_PATCH | TRANSDERMAL | 1 refills | Status: DC
  Filled 2020-12-29: qty 7, 28d supply, fill #0
  Filled 2020-12-30: qty 22, 88d supply, fill #0
  Filled 2020-12-30: qty 22, 88d supply, fill #1
  Filled 2020-12-30 (×2): qty 22, 88d supply, fill #0
  Filled 2021-03-19: qty 22, 88d supply, fill #1

## 2020-12-29 NOTE — Patient Instructions (Signed)
You have patellar tendinitis of your right knee. Ice area 15 minutes at a time 3-4 times a day as needed. Diclofenac '75mg'$  twice a day with food for pain and inflammation. Consider physical therapy. Start nitro patches 1/4th patch to affected knee, change daily Follow up with me in 1 month to 6 weeks but let me know sooner if you're struggling.  Get x-rays of your left knee - you have a large effusion (fluid in the knee). Take diclofenac as above. Compression sleeve when up and walking around. Consider drainage of this with or without steroid injection as we discussed.

## 2020-12-30 ENCOUNTER — Encounter: Payer: Self-pay | Admitting: Family Medicine

## 2020-12-30 ENCOUNTER — Ambulatory Visit
Admission: RE | Admit: 2020-12-30 | Discharge: 2020-12-30 | Disposition: A | Discharge status: Home / Self Care | Payer: 59 | Source: Ambulatory Visit | Attending: Family Medicine | Admitting: Family Medicine

## 2020-12-30 ENCOUNTER — Other Ambulatory Visit (HOSPITAL_COMMUNITY): Payer: Self-pay

## 2020-12-30 DIAGNOSIS — M25562 Pain in left knee: Secondary | ICD-10-CM | POA: Diagnosis not present

## 2020-12-30 DIAGNOSIS — M7989 Other specified soft tissue disorders: Secondary | ICD-10-CM | POA: Diagnosis not present

## 2020-12-30 DIAGNOSIS — G8929 Other chronic pain: Secondary | ICD-10-CM

## 2020-12-30 NOTE — Progress Notes (Signed)
PCP: Talmage Nap, PA-C  Subjective:   HPI: Patient is a 35 y.o. male here for bilateral knee pain.  Patient reports he's had pain in right knee especially since March. Recalls walking in [place] and developed pain inferior to right kneecap. No swelling or bruising. Tried patellar strap, some exercises he learned from a physical therapist friend. Still dealing with this but then 1.5 months ago started to get a different pain in left knee. Pain aching at nighttime. Takes about an hour in the morning to ease up and get going. Associated swelling of the left knee. Home exercises have helped some with this. Has tried ibuprofen. No other joint issues.  Past Medical History:  Diagnosis Date   Allergy    Blood transfusion without reported diagnosis    Chronic allergic rhinitis    GERD (gastroesophageal reflux disease) 10/03/2014   NASH (nonalcoholic steatohepatitis)    Premature labor, onset of delivery before 37 weeks of gestation, fetus 5 07/09/2015   Psoriasis 09/2814    Current Outpatient Medications on File Prior to Visit  Medication Sig Dispense Refill   fexofenadine (ALLEGRA) 180 MG tablet Take 180 mg by mouth every morning.      fluticasone (FLONASE) 50 MCG/ACT nasal spray Place 1 spray into both nostrils 2 (two) times daily.     ketoconazole (NIZORAL) 2 % cream Apply 1 application topically daily as needed (for psoriasis).     Multiple Vitamin (MULTIVITAMIN WITH MINERALS) TABS tablet Take 1 tablet by mouth daily. Men's One-A-Day     pantoprazole (PROTONIX) 40 MG tablet Take 1 tablet (40 mg total) by mouth daily. 90 tablet 1   No current facility-administered medications on file prior to visit.    Past Surgical History:  Procedure Laterality Date   CHOLECYSTECTOMY N/A 07/23/2016   Procedure: LAPAROSCOPIC CHOLECYSTECTOMY WITH INTRAOPERATIVE CHOLANGIOGRAM;  Surgeon: Leonie Green, MD;  Location: [facility] ORS;  Service: General;  Laterality: N/A;    ESOPHAGOGASTRODUODENOSCOPY (EGD) WITH PROPOFOL N/A 08/01/2015   Procedure: ESOPHAGOGASTRODUODENOSCOPY (EGD) WITH PROPOFOL;  Surgeon: Lollie Sails, MD;  Location: [facility] ENDOSCOPY;  Service: Endoscopy;  Laterality: N/A;   WISDOM TOOTH EXTRACTION      Allergies  Allergen Reactions   Augmentin [Amoxicillin-Pot Clavulanate]     Naausea, diarrhea stomach cramping.   Z-Pak [Azithromycin] Nausea And Vomiting    Social History   Socioeconomic History   Marital status: Married    Spouse name: Not on file   Number of children: Not on file   Years of education: Not on file   Highest education level: Not on file  Occupational History   Not on file  Tobacco Use   Smoking status: Never   Smokeless tobacco: Never  Vaping Use   Vaping Use: Never used  Substance and Sexual Activity   Alcohol use: Yes    Alcohol/week: 1.0 standard drink    Types: 1 Cans of beer per week    Comment: occasionally/ special events   Drug use: No   Sexual activity: Yes  Other Topics Concern   Not on file  Social History Narrative   Not on file   Social Determinants of Health   Financial Resource Strain: Not on file  Food Insecurity: Not on file  Transportation Needs: Not on file  Physical Activity: Not on file  Stress: Not on file  Social Connections: Not on file  Intimate Partner Violence: Not on file    Family History  Problem Relation Age of Onset   Irritable bowel  syndrome Mother    Kidney Stones Mother    Hypertension Father    Gallstones Father    Hyperlipidemia Father    Congestive Heart Failure Paternal Grandfather    Coronary artery disease Paternal Grandfather    Heart attack Paternal Grandfather    Diabetes Paternal Grandfather    Gallbladder disease Maternal Aunt    Kidney cancer Maternal Aunt    Diabetes Mellitus II Maternal Uncle    Pancreatic cancer Maternal Grandmother    Pancreatic disease Maternal Grandmother    Dementia Maternal Grandfather    Heart attack Maternal  Grandfather    Gallstones Paternal Grandmother    Drug abuse Paternal Grandmother    Hypertension Paternal Grandmother    Kidney Stones Paternal Grandmother     BP 122/84   Ht '6\' 5"'$  (1.956 m)   Wt 240 lb (108.9 kg)   BMI 28.46 kg/m   [facility] 12/29/2020  Frequency of aerobic exercise (# of days/week) 0  Average time in minutes 0  Frequency of strengthening activities (# of days/week) 5    No flowsheet data found.  Review of Systems: See HPI above.     Objective:  Physical Exam:  Gen: NAD, comfortable in exam room  Right knee: No gross deformity, ecchymoses, swelling. TTP patellar tendon.  No joint line, other tenderness. FROM with normal strength. Negative ant/post drawers. Negative valgus/varus testing. Negative lachman. Negative mcmurrays, apleys. NV intact distally.  Left knee: Moderate effusion.  No other gross deformity, ecchymoses. Mild TTP suprapatellar pouch.  No joint line, other tenderness. ROM 0 - 120 degrees with full strength. Negative ant/post drawers. Negative valgus/varus testing. Negative lachman. Negative mcmurrays, apleys. Equivocal thessalys. NV intact distally.   Assessment & Plan:  1. Right patellar tendinitis - icing, diclofenac, home exercises.  Pain is chronic - will try nitro patches as well since he's been doing some of the rehab and still dealing with this for several months.  2. Left knee pain - with moderate effusion.  Will obtain radiographs to assess for underlying arthropathy.  His exam is relatively benign.  Start diclofenac, compression sleeve, home exercises.  Consider aspiration with fluid analysis as next step if he's not improving.  No other joint involvement.  F/u in 1 month to 6 weeks otherwise.

## 2021-01-06 DIAGNOSIS — F411 Generalized anxiety disorder: Secondary | ICD-10-CM | POA: Diagnosis not present

## 2021-01-21 DIAGNOSIS — F411 Generalized anxiety disorder: Secondary | ICD-10-CM | POA: Diagnosis not present

## 2021-01-28 ENCOUNTER — Other Ambulatory Visit: Payer: Self-pay

## 2021-01-28 ENCOUNTER — Ambulatory Visit (INDEPENDENT_AMBULATORY_CARE_PROVIDER_SITE_OTHER): Payer: 59 | Admitting: Family Medicine

## 2021-01-28 ENCOUNTER — Encounter: Payer: Self-pay | Admitting: Family Medicine

## 2021-01-28 ENCOUNTER — Other Ambulatory Visit (HOSPITAL_COMMUNITY): Payer: Self-pay

## 2021-01-28 VITALS — Ht 77.0 in | Wt 240.0 lb

## 2021-01-28 DIAGNOSIS — M25462 Effusion, left knee: Secondary | ICD-10-CM | POA: Diagnosis not present

## 2021-01-28 DIAGNOSIS — M25562 Pain in left knee: Secondary | ICD-10-CM | POA: Diagnosis not present

## 2021-01-28 DIAGNOSIS — G8929 Other chronic pain: Secondary | ICD-10-CM

## 2021-01-28 MED ORDER — METHYLPREDNISOLONE ACETATE 40 MG/ML IJ SUSP
40.0000 mg | Freq: Once | INTRAMUSCULAR | Status: AC
  Administered 2021-01-28: 40 mg via INTRA_ARTICULAR

## 2021-01-28 NOTE — Patient Instructions (Signed)
Continue the nitro patches and home exercises for your right knee.  We aspirated and injected your left knee - we will send the fluid off for analysis as well. We will go ahead with MRI of this knee - if you're feeling great you can cancel this. Continue with compression sleeve, home exercises, icing if needed. Follow up with me in 5-6 weeks but I will call you after the MRI.

## 2021-01-28 NOTE — Progress Notes (Signed)
PCP: Talmage Nap, PA-C  Subjective:   HPI: Patient is a 35 y.o. male here for bilateral knee pain.  7/25: Patient reports he's had pain in right knee especially since March. Recalls walking in [place] and developed pain inferior to right kneecap. No swelling or bruising. Tried patellar strap, some exercises he learned from a physical therapist friend. Still dealing with this but then 1.5 months ago started to get a different pain in left knee. Pain aching at nighttime. Takes about an hour in the morning to ease up and get going. Associated swelling of the left knee. Home exercises have helped some with this. Has tried ibuprofen. No other joint issues.  8/24: Patient reports right knee is improving and doing well with nitro patches, home exercises. Left knee is less swollen but pain is about the same. He was able to walk at the [facility] but week after especially on Tuesday pain was pretty severe. This eased up in a couple days but then he also had an episode in grocery store where knee buckled. Pain more localized mainly to lateral aspect of knee though some medial. No true locking, catching. Using compression sleeve which has helped and doing home exercises for this knee also.  Past Medical History:  Diagnosis Date   Allergy    Blood transfusion without reported diagnosis    Chronic allergic rhinitis    GERD (gastroesophageal reflux disease) 10/03/2014   NASH (nonalcoholic steatohepatitis)    Premature labor, onset of delivery before 37 weeks of gestation, fetus 5 07/09/2015   Psoriasis 09/2814    Current Outpatient Medications on File Prior to Visit  Medication Sig Dispense Refill   diclofenac (VOLTAREN) 75 MG EC tablet Take 1 tablet (75 mg total) by mouth 2 (two) times daily. 60 tablet 1   fexofenadine (ALLEGRA) 180 MG tablet Take 180 mg by mouth every morning.      fluticasone (FLONASE) 50 MCG/ACT nasal spray Place 1 spray into both nostrils 2 (two) times daily.      ketoconazole (NIZORAL) 2 % cream Apply 1 application topically daily as needed (for psoriasis).     Multiple Vitamin (MULTIVITAMIN WITH MINERALS) TABS tablet Take 1 tablet by mouth daily. Men's One-A-Day     nitroGLYCERIN (NITRODUR - DOSED IN MG/24 HR) 0.2 mg/hr patch Apply 1/4th patch to right knee, change daily 30 patch 1   pantoprazole (PROTONIX) 40 MG tablet Take 1 tablet (40 mg total) by mouth daily. 90 tablet 1   No current facility-administered medications on file prior to visit.    Past Surgical History:  Procedure Laterality Date   CHOLECYSTECTOMY N/A 07/23/2016   Procedure: LAPAROSCOPIC CHOLECYSTECTOMY WITH INTRAOPERATIVE CHOLANGIOGRAM;  Surgeon: Leonie Green, MD;  Location: [facility] ORS;  Service: General;  Laterality: N/A;   ESOPHAGOGASTRODUODENOSCOPY (EGD) WITH PROPOFOL N/A 08/01/2015   Procedure: ESOPHAGOGASTRODUODENOSCOPY (EGD) WITH PROPOFOL;  Surgeon: Lollie Sails, MD;  Location: [facility] ENDOSCOPY;  Service: Endoscopy;  Laterality: N/A;   WISDOM TOOTH EXTRACTION      Allergies  Allergen Reactions   Augmentin [Amoxicillin-Pot Clavulanate]     Naausea, diarrhea stomach cramping.   Z-Pak [Azithromycin] Nausea And Vomiting    Social History   Socioeconomic History   Marital status: Married    Spouse name: Not on file   Number of children: Not on file   Years of education: Not on file   Highest education level: Not on file  Occupational History   Not on file  Tobacco Use   Smoking status:  Never   Smokeless tobacco: Never  Vaping Use   Vaping Use: Never used  Substance and Sexual Activity   Alcohol use: Yes    Alcohol/week: 1.0 standard drink    Types: 1 Cans of beer per week    Comment: occasionally/ special events   Drug use: No   Sexual activity: Yes  Other Topics Concern   Not on file  Social History Narrative   Not on file   Social Determinants of Health   Financial Resource Strain: Not on file  Food Insecurity: Not on file  Transportation  Needs: Not on file  Physical Activity: Not on file  Stress: Not on file  Social Connections: Not on file  Intimate Partner Violence: Not on file    Family History  Problem Relation Age of Onset   Irritable bowel syndrome Mother    Kidney Stones Mother    Hypertension Father    Gallstones Father    Hyperlipidemia Father    Congestive Heart Failure Paternal Grandfather    Coronary artery disease Paternal Grandfather    Heart attack Paternal Grandfather    Diabetes Paternal Grandfather    Gallbladder disease Maternal Aunt    Kidney cancer Maternal Aunt    Diabetes Mellitus II Maternal Uncle    Pancreatic cancer Maternal Grandmother    Pancreatic disease Maternal Grandmother    Dementia Maternal Grandfather    Heart attack Maternal Grandfather    Gallstones Paternal Grandmother    Drug abuse Paternal Grandmother    Hypertension Paternal Grandmother    Kidney Stones Paternal Grandmother     Ht '6\' 5"'$  (1.956 m)   Wt 240 lb (108.9 kg)   BMI 28.46 kg/m   [facility] Adult Exercise 12/29/2020 01/28/2021  Frequency of aerobic exercise (# of days/week) 0 3  Average time in minutes 0 30  Frequency of strengthening activities (# of days/week) 5 1    No flowsheet data found.  Review of Systems: See HPI above.     Objective:  Physical Exam:  Gen: NAD, comfortable in exam room  Left knee: Moderate effusion.  No other gross deformity, ecchymoses. Mild TTP lateral > medial joint line.  No patellar facet tenderness. FROM with normal strength. Negative ant/post drawers. Negative valgus/varus testing. Negative lachman. Negative apleys.  Positive thessalys, mildly positive mcmurrays. NV intact distally.   Assessment & Plan:  1. Left knee pain - not improving with diclofenac, home exercises, compression sleeve.  Radiographs negative.  Will go ahead with MRI to assess for meniscus tear - testing consistent with this as likely cause today.  Aspiration and injection  performed today and will send fluid for culture, cell count, crystals.  After informed written consent timeout was performed, patient was lying supine on exam table.  Left knee was prepped with alcohol swab.  Utilizing superolateral approach, 3 mL of lidocaine was used for local anesthesia.  Then using an 18g needle on 60cc syringe,~63 mL of clear straw-colored fluid was aspirated from left knee.  Knee was then injected with 3:1 lidocaine:depomedrol.  Patient tolerated procedure well without immediate complication

## 2021-01-29 LAB — SYNOVIAL FLUID, CELL COUNT
Eos, Fluid: 0 %
Lining Cells, Synovial: 0 %
Lymphs, Fluid: 12 %
Macrophages Fld: 7 %
Nuc cell # Fld: 4067 cells/uL — ABNORMAL HIGH (ref 0–200)
Polys, Fluid: 81 %
RBC, Fluid: 4000 /uL

## 2021-02-01 LAB — BODY FLUID CULTURE

## 2021-02-02 ENCOUNTER — Ambulatory Visit: Payer: 59 | Admitting: Physician Assistant

## 2021-02-04 DIAGNOSIS — F411 Generalized anxiety disorder: Secondary | ICD-10-CM | POA: Diagnosis not present

## 2021-02-05 ENCOUNTER — Ambulatory Visit: Payer: 59 | Admitting: Physician Assistant

## 2021-02-10 ENCOUNTER — Other Ambulatory Visit: Payer: Self-pay

## 2021-02-10 ENCOUNTER — Ambulatory Visit
Admission: RE | Admit: 2021-02-10 | Discharge: 2021-02-10 | Disposition: A | Discharge status: Home / Self Care | Payer: 59 | Source: Ambulatory Visit | Attending: Family Medicine | Admitting: Family Medicine

## 2021-02-10 DIAGNOSIS — M25562 Pain in left knee: Secondary | ICD-10-CM

## 2021-02-10 DIAGNOSIS — G8929 Other chronic pain: Secondary | ICD-10-CM

## 2021-02-10 DIAGNOSIS — M7122 Synovial cyst of popliteal space [Baker], left knee: Secondary | ICD-10-CM | POA: Diagnosis not present

## 2021-02-10 DIAGNOSIS — M25462 Effusion, left knee: Secondary | ICD-10-CM | POA: Diagnosis not present

## 2021-02-10 DIAGNOSIS — R600 Localized edema: Secondary | ICD-10-CM | POA: Diagnosis not present

## 2021-02-11 ENCOUNTER — Ambulatory Visit: Payer: 59 | Admitting: Family Medicine

## 2021-02-12 ENCOUNTER — Other Ambulatory Visit: Payer: Self-pay

## 2021-02-12 DIAGNOSIS — M25462 Effusion, left knee: Secondary | ICD-10-CM

## 2021-02-12 NOTE — Telephone Encounter (Signed)
Please go ahead with ordering ESR, CRP, ANA, Rheumatoid factor and let him know how to get over to [facility] to get these done.  Thank you!

## 2021-02-17 DIAGNOSIS — T7840XA Allergy, unspecified, initial encounter: Secondary | ICD-10-CM | POA: Insufficient documentation

## 2021-02-17 DIAGNOSIS — H53131 Sudden visual loss, right eye: Secondary | ICD-10-CM | POA: Insufficient documentation

## 2021-02-18 DIAGNOSIS — F411 Generalized anxiety disorder: Secondary | ICD-10-CM | POA: Diagnosis not present

## 2021-02-24 DIAGNOSIS — M25462 Effusion, left knee: Secondary | ICD-10-CM | POA: Diagnosis not present

## 2021-02-25 LAB — ANA: Anti Nuclear Antibody (ANA): NEGATIVE

## 2021-02-25 LAB — SEDIMENTATION RATE: Sed Rate: 4 mm/hr (ref 0–15)

## 2021-02-25 LAB — C-REACTIVE PROTEIN: CRP: 5 mg/L (ref 0–10)

## 2021-02-25 LAB — RHEUMATOID FACTOR: Rheumatoid fact SerPl-aCnc: <10 IU/mL (ref <14.0)

## 2021-03-02 ENCOUNTER — Other Ambulatory Visit (HOSPITAL_COMMUNITY): Payer: Self-pay

## 2021-03-04 ENCOUNTER — Ambulatory Visit: Payer: 59 | Admitting: Family Medicine

## 2021-03-04 ENCOUNTER — Encounter: Payer: Self-pay | Admitting: Family Medicine

## 2021-03-04 DIAGNOSIS — G8929 Other chronic pain: Secondary | ICD-10-CM | POA: Diagnosis not present

## 2021-03-04 DIAGNOSIS — M25562 Pain in left knee: Secondary | ICD-10-CM

## 2021-03-04 DIAGNOSIS — M659 Synovitis and tenosynovitis, unspecified: Secondary | ICD-10-CM | POA: Insufficient documentation

## 2021-03-04 DIAGNOSIS — M25561 Pain in right knee: Secondary | ICD-10-CM | POA: Diagnosis not present

## 2021-03-04 NOTE — Assessment & Plan Note (Signed)
Exam and tenderness with flexion of this muscle against resistance consistent with diagnosis.  Consider TFCC however provocative movements not suggestive pathology there.  Ultrasound shows inflammatory signs surrounding FCU tendon. Will continue to wean patient's oral Voltaren as he has been on for an extended amount of time Will refer patient for formal physical therapy, discussed importance of occupational therapy given wrist pathology, patient agreeable with this plan Counseled patient on using standard wrist brace

## 2021-03-04 NOTE — Patient Instructions (Signed)
You initial strained the flexor carpi ulnaris tendon and now have tendinopathy here. Wrist brace during the day when possible. Icing 15 minutes at a time 3-4 times a day. Continue the voltaren but hopefully we can stop this soon. Do home exercises out of the brace once a day.  Continue nitro patches for the right knee. Add formal physical therapy for both knees. Follow up with me in about 6 weeks.

## 2021-03-04 NOTE — Assessment & Plan Note (Signed)
Patient beginning to have signs of bilateral knee pain that is improved since initially presenting.  Patient reports improved range of motion bilaterally  Good response with PO Voltaren and nitroglycerin patches Will refer for formal physical therapy, patient agreeable with this plan

## 2021-03-04 NOTE — Progress Notes (Signed)
PCP: Talmage Nap, PA-C  Subjective:   HPI: Patient is a 35 y.o. male here for left wrist pain and follow up for knee pain.  Knee Pain Patient states that overall his knee pain on the left as well as the right has improved.  He states that he has been taking the Voltaren orally for about 2 months states that he does notice a difference in his discomfort level when he does not take this medication.  Patient reports that his knees feel more achy when he does not take the medication.  He reports that range of motion has improved since he initially had the knee pain.  Patient would like to return to exercise as he reports feeling tightness in his IT band on the left.  He states that he has not had any swelling since his joint aspiration and injection.   Left Wrist Pain  Patient reports that he has been having left ulnar wrist pain since April.  He reports that he was playing with his toddler, pushed up with his left hand to get up when he first noticed the pain and is not aware of any of their specific injury prior to this.  He reports that pain is localized to the ulnar wrist on the volar side.  He reports tenderness with palpation of a specific spot.  He denies any decreased range of motion.  He denies any electrical shocklike pain or radiation of his symptoms.  Patient states that pain worsened most recently after playing golf for 2 days in a row.  He denies any weakness in his left hand and has no left arm pain associated with the pain in his wrist.  He states that taking the oral Voltaren does help to calm down any pain in his left wrist.  He has not noticed any redness or swelling.     Objective:  Physical Exam:  Gen: awake, alert, NAD, comfortable in exam room Pulm: breathing unlabored  Left Hand/Wrist: Inspection: No obvious deformity. No swelling, erythema or bruising Palpation:  TTP of flexor carpi ulnaris tendon at insertion, no tenderness along ECU tendonROM: Full ROM of the  digits and wrist; pain with wrist flexion and ulnar deviation Strength: 5/5 strength in the forearm, wrist and interosseus muscles Neurovascular: NV intact  Bilateral Knees: - Inspection: no gross deformity. No swelling/effusion, erythema or bruising. Skin intact, nitro patch on right knee  - Palpation: no TTP - ROM: full active ROM with flexion and extension in knee and hip - Strength: 5/5 strength - Neuro/vasc: NV intact - Special Tests: - LIGAMENTS: negative anterior and posterior drawer, negative Lachman's, no MCL or LCL laxity  -- PF JOINT: nml patellar mobility bilaterally.     Assessment & Plan:    FCU (flexor carpi ulnaris) tenosynovitis Exam and tenderness with flexion of this muscle against resistance consistent with diagnosis.  Consider TFCC however provocative movements not suggestive pathology there.  Ultrasound shows inflammatory signs surrounding FCU tendon. Will continue to wean patient's oral Voltaren as he has been on for an extended amount of time Will refer patient for formal physical therapy, discussed importance of occupational therapy given wrist pathology, patient agreeable with this plan Counseled patient on using standard wrist brace  Knee pain, chronic Patient beginning to have signs of bilateral knee pain that is improved since initially presenting.  Patient reports improved range of motion bilaterally  Good response with PO Voltaren and nitroglycerin patches Will refer for formal physical therapy, patient agreeable with  this plan   No orders of the defined types were placed in this encounter.   No orders of the defined types were placed in this encounter.   Eulis Foster, MD [place], PGY-3 03/04/2021 9:35 AM

## 2021-03-05 DIAGNOSIS — F411 Generalized anxiety disorder: Secondary | ICD-10-CM | POA: Diagnosis not present

## 2021-03-10 DIAGNOSIS — F411 Generalized anxiety disorder: Secondary | ICD-10-CM | POA: Diagnosis not present

## 2021-03-16 DIAGNOSIS — M25561 Pain in right knee: Secondary | ICD-10-CM | POA: Diagnosis not present

## 2021-03-16 DIAGNOSIS — M62552 Muscle wasting and atrophy, not elsewhere classified, left thigh: Secondary | ICD-10-CM | POA: Diagnosis not present

## 2021-03-16 DIAGNOSIS — M25562 Pain in left knee: Secondary | ICD-10-CM | POA: Diagnosis not present

## 2021-03-16 DIAGNOSIS — M25461 Effusion, right knee: Secondary | ICD-10-CM | POA: Diagnosis not present

## 2021-03-16 DIAGNOSIS — M25462 Effusion, left knee: Secondary | ICD-10-CM | POA: Diagnosis not present

## 2021-03-16 DIAGNOSIS — M62551 Muscle wasting and atrophy, not elsewhere classified, right thigh: Secondary | ICD-10-CM | POA: Diagnosis not present

## 2021-03-17 ENCOUNTER — Other Ambulatory Visit: Payer: Self-pay

## 2021-03-17 ENCOUNTER — Encounter (HOSPITAL_BASED_OUTPATIENT_CLINIC_OR_DEPARTMENT_OTHER): Payer: Self-pay | Admitting: Family Medicine

## 2021-03-17 ENCOUNTER — Ambulatory Visit (HOSPITAL_BASED_OUTPATIENT_CLINIC_OR_DEPARTMENT_OTHER): Payer: 59 | Admitting: Family Medicine

## 2021-03-17 VITALS — BP 120/84 | HR 68 | Ht 77.0 in | Wt 244.6 lb

## 2021-03-17 DIAGNOSIS — J3089 Other allergic rhinitis: Secondary | ICD-10-CM | POA: Diagnosis not present

## 2021-03-17 DIAGNOSIS — L409 Psoriasis, unspecified: Secondary | ICD-10-CM

## 2021-03-17 DIAGNOSIS — Z0000 Encounter for general adult medical examination without abnormal findings: Secondary | ICD-10-CM

## 2021-03-17 NOTE — Assessment & Plan Note (Signed)
Long history of environmental and seasonal allergies. Current symptoms not well controlled with use of first-line measures of intranasal steroid spray and oral antihistamine Will refer to ENT for further evaluation and treatment recommendations

## 2021-03-17 NOTE — Patient Instructions (Signed)
  Medication Instructions:  Your physician recommends that you continue on your current medications as directed. Please refer to the Current Medication list given to you today. --If you need a refill on any your medications before your next appointment, please call your pharmacy first. If no refills are authorized on file call the office.-- Lab Work: Your physician has recommended that you have lab work 1 [place] - CBC, CMP, LIPID  If you have labs (blood work) drawn today and your tests are completely normal, you will receive your results via [facility] a phone call from our staff.  Please ensure you check your voicemail in the event that you authorized detailed messages to be left on a delegated number. If you have any lab test that is abnormal or we need to change your treatment, we will call you to review the results.  Referrals/Procedures/Imaging: A referral has been placed for you to [facility] for evaluation and treatment. Someone from the scheduling department will be in contact with you in regards to coordinating your consultation. If you do not hear from any of the schedulers within 7-10 business days please give their office a call.  [facility] [place] [phone]  A referral has been placed for you to an [place] and [facility]ro[facility] Specialist for evaluation and treatment. Someone from the scheduling department will be in contact with you in regards to coordinating your consultation. If you do not hear from any of the schedulers within 7-10 business days please give their office a call.  Follow-Up: Your next appointment:   Your physician recommends that you schedule a follow-up appointment in: 3-4 MONTHS FOR CPE with Dr. de [place]  You will receive a text message or e-mail with a link to a survey about your care and experience with [place] today! We would greatly appreciate your feedback!   Thanks for letting us be  apart of your health journey!!  Primary Care and Sports Medicine   Dr. Arlina Robes [place]   We encourage you to activate your patient portal called "MyChart".  Sign up information is provided on this After Visit Summary.  MyChart is used to connect with patients for Virtual Visits (Telemedicine).  Patients are able to view lab/test results, encounter notes, upcoming appointments, etc.  Non-urgent messages can be sent to your provider as well. To learn more about what you can do with MyChart, please visit --  [email or web address].

## 2021-03-17 NOTE — Assessment & Plan Note (Signed)
Given continued symptoms despite use of topical corticosteroid cream, will refer to dermatology for further evaluation and management

## 2021-03-17 NOTE — Progress Notes (Signed)
New Patient Office Visit  Subjective:  Patient ID: Luis Khan, male    DOB: [birth year]  Age: 35 y.o. MRN: [id number]  CC:  Chief Complaint  Patient presents with   Establish Care    Prior PCP [place] (notes in epic)   Marion has suffered with environmental allergies for his entire life. He feels like they are worsening. He states he has chronic sinusitis, pharyngitis, laryngitis, and tonsillitis. He does admit to having mold in his home which could be a contributing factor but he is curious as to whether he needs to see an allergist or ENT    Bumps    Patient states he has some bumps on his left foot that have been present for about a week and have been itching. He has treated with topical cortisone cream with some relief. He admits they may be big bites but is unsure.    Psoriasis    Patient has a topical (clobetasol) that he has been using for treatment but is noticing more spots appearing in areas that he hadnt had them in the past and is wondering if treatment is therapeutic     HPI Luis Khan is a 35 year old male presenting to establish in clinic.  Current concerns today as outlined above.  Reports past medical history of psoriasis, allergy issues.  Has been seeing Dr. Barbaraann Barthel recently for knee pain as well as wrist pain.  Psoriasis: Has been utilizing topical corticosteroid cream as needed for lesions.  Feels that use of cream has kept lesions from worsening, however no specific improvement otherwise recently  Allergies: Reports having issues with environmental and seasonal allergies since he was young.  In the past he has seen specialist regarding this and was receiving injections to help control symptoms.  As an adult, he has mostly done well with intranasal steroid spray and oral antihistamine, however in the last couple years symptoms have been worsening.  He also reports fairly regular issues with pharyngitis, tonsillitis and laryngitis.  Patient  currently works for [employer] within [place] office.  Outside of work he enjoys playing golf, sports in general, watching movies. Thinks that his last physical exam was last year  Past Medical History:  Diagnosis Date   Allergy    Anxiety    Blood transfusion without reported diagnosis    Chronic allergic rhinitis    GERD (gastroesophageal reflux disease) 10/03/2014   NASH (nonalcoholic steatohepatitis)    Premature labor, onset of delivery before 37 weeks of gestation, fetus 5 07/09/2015   Psoriasis 09/2814    Past Surgical History:  Procedure Laterality Date   CHOLECYSTECTOMY N/A 07/23/2016   Procedure: LAPAROSCOPIC CHOLECYSTECTOMY WITH INTRAOPERATIVE CHOLANGIOGRAM;  Surgeon: Leonie Green, MD;  Location: [facility] ORS;  Service: General;  Laterality: N/A;   ESOPHAGOGASTRODUODENOSCOPY (EGD) WITH PROPOFOL N/A 08/01/2015   Procedure: ESOPHAGOGASTRODUODENOSCOPY (EGD) WITH PROPOFOL;  Surgeon: Lollie Sails, MD;  [facility] ENDOSCOPY;  Service: Endoscopy;  Laterality: N/A;   WISDOM TOOTH EXTRACTION      Family History  Problem Relation Age of Onset   Irritable bowel syndrome Mother    Kidney Stones Mother    Hypertension Father    Gallstones Father    Hyperlipidemia Father    Congestive Heart Failure Paternal Grandfather    Coronary artery disease Paternal Grandfather    Heart attack Paternal Grandfather    Diabetes Paternal Grandfather    Gallbladder disease Maternal Aunt    Kidney cancer Maternal Aunt  Diabetes Mellitus II Maternal Uncle    Pancreatic cancer Maternal Grandmother    Pancreatic disease Maternal Grandmother    Cancer Maternal Grandmother    Dementia Maternal Grandfather    Heart attack Maternal Grandfather    Gallstones Paternal Grandmother    Drug abuse Paternal Grandmother    Hypertension Paternal Grandmother    Kidney Stones Paternal Grandmother    Diabetes Maternal Uncle     Social History   Socioeconomic History   Marital  status: Married    Spouse name: Not on file   Number of children: Not on file   Years of education: Not on file   Highest education level: Not on file  Occupational History   Not on file  Tobacco Use   Smoking status: Never   Smokeless tobacco: Never  Vaping Use   Vaping Use: Never used  Substance and Sexual Activity   Alcohol use: Yes    Alcohol/week: 3.0 standard drinks    Types: 2 Glasses of wine, 1 Standard drinks or equivalent per week    Comment: occasionally/ special events   Drug use: No   Sexual activity: Yes    Birth control/protection: None    Comment: Wife has an IUD  Other Topics Concern   Not on file  Social History Narrative   Not on file   Social Determinants of Health   Financial Resource Strain: Not on file  Food Insecurity: Not on file  Transportation Needs: Not on file  Physical Activity: Not on file  Stress: Not on file  Social Connections: Not on file  Intimate Partner Violence: Not on file    Objective:   Today's Vitals: BP 120/84   Pulse 68   Ht 6\' 5"  (1.956 m)   Wt 244 lb 9.6 oz (110.9 kg)   SpO2 99%   BMI 29.01 kg/m   Physical Exam  35 year old male in no acute distress Cardiovascular exam with regular rate and rhythm, no murmurs appreciated Lungs clear to auscultation bilaterally  Assessment & Plan:   Problem List Items Addressed This Visit       Musculoskeletal and Integument   Psoriasis    Given continued symptoms despite use of topical corticosteroid cream, will refer to dermatology for further evaluation and management      Relevant Orders   Ambulatory referral to Dermatology     Other   Environmental and seasonal allergies - Primary    Long history of environmental and seasonal allergies. Current symptoms not well controlled with use of first-line measures of intranasal steroid spray and oral antihistamine Will refer to ENT for further evaluation and treatment recommendations      Relevant Orders   Ambulatory  referral to ENT   Other Visit Diagnoses     Wellness examination       Relevant Orders   CBC with Differential/Platelet   Comprehensive metabolic panel   Hemoglobin A1c   Lipid panel       Outpatient Encounter Medications as of 03/17/2021  Medication Sig   clobetasol cream (TEMOVATE) 6.96 % Apply 1 application topically 2 (two) times daily.   diclofenac (VOLTAREN) 75 MG EC tablet Take 1 tablet (75 mg total) by mouth 2 (two) times daily.   fexofenadine (ALLEGRA) 180 MG tablet Take 180 mg by mouth every morning.    fluticasone (FLONASE) 50 MCG/ACT nasal spray Place 1 spray into both nostrils 2 (two) times daily.   ketoconazole (NIZORAL) 2 % cream Apply 1 application topically daily as needed (  for psoriasis).   Multiple Vitamin (MULTIVITAMIN WITH MINERALS) TABS tablet Take 1 tablet by mouth daily. Men's One-A-Day   nitroGLYCERIN (NITRODUR - DOSED IN MG/24 HR) 0.2 mg/hr patch Apply 1/4th patch to right knee, change daily   pantoprazole (PROTONIX) 40 MG tablet Take 1 tablet (40 mg total) by mouth daily.   No facility-administered encounter medications on file as of 03/17/2021.    Follow-up: Return in about 4 months (around 07/18/2021) for Follow Up.  Plan for follow-up in about 3 to 4 months for CPE with nurse visit 1 week prior for labs.  Labs to be completed as outlined above  Harrold Fitchett J [place], MD

## 2021-03-18 DIAGNOSIS — M25562 Pain in left knee: Secondary | ICD-10-CM | POA: Diagnosis not present

## 2021-03-18 DIAGNOSIS — M62551 Muscle wasting and atrophy, not elsewhere classified, right thigh: Secondary | ICD-10-CM | POA: Diagnosis not present

## 2021-03-18 DIAGNOSIS — M25461 Effusion, right knee: Secondary | ICD-10-CM | POA: Diagnosis not present

## 2021-03-18 DIAGNOSIS — M62552 Muscle wasting and atrophy, not elsewhere classified, left thigh: Secondary | ICD-10-CM | POA: Diagnosis not present

## 2021-03-18 DIAGNOSIS — F411 Generalized anxiety disorder: Secondary | ICD-10-CM | POA: Diagnosis not present

## 2021-03-18 DIAGNOSIS — M25462 Effusion, left knee: Secondary | ICD-10-CM | POA: Diagnosis not present

## 2021-03-18 DIAGNOSIS — M25561 Pain in right knee: Secondary | ICD-10-CM | POA: Diagnosis not present

## 2021-03-19 ENCOUNTER — Other Ambulatory Visit (HOSPITAL_COMMUNITY): Payer: Self-pay

## 2021-03-25 DIAGNOSIS — M62552 Muscle wasting and atrophy, not elsewhere classified, left thigh: Secondary | ICD-10-CM | POA: Diagnosis not present

## 2021-03-25 DIAGNOSIS — M25462 Effusion, left knee: Secondary | ICD-10-CM | POA: Diagnosis not present

## 2021-03-25 DIAGNOSIS — M62551 Muscle wasting and atrophy, not elsewhere classified, right thigh: Secondary | ICD-10-CM | POA: Diagnosis not present

## 2021-03-25 DIAGNOSIS — M25562 Pain in left knee: Secondary | ICD-10-CM | POA: Diagnosis not present

## 2021-03-25 DIAGNOSIS — M25561 Pain in right knee: Secondary | ICD-10-CM | POA: Diagnosis not present

## 2021-03-25 DIAGNOSIS — M25461 Effusion, right knee: Secondary | ICD-10-CM | POA: Diagnosis not present

## 2021-03-27 DIAGNOSIS — M25561 Pain in right knee: Secondary | ICD-10-CM | POA: Diagnosis not present

## 2021-03-27 DIAGNOSIS — M25461 Effusion, right knee: Secondary | ICD-10-CM | POA: Diagnosis not present

## 2021-03-27 DIAGNOSIS — M25462 Effusion, left knee: Secondary | ICD-10-CM | POA: Diagnosis not present

## 2021-03-27 DIAGNOSIS — M62551 Muscle wasting and atrophy, not elsewhere classified, right thigh: Secondary | ICD-10-CM | POA: Diagnosis not present

## 2021-03-27 DIAGNOSIS — M62552 Muscle wasting and atrophy, not elsewhere classified, left thigh: Secondary | ICD-10-CM | POA: Diagnosis not present

## 2021-03-27 DIAGNOSIS — M25562 Pain in left knee: Secondary | ICD-10-CM | POA: Diagnosis not present

## 2021-03-30 DIAGNOSIS — M62551 Muscle wasting and atrophy, not elsewhere classified, right thigh: Secondary | ICD-10-CM | POA: Diagnosis not present

## 2021-03-30 DIAGNOSIS — M25461 Effusion, right knee: Secondary | ICD-10-CM | POA: Diagnosis not present

## 2021-03-30 DIAGNOSIS — M25462 Effusion, left knee: Secondary | ICD-10-CM | POA: Diagnosis not present

## 2021-03-30 DIAGNOSIS — M62552 Muscle wasting and atrophy, not elsewhere classified, left thigh: Secondary | ICD-10-CM | POA: Diagnosis not present

## 2021-03-30 DIAGNOSIS — M25561 Pain in right knee: Secondary | ICD-10-CM | POA: Diagnosis not present

## 2021-03-30 DIAGNOSIS — M25562 Pain in left knee: Secondary | ICD-10-CM | POA: Diagnosis not present

## 2021-03-31 ENCOUNTER — Ambulatory Visit: Payer: 59 | Admitting: Physician Assistant

## 2021-04-02 DIAGNOSIS — M25461 Effusion, right knee: Secondary | ICD-10-CM | POA: Diagnosis not present

## 2021-04-02 DIAGNOSIS — F411 Generalized anxiety disorder: Secondary | ICD-10-CM | POA: Diagnosis not present

## 2021-04-02 DIAGNOSIS — M25561 Pain in right knee: Secondary | ICD-10-CM | POA: Diagnosis not present

## 2021-04-02 DIAGNOSIS — M62552 Muscle wasting and atrophy, not elsewhere classified, left thigh: Secondary | ICD-10-CM | POA: Diagnosis not present

## 2021-04-02 DIAGNOSIS — M25562 Pain in left knee: Secondary | ICD-10-CM | POA: Diagnosis not present

## 2021-04-02 DIAGNOSIS — M62551 Muscle wasting and atrophy, not elsewhere classified, right thigh: Secondary | ICD-10-CM | POA: Diagnosis not present

## 2021-04-02 DIAGNOSIS — M25462 Effusion, left knee: Secondary | ICD-10-CM | POA: Diagnosis not present

## 2021-04-08 DIAGNOSIS — M62552 Muscle wasting and atrophy, not elsewhere classified, left thigh: Secondary | ICD-10-CM | POA: Diagnosis not present

## 2021-04-08 DIAGNOSIS — M25461 Effusion, right knee: Secondary | ICD-10-CM | POA: Diagnosis not present

## 2021-04-08 DIAGNOSIS — M25462 Effusion, left knee: Secondary | ICD-10-CM | POA: Diagnosis not present

## 2021-04-08 DIAGNOSIS — M25562 Pain in left knee: Secondary | ICD-10-CM | POA: Diagnosis not present

## 2021-04-08 DIAGNOSIS — M62551 Muscle wasting and atrophy, not elsewhere classified, right thigh: Secondary | ICD-10-CM | POA: Diagnosis not present

## 2021-04-08 DIAGNOSIS — M25561 Pain in right knee: Secondary | ICD-10-CM | POA: Diagnosis not present

## 2021-04-09 DIAGNOSIS — F411 Generalized anxiety disorder: Secondary | ICD-10-CM | POA: Diagnosis not present

## 2021-04-14 DIAGNOSIS — F411 Generalized anxiety disorder: Secondary | ICD-10-CM | POA: Diagnosis not present

## 2021-04-15 ENCOUNTER — Ambulatory Visit: Payer: 59 | Admitting: Family Medicine

## 2021-04-15 ENCOUNTER — Encounter: Payer: Self-pay | Admitting: Family Medicine

## 2021-04-15 VITALS — BP 118/86 | Ht 77.0 in | Wt 240.0 lb

## 2021-04-15 DIAGNOSIS — M25561 Pain in right knee: Secondary | ICD-10-CM | POA: Diagnosis not present

## 2021-04-15 DIAGNOSIS — G8929 Other chronic pain: Secondary | ICD-10-CM | POA: Diagnosis not present

## 2021-04-15 DIAGNOSIS — M659 Synovitis and tenosynovitis, unspecified: Secondary | ICD-10-CM

## 2021-04-15 DIAGNOSIS — M25562 Pain in left knee: Secondary | ICD-10-CM

## 2021-04-15 NOTE — Patient Instructions (Signed)
It was great to see you again Will! Continue physical therapy and wean to home exercises when you're comfortable doing so. Try to do the home exercises/stretches at least every other day. Continue nitro patches (usually done for 3 or 6 months but can be used longer if you feel they're still helping you). If you need a refill on these just let me know. Message me through [facility] if you need anything or have questions otherwise follow up as needed.

## 2021-04-15 NOTE — Progress Notes (Signed)
PCP: [place], Blondell Reveal, MD  Subjective:   HPI: Patient is a 35 y.o. male here for bilateral knee, left wrist pain.  9/26: Knee Pain Patient states that overall his knee pain on the left as well as the right has improved.  He states that he has been taking the Voltaren orally for about 2 months states that he does notice a difference in his discomfort level when he does not take this medication.  Patient reports that his knees feel more achy when he does not take the medication.  He reports that range of motion has improved since he initially had the knee pain.  Patient would like to return to exercise as he reports feeling tightness in his IT band on the left.  He states that he has not had any swelling since his joint aspiration and injection.  Left Wrist Pain  Patient reports that he has been having left ulnar wrist pain since April.  He reports that he was playing with his toddler, pushed up with his left hand to get up when he first noticed the pain and is not aware of any of their specific injury prior to this.  He reports that pain is localized to the ulnar wrist on the volar side.  He reports tenderness with palpation of a specific spot.  He denies any decreased range of motion.  He denies any electrical shocklike pain or radiation of his symptoms.  Patient states that pain worsened most recently after playing golf for 2 days in a row.  He denies any weakness in his left hand and has no left arm pain associated with the pain in his wrist.  He states that taking the oral Voltaren does help to calm down any pain in his left wrist.  He has not noticed any redness or swelling.  11/9: Patient reports he's doing better. Doing physical therapy and home exercises, both going well. Using nitro patches still right knee. No new complaints.  Past Medical History:  Diagnosis Date   Allergy    Anxiety    Blood transfusion without reported diagnosis    Chronic allergic rhinitis    GERD  (gastroesophageal reflux disease) 10/03/2014   NASH (nonalcoholic steatohepatitis)    Premature labor, onset of delivery before 37 weeks of gestation, fetus 5 07/09/2015   Psoriasis 09/2814    Current Outpatient Medications on File Prior to Visit  Medication Sig Dispense Refill   clobetasol cream (TEMOVATE) 5.78 % Apply 1 application topically 2 (two) times daily.     diclofenac (VOLTAREN) 75 MG EC tablet Take 1 tablet (75 mg total) by mouth 2 (two) times daily. 60 tablet 1   fexofenadine (ALLEGRA) 180 MG tablet Take 180 mg by mouth every morning.      fluticasone (FLONASE) 50 MCG/ACT nasal spray Place 1 spray into both nostrils 2 (two) times daily.     ketoconazole (NIZORAL) 2 % cream Apply 1 application topically daily as needed (for psoriasis).     Multiple Vitamin (MULTIVITAMIN WITH MINERALS) TABS tablet Take 1 tablet by mouth daily. Men's One-A-Day     nitroGLYCERIN (NITRODUR - DOSED IN MG/24 HR) 0.2 mg/hr patch Apply 1/4th patch to right knee, change daily 30 patch 1   pantoprazole (PROTONIX) 40 MG tablet Take 1 tablet (40 mg total) by mouth daily. 90 tablet 1   No current facility-administered medications on file prior to visit.    Past Surgical History:  Procedure Laterality Date   CHOLECYSTECTOMY N/A 07/23/2016  Procedure: LAPAROSCOPIC CHOLECYSTECTOMY WITH INTRAOPERATIVE CHOLANGIOGRAM;  Surgeon: Leonie Green, MD;  Location: [facility] ORS;  Service: General;  Laterality: N/A;   ESOPHAGOGASTRODUODENOSCOPY (EGD) WITH PROPOFOL N/A 08/01/2015   Procedure: ESOPHAGOGASTRODUODENOSCOPY (EGD) WITH PROPOFOL;  Surgeon: Lollie Sails, MD;  Location: [facility] ENDOSCOPY;  Service: Endoscopy;  Laterality: N/A;   WISDOM TOOTH EXTRACTION      Allergies  Allergen Reactions   Augmentin [Amoxicillin-Pot Clavulanate]     Naausea, diarrhea stomach cramping.   Z-Pak [Azithromycin] Nausea And Vomiting    BP 118/86   Ht 6\' 5"  (1.956 m)   Wt 240 lb (108.9 kg)   BMI 28.46 kg/m   [facility] 12/29/2020 01/28/2021 03/04/2021 04/15/2021  Frequency of aerobic exercise (# of days/week) 0 3 3 3   Average time in minutes 0 30 30 30   Frequency of strengthening activities (# of days/week) 5 1 1 1     No flowsheet data found.      Objective:  Physical Exam:  Gen: NAD, comfortable in exam room  Right knee: No gross deformity, ecchymoses, swelling. No TTP . FROM with normal strength. Negative ant/post drawers. Negative valgus/varus testing. Negative lachman.  Negative mcmurrays, apleys.  NV intact distally.  Left knee: No gross deformity, ecchymoses, swelling. No TTP . FROM with normal strength. Negative ant/post drawers. Negative valgus/varus testing. Negative lachman.  Negative mcmurrays, apleys.  NV intact distally.  Left wrist: No deformity. FROM with 5/5 strength. No tenderness to palpation.   Assessment & Plan:  1. Bilateral knee pain - much improved with physical therapy, home exercise program.  He would like to continue with these as still getting benefit so will go ahead.  Nitro patches right knee for patellar tendinopathy.  2. Left wrist pain - 2/2 flexor tendinopathy.  Also improved.  Exam normal today.  F/u prn.

## 2021-04-16 DIAGNOSIS — M62552 Muscle wasting and atrophy, not elsewhere classified, left thigh: Secondary | ICD-10-CM | POA: Diagnosis not present

## 2021-04-16 DIAGNOSIS — M25562 Pain in left knee: Secondary | ICD-10-CM | POA: Diagnosis not present

## 2021-04-16 DIAGNOSIS — M62551 Muscle wasting and atrophy, not elsewhere classified, right thigh: Secondary | ICD-10-CM | POA: Diagnosis not present

## 2021-04-16 DIAGNOSIS — M25561 Pain in right knee: Secondary | ICD-10-CM | POA: Diagnosis not present

## 2021-04-16 DIAGNOSIS — M25462 Effusion, left knee: Secondary | ICD-10-CM | POA: Diagnosis not present

## 2021-04-16 DIAGNOSIS — M25461 Effusion, right knee: Secondary | ICD-10-CM | POA: Diagnosis not present

## 2021-04-22 DIAGNOSIS — M62551 Muscle wasting and atrophy, not elsewhere classified, right thigh: Secondary | ICD-10-CM | POA: Diagnosis not present

## 2021-04-22 DIAGNOSIS — M25461 Effusion, right knee: Secondary | ICD-10-CM | POA: Diagnosis not present

## 2021-04-22 DIAGNOSIS — M25562 Pain in left knee: Secondary | ICD-10-CM | POA: Diagnosis not present

## 2021-04-22 DIAGNOSIS — M25462 Effusion, left knee: Secondary | ICD-10-CM | POA: Diagnosis not present

## 2021-04-22 DIAGNOSIS — M25561 Pain in right knee: Secondary | ICD-10-CM | POA: Diagnosis not present

## 2021-04-22 DIAGNOSIS — M62552 Muscle wasting and atrophy, not elsewhere classified, left thigh: Secondary | ICD-10-CM | POA: Diagnosis not present

## 2021-04-22 DIAGNOSIS — F411 Generalized anxiety disorder: Secondary | ICD-10-CM | POA: Diagnosis not present

## 2021-04-27 DIAGNOSIS — M62552 Muscle wasting and atrophy, not elsewhere classified, left thigh: Secondary | ICD-10-CM | POA: Diagnosis not present

## 2021-04-27 DIAGNOSIS — M25462 Effusion, left knee: Secondary | ICD-10-CM | POA: Diagnosis not present

## 2021-04-27 DIAGNOSIS — M62551 Muscle wasting and atrophy, not elsewhere classified, right thigh: Secondary | ICD-10-CM | POA: Diagnosis not present

## 2021-04-27 DIAGNOSIS — M25461 Effusion, right knee: Secondary | ICD-10-CM | POA: Diagnosis not present

## 2021-04-27 DIAGNOSIS — M25561 Pain in right knee: Secondary | ICD-10-CM | POA: Diagnosis not present

## 2021-04-27 DIAGNOSIS — M25562 Pain in left knee: Secondary | ICD-10-CM | POA: Diagnosis not present

## 2021-05-01 DIAGNOSIS — J019 Acute sinusitis, unspecified: Secondary | ICD-10-CM | POA: Insufficient documentation

## 2021-05-05 ENCOUNTER — Other Ambulatory Visit (HOSPITAL_COMMUNITY): Payer: Self-pay

## 2021-05-05 DIAGNOSIS — L400 Psoriasis vulgaris: Secondary | ICD-10-CM | POA: Diagnosis not present

## 2021-05-05 MED ORDER — CLOBETASOL PROPIONATE 0.05 % EX SOLN
Freq: Two times a day (BID) | CUTANEOUS | 3 refills | Status: DC | PRN
  Filled 2021-05-05: qty 50, 30d supply, fill #0
  Filled 2021-08-20: qty 50, 30d supply, fill #1
  Filled 2021-11-18: qty 50, 30d supply, fill #2
  Filled 2022-01-16: qty 50, 30d supply, fill #3

## 2021-05-06 DIAGNOSIS — F411 Generalized anxiety disorder: Secondary | ICD-10-CM | POA: Diagnosis not present

## 2021-05-06 DIAGNOSIS — M25461 Effusion, right knee: Secondary | ICD-10-CM | POA: Diagnosis not present

## 2021-05-06 DIAGNOSIS — M62551 Muscle wasting and atrophy, not elsewhere classified, right thigh: Secondary | ICD-10-CM | POA: Diagnosis not present

## 2021-05-06 DIAGNOSIS — M25462 Effusion, left knee: Secondary | ICD-10-CM | POA: Diagnosis not present

## 2021-05-06 DIAGNOSIS — M25562 Pain in left knee: Secondary | ICD-10-CM | POA: Diagnosis not present

## 2021-05-06 DIAGNOSIS — M25561 Pain in right knee: Secondary | ICD-10-CM | POA: Diagnosis not present

## 2021-05-06 DIAGNOSIS — M62552 Muscle wasting and atrophy, not elsewhere classified, left thigh: Secondary | ICD-10-CM | POA: Diagnosis not present

## 2021-05-13 DIAGNOSIS — M62551 Muscle wasting and atrophy, not elsewhere classified, right thigh: Secondary | ICD-10-CM | POA: Diagnosis not present

## 2021-05-13 DIAGNOSIS — M25562 Pain in left knee: Secondary | ICD-10-CM | POA: Diagnosis not present

## 2021-05-13 DIAGNOSIS — M25561 Pain in right knee: Secondary | ICD-10-CM | POA: Diagnosis not present

## 2021-05-13 DIAGNOSIS — M25461 Effusion, right knee: Secondary | ICD-10-CM | POA: Diagnosis not present

## 2021-05-13 DIAGNOSIS — M25462 Effusion, left knee: Secondary | ICD-10-CM | POA: Diagnosis not present

## 2021-05-13 DIAGNOSIS — M62552 Muscle wasting and atrophy, not elsewhere classified, left thigh: Secondary | ICD-10-CM | POA: Diagnosis not present

## 2021-05-14 DIAGNOSIS — F411 Generalized anxiety disorder: Secondary | ICD-10-CM | POA: Diagnosis not present

## 2021-05-27 DIAGNOSIS — M25462 Effusion, left knee: Secondary | ICD-10-CM | POA: Diagnosis not present

## 2021-05-27 DIAGNOSIS — M62551 Muscle wasting and atrophy, not elsewhere classified, right thigh: Secondary | ICD-10-CM | POA: Diagnosis not present

## 2021-05-27 DIAGNOSIS — M62552 Muscle wasting and atrophy, not elsewhere classified, left thigh: Secondary | ICD-10-CM | POA: Diagnosis not present

## 2021-05-27 DIAGNOSIS — M25461 Effusion, right knee: Secondary | ICD-10-CM | POA: Diagnosis not present

## 2021-05-27 DIAGNOSIS — M25561 Pain in right knee: Secondary | ICD-10-CM | POA: Diagnosis not present

## 2021-05-27 DIAGNOSIS — M25562 Pain in left knee: Secondary | ICD-10-CM | POA: Diagnosis not present

## 2021-05-28 ENCOUNTER — Other Ambulatory Visit (HOSPITAL_COMMUNITY): Payer: Self-pay

## 2021-05-28 DIAGNOSIS — H00024 Hordeolum internum left upper eyelid: Secondary | ICD-10-CM | POA: Diagnosis not present

## 2021-05-28 MED ORDER — NEOMYCIN-POLYMYXIN-DEXAMETH 3.5-10000-0.1 OP SUSP
1.0000 [drp] | Freq: Four times a day (QID) | OPHTHALMIC | 0 refills | Status: DC
  Filled 2021-05-28: qty 5, 7d supply, fill #0

## 2021-06-03 DIAGNOSIS — F411 Generalized anxiety disorder: Secondary | ICD-10-CM | POA: Diagnosis not present

## 2021-06-04 DIAGNOSIS — M62552 Muscle wasting and atrophy, not elsewhere classified, left thigh: Secondary | ICD-10-CM | POA: Diagnosis not present

## 2021-06-04 DIAGNOSIS — M25562 Pain in left knee: Secondary | ICD-10-CM | POA: Diagnosis not present

## 2021-06-04 DIAGNOSIS — M25462 Effusion, left knee: Secondary | ICD-10-CM | POA: Diagnosis not present

## 2021-06-04 DIAGNOSIS — M25461 Effusion, right knee: Secondary | ICD-10-CM | POA: Diagnosis not present

## 2021-06-04 DIAGNOSIS — M25561 Pain in right knee: Secondary | ICD-10-CM | POA: Diagnosis not present

## 2021-06-04 DIAGNOSIS — M62551 Muscle wasting and atrophy, not elsewhere classified, right thigh: Secondary | ICD-10-CM | POA: Diagnosis not present

## 2021-06-05 ENCOUNTER — Other Ambulatory Visit (HOSPITAL_BASED_OUTPATIENT_CLINIC_OR_DEPARTMENT_OTHER): Payer: Self-pay | Admitting: Family Medicine

## 2021-06-05 ENCOUNTER — Other Ambulatory Visit (HOSPITAL_COMMUNITY): Payer: Self-pay

## 2021-06-05 DIAGNOSIS — K219 Gastro-esophageal reflux disease without esophagitis: Secondary | ICD-10-CM

## 2021-06-05 MED ORDER — PANTOPRAZOLE SODIUM 40 MG PO TBEC
40.0000 mg | DELAYED_RELEASE_TABLET | Freq: Every day | ORAL | 1 refills | Status: DC
  Filled 2021-06-05: qty 90, 90d supply, fill #0
  Filled 2021-08-23: qty 90, 90d supply, fill #1

## 2021-06-09 ENCOUNTER — Other Ambulatory Visit (HOSPITAL_COMMUNITY): Payer: Self-pay

## 2021-06-09 ENCOUNTER — Telehealth: Payer: 59 | Admitting: Physician Assistant

## 2021-06-09 DIAGNOSIS — J019 Acute sinusitis, unspecified: Secondary | ICD-10-CM

## 2021-06-09 MED ORDER — DOXYCYCLINE HYCLATE 100 MG PO CAPS
100.0000 mg | ORAL_CAPSULE | Freq: Two times a day (BID) | ORAL | 0 refills | Status: AC
  Filled 2021-06-09: qty 14, 7d supply, fill #0

## 2021-06-09 NOTE — Progress Notes (Signed)

## 2021-06-10 DIAGNOSIS — F411 Generalized anxiety disorder: Secondary | ICD-10-CM | POA: Diagnosis not present

## 2021-06-11 DIAGNOSIS — M62552 Muscle wasting and atrophy, not elsewhere classified, left thigh: Secondary | ICD-10-CM | POA: Diagnosis not present

## 2021-06-11 DIAGNOSIS — M25461 Effusion, right knee: Secondary | ICD-10-CM | POA: Diagnosis not present

## 2021-06-11 DIAGNOSIS — M25561 Pain in right knee: Secondary | ICD-10-CM | POA: Diagnosis not present

## 2021-06-11 DIAGNOSIS — M62551 Muscle wasting and atrophy, not elsewhere classified, right thigh: Secondary | ICD-10-CM | POA: Diagnosis not present

## 2021-06-11 DIAGNOSIS — M25462 Effusion, left knee: Secondary | ICD-10-CM | POA: Diagnosis not present

## 2021-06-11 DIAGNOSIS — M25562 Pain in left knee: Secondary | ICD-10-CM | POA: Diagnosis not present

## 2021-06-17 DIAGNOSIS — M62552 Muscle wasting and atrophy, not elsewhere classified, left thigh: Secondary | ICD-10-CM | POA: Diagnosis not present

## 2021-06-17 DIAGNOSIS — M25561 Pain in right knee: Secondary | ICD-10-CM | POA: Diagnosis not present

## 2021-06-17 DIAGNOSIS — M25461 Effusion, right knee: Secondary | ICD-10-CM | POA: Diagnosis not present

## 2021-06-17 DIAGNOSIS — F411 Generalized anxiety disorder: Secondary | ICD-10-CM | POA: Diagnosis not present

## 2021-06-17 DIAGNOSIS — M25562 Pain in left knee: Secondary | ICD-10-CM | POA: Diagnosis not present

## 2021-06-17 DIAGNOSIS — M62551 Muscle wasting and atrophy, not elsewhere classified, right thigh: Secondary | ICD-10-CM | POA: Diagnosis not present

## 2021-06-17 DIAGNOSIS — M25462 Effusion, left knee: Secondary | ICD-10-CM | POA: Diagnosis not present

## 2021-06-23 DIAGNOSIS — F411 Generalized anxiety disorder: Secondary | ICD-10-CM | POA: Diagnosis not present

## 2021-06-24 DIAGNOSIS — M62552 Muscle wasting and atrophy, not elsewhere classified, left thigh: Secondary | ICD-10-CM | POA: Diagnosis not present

## 2021-06-24 DIAGNOSIS — M62551 Muscle wasting and atrophy, not elsewhere classified, right thigh: Secondary | ICD-10-CM | POA: Diagnosis not present

## 2021-06-24 DIAGNOSIS — M25562 Pain in left knee: Secondary | ICD-10-CM | POA: Diagnosis not present

## 2021-06-24 DIAGNOSIS — M25561 Pain in right knee: Secondary | ICD-10-CM | POA: Diagnosis not present

## 2021-06-24 DIAGNOSIS — M25462 Effusion, left knee: Secondary | ICD-10-CM | POA: Diagnosis not present

## 2021-06-24 DIAGNOSIS — M25461 Effusion, right knee: Secondary | ICD-10-CM | POA: Diagnosis not present

## 2021-06-29 ENCOUNTER — Other Ambulatory Visit (HOSPITAL_COMMUNITY): Payer: Self-pay

## 2021-06-29 DIAGNOSIS — H0015 Chalazion left lower eyelid: Secondary | ICD-10-CM | POA: Diagnosis not present

## 2021-06-29 MED ORDER — DOXYCYCLINE MONOHYDRATE 50 MG PO CAPS
50.0000 mg | ORAL_CAPSULE | Freq: Every day | ORAL | 0 refills | Status: DC
  Filled 2021-06-29: qty 30, 30d supply, fill #0

## 2021-06-29 MED ORDER — NEOMYCIN-POLYMYXIN-DEXAMETH 3.5-10000-0.1 OP OINT
TOPICAL_OINTMENT | OPHTHALMIC | 0 refills | Status: DC
  Filled 2021-06-29: qty 3.5, 10d supply, fill #0

## 2021-06-29 MED ORDER — NEOMYCIN-POLYMYXIN-DEXAMETH 3.5-10000-0.1 OP SUSP
1.0000 [drp] | Freq: Four times a day (QID) | OPHTHALMIC | 0 refills | Status: DC
  Filled 2021-06-29: qty 5, 13d supply, fill #0

## 2021-07-01 DIAGNOSIS — M25461 Effusion, right knee: Secondary | ICD-10-CM | POA: Diagnosis not present

## 2021-07-01 DIAGNOSIS — M62551 Muscle wasting and atrophy, not elsewhere classified, right thigh: Secondary | ICD-10-CM | POA: Diagnosis not present

## 2021-07-01 DIAGNOSIS — M25561 Pain in right knee: Secondary | ICD-10-CM | POA: Diagnosis not present

## 2021-07-01 DIAGNOSIS — M25562 Pain in left knee: Secondary | ICD-10-CM | POA: Diagnosis not present

## 2021-07-01 DIAGNOSIS — M62552 Muscle wasting and atrophy, not elsewhere classified, left thigh: Secondary | ICD-10-CM | POA: Diagnosis not present

## 2021-07-01 DIAGNOSIS — M25462 Effusion, left knee: Secondary | ICD-10-CM | POA: Diagnosis not present

## 2021-07-08 DIAGNOSIS — M62552 Muscle wasting and atrophy, not elsewhere classified, left thigh: Secondary | ICD-10-CM | POA: Diagnosis not present

## 2021-07-08 DIAGNOSIS — M62551 Muscle wasting and atrophy, not elsewhere classified, right thigh: Secondary | ICD-10-CM | POA: Diagnosis not present

## 2021-07-08 DIAGNOSIS — M25461 Effusion, right knee: Secondary | ICD-10-CM | POA: Diagnosis not present

## 2021-07-08 DIAGNOSIS — M25561 Pain in right knee: Secondary | ICD-10-CM | POA: Diagnosis not present

## 2021-07-08 DIAGNOSIS — M25562 Pain in left knee: Secondary | ICD-10-CM | POA: Diagnosis not present

## 2021-07-08 DIAGNOSIS — M25462 Effusion, left knee: Secondary | ICD-10-CM | POA: Diagnosis not present

## 2021-07-09 DIAGNOSIS — F411 Generalized anxiety disorder: Secondary | ICD-10-CM | POA: Diagnosis not present

## 2021-07-15 ENCOUNTER — Ambulatory Visit (HOSPITAL_BASED_OUTPATIENT_CLINIC_OR_DEPARTMENT_OTHER): Payer: 59

## 2021-07-15 ENCOUNTER — Other Ambulatory Visit: Payer: Self-pay

## 2021-07-15 DIAGNOSIS — M62551 Muscle wasting and atrophy, not elsewhere classified, right thigh: Secondary | ICD-10-CM | POA: Diagnosis not present

## 2021-07-15 DIAGNOSIS — M25562 Pain in left knee: Secondary | ICD-10-CM | POA: Diagnosis not present

## 2021-07-15 DIAGNOSIS — Z0000 Encounter for general adult medical examination without abnormal findings: Secondary | ICD-10-CM

## 2021-07-15 DIAGNOSIS — M25561 Pain in right knee: Secondary | ICD-10-CM | POA: Diagnosis not present

## 2021-07-15 DIAGNOSIS — M25461 Effusion, right knee: Secondary | ICD-10-CM | POA: Diagnosis not present

## 2021-07-15 DIAGNOSIS — M25462 Effusion, left knee: Secondary | ICD-10-CM | POA: Diagnosis not present

## 2021-07-15 DIAGNOSIS — M62552 Muscle wasting and atrophy, not elsewhere classified, left thigh: Secondary | ICD-10-CM | POA: Diagnosis not present

## 2021-07-16 LAB — CBC WITH DIFFERENTIAL/PLATELET
Basophils Absolute: 0 10*3/uL (ref 0.0–0.2)
Basos: 1 %
EOS (ABSOLUTE): 0.2 10*3/uL (ref 0.0–0.4)
Eos: 3 %
Hematocrit: 48.2 % (ref 37.5–51.0)
Hemoglobin: 16.2 g/dL (ref 13.0–17.7)
Immature Grans (Abs): 0 10*3/uL (ref 0.0–0.1)
Immature Granulocytes: 0 %
Lymphocytes Absolute: 2 10*3/uL (ref 0.7–3.1)
Lymphs: 35 %
MCH: 31.3 pg (ref 26.6–33.0)
MCHC: 33.6 g/dL (ref 31.5–35.7)
MCV: 93 fL (ref 79–97)
Monocytes Absolute: 0.5 10*3/uL (ref 0.1–0.9)
Monocytes: 8 %
Neutrophils Absolute: 3.1 10*3/uL (ref 1.4–7.0)
Neutrophils: 53 %
Platelets: 265 10*3/uL (ref 150–450)
RBC: 5.18 x10E6/uL (ref 4.14–5.80)
RDW: 12.5 % (ref 11.6–15.4)
WBC: 5.7 10*3/uL (ref 3.4–10.8)

## 2021-07-16 LAB — LIPID PANEL
Chol/HDL Ratio: 3.8 ratio (ref 0.0–5.0)
Cholesterol, Total: 147 mg/dL (ref 100–199)
HDL: 39 mg/dL — ABNORMAL LOW (ref >39)
LDL Chol Calc (NIH): 89 mg/dL (ref 0–99)
Triglycerides: 100 mg/dL (ref 0–149)
VLDL Cholesterol Cal: 19 mg/dL (ref 5–40)

## 2021-07-16 LAB — HEMOGLOBIN A1C
Est. average glucose Bld gHb Est-mCnc: 100 mg/dL
Hgb A1c MFr Bld: 5.1 % (ref 4.8–5.6)

## 2021-07-16 LAB — COMPREHENSIVE METABOLIC PANEL
ALT: 44 IU/L (ref 0–44)
AST: 28 IU/L (ref 0–40)
Albumin/Globulin Ratio: 1.7 (ref 1.2–2.2)
Albumin: 4.8 g/dL (ref 4.0–5.0)
Alkaline Phosphatase: 70 IU/L (ref 44–121)
BUN/Creatinine Ratio: 15 (ref 9–20)
BUN: 14 mg/dL (ref 6–20)
Bilirubin Total: 0.9 mg/dL (ref 0.0–1.2)
CO2: 22 mmol/L (ref 20–29)
Calcium: 9.8 mg/dL (ref 8.7–10.2)
Chloride: 103 mmol/L (ref 96–106)
Creatinine, Ser: 0.92 mg/dL (ref 0.76–1.27)
Globulin, Total: 2.9 g/dL (ref 1.5–4.5)
Glucose: 103 mg/dL — ABNORMAL HIGH (ref 70–99)
Potassium: 4.5 mmol/L (ref 3.5–5.2)
Sodium: 141 mmol/L (ref 134–144)
Total Protein: 7.7 g/dL (ref 6.0–8.5)
eGFR: 111 mL/min/{1.73_m2} (ref >59)

## 2021-07-17 ENCOUNTER — Telehealth (HOSPITAL_BASED_OUTPATIENT_CLINIC_OR_DEPARTMENT_OTHER): Payer: Self-pay

## 2021-07-17 NOTE — Telephone Encounter (Signed)
Per DPR left detailed voicemail of lab results sand recommendations °Instructed patient that results are available for his review via mychart °Instructed the patient to call the office with any questions or concerns °

## 2021-07-17 NOTE — Telephone Encounter (Signed)
-----   Message from Raymond J de [place], MD sent at 07/16/2021 11:48 AM EST ----- White blood cell and red blood cell counts are normal with normal hemoglobin.  Electrolytes, kidney function and liver function are all normal.  Cholesterol panel is normal.  Hemoglobin A1c is within normal range.

## 2021-07-21 ENCOUNTER — Ambulatory Visit (INDEPENDENT_AMBULATORY_CARE_PROVIDER_SITE_OTHER): Payer: 59 | Admitting: Family Medicine

## 2021-07-21 ENCOUNTER — Encounter (HOSPITAL_BASED_OUTPATIENT_CLINIC_OR_DEPARTMENT_OTHER): Payer: Self-pay | Admitting: Family Medicine

## 2021-07-21 ENCOUNTER — Other Ambulatory Visit: Payer: Self-pay

## 2021-07-21 DIAGNOSIS — Z0000 Encounter for general adult medical examination without abnormal findings: Secondary | ICD-10-CM | POA: Diagnosis not present

## 2021-07-21 NOTE — Assessment & Plan Note (Addendum)
Routine HCM labs reviewed. HCM reviewed/discussed. Anticipatory guidance regarding healthy weight, lifestyle and choices given. Recommend healthy diet.  Recommend approximately 150 minutes/week of moderate intensity exercise Recommend regular dental and vision exams Always use seatbelt/lap and shoulder restraints Recommend using smoke alarms and checking batteries at least twice a year Recommend using sunscreen when outside Provided information for ENT office where patient was referred to previously for patient to reach out about scheduling an appointment

## 2021-07-21 NOTE — Progress Notes (Signed)
Subjective:    CC: Annual Physical Exam  HPI:  Luis Khan is a 36 y.o. presenting for annual physical  I reviewed the past medical history, family history, social history, surgical history, and allergies today and no changes were needed.  Please see the problem list section below in epic for further details.  Past Medical History: Past Medical History:  Diagnosis Date   Allergy    Anxiety    Blood transfusion without reported diagnosis    Chronic allergic rhinitis    GERD (gastroesophageal reflux disease) 10/03/2014   NASH (nonalcoholic steatohepatitis)    Premature labor, onset of delivery before 37 weeks of gestation, fetus 5 07/09/2015   Psoriasis 09/2814   Past Surgical History: Past Surgical History:  Procedure Laterality Date   CHOLECYSTECTOMY N/A 07/23/2016   Procedure: LAPAROSCOPIC CHOLECYSTECTOMY WITH INTRAOPERATIVE CHOLANGIOGRAM;  Surgeon: Leonie Green, MD;  Location: [facility] ORS;  Service: General;  Laterality: N/A;   ESOPHAGOGASTRODUODENOSCOPY (EGD) WITH PROPOFOL N/A 08/01/2015   Procedure: ESOPHAGOGASTRODUODENOSCOPY (EGD) WITH PROPOFOL;  Surgeon: Lollie Sails, MD;  Location: [facility] ENDOSCOPY;  Service: Endoscopy;  Laterality: N/A;   WISDOM TOOTH EXTRACTION     Social History: Social History   Socioeconomic History   Marital status: Married    Spouse name: Not on file   Number of children: Not on file   Years of education: Not on file   Highest education level: Not on file  Occupational History   Not on file  Tobacco Use   Smoking status: Never   Smokeless tobacco: Never  Vaping Use   Vaping Use: Never used  Substance and Sexual Activity   Alcohol use: Yes    Alcohol/week: 3.0 standard drinks    Types: 2 Glasses of wine, 1 Standard drinks or equivalent per week    Comment: occasionally/ special events   Drug use: No   Sexual activity: Yes    Birth control/protection: None    Comment: Wife has an IUD  Other Topics Concern   Not on file   Social History Narrative   Not on file   Social Determinants of Health   Financial Resource Strain: Not on file  Food Insecurity: Not on file  Transportation Needs: Not on file  Physical Activity: Not on file  Stress: Not on file  Social Connections: Not on file   Family History: Family History  Problem Relation Age of Onset   Irritable bowel syndrome Mother    Kidney Stones Mother    Hypertension Father    Gallstones Father    Hyperlipidemia Father    Congestive Heart Failure Paternal Grandfather    Coronary artery disease Paternal Grandfather    Heart attack Paternal Grandfather    Diabetes Paternal Grandfather    Gallbladder disease Maternal Aunt    Kidney cancer Maternal Aunt    Diabetes Mellitus II Maternal Uncle    Pancreatic cancer Maternal Grandmother    Pancreatic disease Maternal Grandmother    Cancer Maternal Grandmother    Dementia Maternal Grandfather    Heart attack Maternal Grandfather    Gallstones Paternal Grandmother    Drug abuse Paternal Grandmother    Hypertension Paternal Grandmother    Kidney Stones Paternal Grandmother    Diabetes Maternal Uncle    Allergies: Allergies  Allergen Reactions   Augmentin [Amoxicillin-Pot Clavulanate]     Naausea, diarrhea stomach cramping.   Z-Pak [Azithromycin] Nausea And Vomiting   Medications: See med rec.  Review of Systems: No headache, visual changes, nausea, vomiting,  diarrhea, constipation, dizziness, abdominal pain, skin rash, fevers, chills, night sweats, swollen lymph nodes, weight loss, chest pain, body aches, joint swelling, muscle aches, shortness of breath, mood changes, visual or auditory hallucinations.  Objective:    BP 128/88    Pulse 72    Ht 6\' 5"  (1.956 m)    Wt 232 lb (105.2 kg)    SpO2 99%    BMI 27.51 kg/m   General: Well Developed, well nourished, and in no acute distress.  Neuro: Alert and oriented x3, extra-ocular muscles intact, sensation grossly intact. Cranial nerves II  through XII are intact, motor, sensory, and coordinative functions are all intact. HEENT: Normocephalic, atraumatic, pupils equal round reactive to light, neck supple, no masses, no lymphadenopathy, thyroid nonpalpable. Oropharynx, nasopharynx, external ear canals are unremarkable. Skin: Warm and dry, no rashes noted.  Cardiac: Regular rate and rhythm, no murmurs rubs or gallops.  Respiratory: Clear to auscultation bilaterally. Not using accessory muscles, speaking in full sentences.  Abdominal: Soft, nontender, nondistended, positive bowel sounds, no masses, no organomegaly.  Musculoskeletal: Shoulder, elbow, wrist, hip, knee, ankle stable, and with full range of motion.  Impression and Recommendations:    Wellness examination Routine HCM labs reviewed. HCM reviewed/discussed. Anticipatory guidance regarding healthy weight, lifestyle and choices given. Recommend healthy diet.  Recommend approximately 150 minutes/week of moderate intensity exercise Recommend regular dental and vision exams Always use seatbelt/lap and shoulder restraints Recommend using smoke alarms and checking batteries at least twice a year Recommend using sunscreen when outside Provided information for ENT office where patient was referred to previously for patient to reach out about scheduling an appointment  Provided handout today regarding home exercises to try to help with low back pain  Plan for follow-up in about 1 year or sooner as needed.  Can complete labs about 1 week before 1 year follow-up, will check CBC, CMP, lipid panel   ___________________________________________ Gibson Telleria de [place], MD, ABFM, CAQSM [facility]

## 2021-07-22 DIAGNOSIS — M62552 Muscle wasting and atrophy, not elsewhere classified, left thigh: Secondary | ICD-10-CM | POA: Diagnosis not present

## 2021-07-22 DIAGNOSIS — M25461 Effusion, right knee: Secondary | ICD-10-CM | POA: Diagnosis not present

## 2021-07-22 DIAGNOSIS — M62551 Muscle wasting and atrophy, not elsewhere classified, right thigh: Secondary | ICD-10-CM | POA: Diagnosis not present

## 2021-07-22 DIAGNOSIS — M25462 Effusion, left knee: Secondary | ICD-10-CM | POA: Diagnosis not present

## 2021-07-22 DIAGNOSIS — M25562 Pain in left knee: Secondary | ICD-10-CM | POA: Diagnosis not present

## 2021-07-22 DIAGNOSIS — M25561 Pain in right knee: Secondary | ICD-10-CM | POA: Diagnosis not present

## 2021-07-23 DIAGNOSIS — F411 Generalized anxiety disorder: Secondary | ICD-10-CM | POA: Diagnosis not present

## 2021-07-29 DIAGNOSIS — M25561 Pain in right knee: Secondary | ICD-10-CM | POA: Diagnosis not present

## 2021-07-29 DIAGNOSIS — M62551 Muscle wasting and atrophy, not elsewhere classified, right thigh: Secondary | ICD-10-CM | POA: Diagnosis not present

## 2021-07-29 DIAGNOSIS — M25462 Effusion, left knee: Secondary | ICD-10-CM | POA: Diagnosis not present

## 2021-07-29 DIAGNOSIS — F411 Generalized anxiety disorder: Secondary | ICD-10-CM | POA: Diagnosis not present

## 2021-07-29 DIAGNOSIS — M62552 Muscle wasting and atrophy, not elsewhere classified, left thigh: Secondary | ICD-10-CM | POA: Diagnosis not present

## 2021-07-29 DIAGNOSIS — M25562 Pain in left knee: Secondary | ICD-10-CM | POA: Diagnosis not present

## 2021-07-29 DIAGNOSIS — M25461 Effusion, right knee: Secondary | ICD-10-CM | POA: Diagnosis not present

## 2021-08-04 ENCOUNTER — Encounter (HOSPITAL_BASED_OUTPATIENT_CLINIC_OR_DEPARTMENT_OTHER): Payer: Self-pay | Admitting: Family Medicine

## 2021-08-05 DIAGNOSIS — M25461 Effusion, right knee: Secondary | ICD-10-CM | POA: Diagnosis not present

## 2021-08-05 DIAGNOSIS — M25562 Pain in left knee: Secondary | ICD-10-CM | POA: Diagnosis not present

## 2021-08-05 DIAGNOSIS — M62551 Muscle wasting and atrophy, not elsewhere classified, right thigh: Secondary | ICD-10-CM | POA: Diagnosis not present

## 2021-08-05 DIAGNOSIS — M25561 Pain in right knee: Secondary | ICD-10-CM | POA: Diagnosis not present

## 2021-08-05 DIAGNOSIS — M62552 Muscle wasting and atrophy, not elsewhere classified, left thigh: Secondary | ICD-10-CM | POA: Diagnosis not present

## 2021-08-05 DIAGNOSIS — M25462 Effusion, left knee: Secondary | ICD-10-CM | POA: Diagnosis not present

## 2021-08-06 DIAGNOSIS — F411 Generalized anxiety disorder: Secondary | ICD-10-CM | POA: Diagnosis not present

## 2021-08-13 DIAGNOSIS — F411 Generalized anxiety disorder: Secondary | ICD-10-CM | POA: Diagnosis not present

## 2021-08-20 ENCOUNTER — Other Ambulatory Visit (HOSPITAL_COMMUNITY): Payer: Self-pay

## 2021-08-20 DIAGNOSIS — M25462 Effusion, left knee: Secondary | ICD-10-CM | POA: Diagnosis not present

## 2021-08-20 DIAGNOSIS — M62551 Muscle wasting and atrophy, not elsewhere classified, right thigh: Secondary | ICD-10-CM | POA: Diagnosis not present

## 2021-08-20 DIAGNOSIS — F411 Generalized anxiety disorder: Secondary | ICD-10-CM | POA: Diagnosis not present

## 2021-08-20 DIAGNOSIS — M62552 Muscle wasting and atrophy, not elsewhere classified, left thigh: Secondary | ICD-10-CM | POA: Diagnosis not present

## 2021-08-20 DIAGNOSIS — M25461 Effusion, right knee: Secondary | ICD-10-CM | POA: Diagnosis not present

## 2021-08-20 DIAGNOSIS — M25561 Pain in right knee: Secondary | ICD-10-CM | POA: Diagnosis not present

## 2021-08-20 DIAGNOSIS — M25562 Pain in left knee: Secondary | ICD-10-CM | POA: Diagnosis not present

## 2021-08-20 DIAGNOSIS — H00014 Hordeolum externum left upper eyelid: Secondary | ICD-10-CM | POA: Diagnosis not present

## 2021-08-20 MED ORDER — NEOMYCIN-POLYMYXIN-DEXAMETH 3.5-10000-0.1 OP OINT
TOPICAL_OINTMENT | OPHTHALMIC | 0 refills | Status: DC
  Filled 2021-08-21: qty 3.5, 10d supply, fill #0

## 2021-08-20 MED ORDER — DOXYCYCLINE MONOHYDRATE 50 MG PO CAPS
50.0000 mg | ORAL_CAPSULE | Freq: Every day | ORAL | 0 refills | Status: DC
  Filled 2021-08-20: qty 30, 30d supply, fill #0

## 2021-08-21 ENCOUNTER — Other Ambulatory Visit (HOSPITAL_COMMUNITY): Payer: Self-pay

## 2021-08-24 ENCOUNTER — Other Ambulatory Visit (HOSPITAL_COMMUNITY): Payer: Self-pay

## 2021-08-26 ENCOUNTER — Other Ambulatory Visit (HOSPITAL_COMMUNITY): Payer: Self-pay

## 2021-08-26 ENCOUNTER — Ambulatory Visit: Payer: 59 | Admitting: Family Medicine

## 2021-08-26 VITALS — BP 124/84 | Ht 77.0 in | Wt 230.0 lb

## 2021-08-26 DIAGNOSIS — M545 Low back pain, unspecified: Secondary | ICD-10-CM

## 2021-08-26 NOTE — Patient Instructions (Signed)
You have a lumbar strain. ?Ok to take tylenol for baseline pain relief (1-2 extra strength tabs 3x/day) ?Take aleve 2 tabs twice a day with food for pain and inflammation as needed ?Flexeril as needed for muscle spasms (no driving on this medicine if it makes you sleepy). ?Stay as active as possible. ?Do home exercises and stretches as directed - hold each for 20-30 seconds and do each one three times. ?Continue physical therapy - we will send a referral to add this to your treatment plan. ?Strengthening of low back muscles, abdominal musculature are key for long term pain relief. ?Follow up with me in 1 month or as needed if you're doing well. ? ? ?

## 2021-08-27 ENCOUNTER — Encounter: Payer: Self-pay | Admitting: Family Medicine

## 2021-08-27 NOTE — Progress Notes (Signed)
PCP: de [place], Raymond J, MD ? ?Subjective:  ? ?HPI: ?Patient is a 36 y.o. male here for left back pain. ? ?Patient reports he started to get pain in left lower back following squat lifts in physical therapy. ?No acute injury at the time but started to notice pain a couple days later. ?No radiation into legs. ?No numbness or tingling. ?No bowel/bladder dysfunction. ?Pain worse with twisting. ? ?Past Medical History:  ?Diagnosis Date  ? Allergy   ? Anxiety   ? Blood transfusion without reported diagnosis   ? Chronic allergic rhinitis   ? GERD (gastroesophageal reflux disease) 10/03/2014  ? NASH (nonalcoholic steatohepatitis)   ? Premature labor, onset of delivery before 37 weeks of gestation, fetus 5 07/09/2015  ? Psoriasis 09/2814  ? ? ?Current Outpatient Medications on File Prior to Visit  ?Medication Sig Dispense Refill  ? clobetasol (TEMOVATE) 0.05 % external solution Apply topically 2 (two) times daily to affected area as needed. 50 mL 3  ? clobetasol cream (TEMOVATE) 6.16 % Apply 1 application topically 2 (two) times daily.    ? doxycycline (MONODOX) 50 MG capsule Take 1 capsule (50 mg total) by mouth daily. 30 capsule 0  ? fexofenadine (ALLEGRA) 180 MG tablet Take 180 mg by mouth every morning.     ? fluticasone (FLONASE) 50 MCG/ACT nasal spray Place 1 spray into both nostrils 2 (two) times daily.    ? Multiple Vitamin (MULTIVITAMIN WITH MINERALS) TABS tablet Take 1 tablet by mouth daily. Men's One-A-Day    ? neomycin-polymyxin b-dexamethasone (MAXITROL) 3.5-10000-0.1 OINT Apply a small amount to external eyelid as directed three times daily for 10 days 3.5 g 0  ? neomycin-polymyxin b-dexamethasone (MAXITROL) 3.5-10000-0.1 SUSP Place 1 drop into the affected eye(s) 4 (four) times daily for 7 days. 5 mL 0  ? nitroGLYCERIN (NITRODUR - DOSED IN MG/24 HR) 0.2 mg/hr patch Apply 1/4th patch to right knee, change daily 30 patch 1  ? pantoprazole (PROTONIX) 40 MG tablet Take 1 tablet (40 mg total) by mouth daily. 90  tablet 1  ? ?No current facility-administered medications on file prior to visit.  ? ? ?Past Surgical History:  ?Procedure Laterality Date  ? CHOLECYSTECTOMY N/A 07/23/2016  ? Procedure: LAPAROSCOPIC CHOLECYSTECTOMY WITH INTRAOPERATIVE CHOLANGIOGRAM;  Surgeon: Leonie Green, MD;  Location: [facility] ORS;  Service: General;  Laterality: N/A;  ? ESOPHAGOGASTRODUODENOSCOPY (EGD) WITH PROPOFOL N/A 08/01/2015  ? Procedure: ESOPHAGOGASTRODUODENOSCOPY (EGD) WITH PROPOFOL;  Surgeon: Lollie Sails, MD;  Location: [facility] ENDOSCOPY;  Service: Endoscopy;  Laterality: N/A;  ? WISDOM TOOTH EXTRACTION    ? ? ?Allergies  ?Allergen Reactions  ? Augmentin [Amoxicillin-Pot Clavulanate]   ?  Naausea, diarrhea stomach cramping.  ? Z-Pak [Azithromycin] Nausea And Vomiting  ? ? ?BP 124/84   Ht '6\' 5"'$  (1.956 m)   Wt 230 lb (104.3 kg)   BMI 27.27 kg/m?  ? ? ?  12/29/2020  ?  1:37 PM 01/28/2021  ?  8:32 AM 03/04/2021  ?  8:40 AM 04/15/2021  ?  8:43 AM  ?[place] Adult Exercise  ?Frequency of aerobic exercise (# of days/week) 0 '3 3 3  '$ ?Average time in minutes 0 '30 30 30  '$ ?Frequency of strengthening activities (# of days/week) '5 1 1 1  '$ ? ? ?   ? View : No data to display.  ?  ?  ?  ? ? ?    ?Objective:  ?Physical Exam: ? ?Gen: NAD, comfortable in exam room ? ?Back: ?No  gross deformity, scoliosis. ?TTP left lumbar paraspinal region.  No midline or bony TTP. ?FROM - pain worse with trunk rotation. ?Strength LEs 5/5 all muscle groups.   ?2+ MSRs in patellar and achilles tendons, equal bilaterally. ?Negative SLRs. ?Sensation intact to light touch bilaterally. ? ?Assessment & Plan:  ?1. Low back pain - 2/2 lumbar strain.  Tylenol, aleve.  Flexeril if needed for spasms.  Home exercises and stretches reviewed - will add physical therapy for this as well.  F/u in 1 month or prn if doing well for this issue. ? 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  Consider foam rolling but not  on bony prominences. ?

## 2021-08-28 DIAGNOSIS — M25461 Effusion, right knee: Secondary | ICD-10-CM | POA: Diagnosis not present

## 2021-08-28 DIAGNOSIS — M25561 Pain in right knee: Secondary | ICD-10-CM | POA: Diagnosis not present

## 2021-08-28 DIAGNOSIS — M25562 Pain in left knee: Secondary | ICD-10-CM | POA: Diagnosis not present

## 2021-08-28 DIAGNOSIS — M25462 Effusion, left knee: Secondary | ICD-10-CM | POA: Diagnosis not present

## 2021-08-28 DIAGNOSIS — M62551 Muscle wasting and atrophy, not elsewhere classified, right thigh: Secondary | ICD-10-CM | POA: Diagnosis not present

## 2021-08-28 DIAGNOSIS — M546 Pain in thoracic spine: Secondary | ICD-10-CM | POA: Diagnosis not present

## 2021-08-28 DIAGNOSIS — M545 Low back pain, unspecified: Secondary | ICD-10-CM | POA: Diagnosis not present

## 2021-08-28 DIAGNOSIS — M62552 Muscle wasting and atrophy, not elsewhere classified, left thigh: Secondary | ICD-10-CM | POA: Diagnosis not present

## 2021-09-02 DIAGNOSIS — F411 Generalized anxiety disorder: Secondary | ICD-10-CM | POA: Diagnosis not present

## 2021-09-04 ENCOUNTER — Encounter: Payer: Self-pay | Admitting: Family Medicine

## 2021-09-17 DIAGNOSIS — H5202 Hypermetropia, left eye: Secondary | ICD-10-CM | POA: Diagnosis not present

## 2021-10-01 DIAGNOSIS — F411 Generalized anxiety disorder: Secondary | ICD-10-CM | POA: Diagnosis not present

## 2021-10-08 DIAGNOSIS — F411 Generalized anxiety disorder: Secondary | ICD-10-CM | POA: Diagnosis not present

## 2021-10-15 DIAGNOSIS — F411 Generalized anxiety disorder: Secondary | ICD-10-CM | POA: Diagnosis not present

## 2021-10-22 DIAGNOSIS — F411 Generalized anxiety disorder: Secondary | ICD-10-CM | POA: Diagnosis not present

## 2021-10-28 DIAGNOSIS — F411 Generalized anxiety disorder: Secondary | ICD-10-CM | POA: Diagnosis not present

## 2021-11-06 DIAGNOSIS — F411 Generalized anxiety disorder: Secondary | ICD-10-CM | POA: Diagnosis not present

## 2021-11-12 DIAGNOSIS — F411 Generalized anxiety disorder: Secondary | ICD-10-CM | POA: Diagnosis not present

## 2021-11-18 ENCOUNTER — Other Ambulatory Visit (HOSPITAL_COMMUNITY): Payer: Self-pay

## 2021-11-18 ENCOUNTER — Ambulatory Visit: Payer: 59 | Admitting: Family Medicine

## 2021-11-18 VITALS — BP 122/80 | Ht 77.0 in | Wt 240.0 lb

## 2021-11-18 DIAGNOSIS — M25561 Pain in right knee: Secondary | ICD-10-CM

## 2021-11-18 DIAGNOSIS — M25462 Effusion, left knee: Secondary | ICD-10-CM

## 2021-11-18 DIAGNOSIS — M549 Dorsalgia, unspecified: Secondary | ICD-10-CM

## 2021-11-18 DIAGNOSIS — G8929 Other chronic pain: Secondary | ICD-10-CM

## 2021-11-18 DIAGNOSIS — M25562 Pain in left knee: Secondary | ICD-10-CM
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
## 2021-11-23 ENCOUNTER — Encounter: Payer: Self-pay | Admitting: Family Medicine

## 2021-11-23 ENCOUNTER — Ambulatory Visit (INDEPENDENT_AMBULATORY_CARE_PROVIDER_SITE_OTHER): Payer: 59 | Admitting: Family Medicine

## 2021-11-23 ENCOUNTER — Ambulatory Visit (INDEPENDENT_AMBULATORY_CARE_PROVIDER_SITE_OTHER): Payer: Self-pay | Admitting: Family Medicine

## 2021-11-23 VITALS — BP 122/84 | Ht 77.0 in | Wt 241.6 lb

## 2021-11-23 VITALS — BP 122/84 | Ht 77.0 in | Wt 241.0 lb

## 2021-11-23 DIAGNOSIS — M25462 Effusion, left knee: Secondary | ICD-10-CM | POA: Diagnosis not present

## 2021-11-23 DIAGNOSIS — M7651 Patellar tendinitis, right knee: Secondary | ICD-10-CM

## 2021-11-23 MED ORDER — METHYLPREDNISOLONE ACETATE 40 MG/ML IJ SUSP
40.0000 mg | Freq: Once | INTRAMUSCULAR | Status: AC
  Administered 2021-11-23: 40 mg via INTRA_ARTICULAR

## 2021-11-23 NOTE — Progress Notes (Signed)
Patient returns today to start shockwave treatment on right knee patellar tendinopathy.  Procedure: ECSWT Indications:  right patellar tendinopathy   Procedure Details Consent: Risks of procedure as well as the alternatives and risks of each were explained to the patient.  Written consent for procedure obtained. Time Out: Verified patient identification, verified procedure, site was marked, verified correct patient position, medications/allergies/relevent history reviewed.  The area was cleaned with alcohol swab.     The right patellar tendon was targeted for Extracorporeal shockwave therapy.    Preset: patellar tendinitis Power Level: 90 Frequency: 10 Impulse/cycles: 2000 Head size: large   Patient tolerated procedure well without immediate complications.  He will follow up in 1 week for second treatment.

## 2021-11-23 NOTE — Progress Notes (Signed)
Patient here today for aspiration of left knee, plan to send again for analysis.  After informed written consent timeout was performed, patient was lying supine on exam table.  Left knee was prepped with alcohol swab.  Utilizing superolateral approach, 3 mL of lidocaine was used for local anesthesia.  Then using an 18g needle on 50cc syringe,44 mL of cloudy straw-colored fluid was aspirated from left knee with ultrasound guidance.  Knee was then injected with 3:1 lidocaine:depomedrol.  Patient tolerated procedure well without immediate complications.  Fluid sent for cell count, crystals.

## 2021-11-24 LAB — SYNOVIAL FLUID, CELL COUNT
Eos, Fluid: 0 %
Lining Cells, Synovial: 0 %
Lymphs, Fluid: 19 %
Macrophages Fld: 0 %
Nuc cell # Fld: 7398 cells/uL — ABNORMAL HIGH (ref 0–200)
Polys, Fluid: 81 %
RBC, Fluid: 8000 /uL

## 2021-11-25 ENCOUNTER — Other Ambulatory Visit (HOSPITAL_BASED_OUTPATIENT_CLINIC_OR_DEPARTMENT_OTHER): Payer: Self-pay | Admitting: Family Medicine

## 2021-11-25 ENCOUNTER — Other Ambulatory Visit (HOSPITAL_COMMUNITY): Payer: Self-pay

## 2021-11-25 DIAGNOSIS — K219 Gastro-esophageal reflux disease without esophagitis: Secondary | ICD-10-CM

## 2021-11-25 MED ORDER — PANTOPRAZOLE SODIUM 40 MG PO TBEC
40.0000 mg | DELAYED_RELEASE_TABLET | Freq: Every day | ORAL | 1 refills | Status: DC
  Filled 2021-11-25: qty 90, 90d supply, fill #0
  Filled 2022-02-24: qty 90, 90d supply, fill #1

## 2021-11-26 ENCOUNTER — Other Ambulatory Visit: Payer: Self-pay

## 2021-11-26 DIAGNOSIS — F411 Generalized anxiety disorder: Secondary | ICD-10-CM | POA: Diagnosis not present

## 2021-11-26 DIAGNOSIS — M25462 Effusion, left knee: Secondary | ICD-10-CM

## 2021-12-02 ENCOUNTER — Ambulatory Visit (INDEPENDENT_AMBULATORY_CARE_PROVIDER_SITE_OTHER): Payer: Self-pay | Admitting: Family Medicine

## 2021-12-02 DIAGNOSIS — M7651 Patellar tendinitis, right knee: Secondary | ICD-10-CM

## 2021-12-02 DIAGNOSIS — F411 Generalized anxiety disorder: Secondary | ICD-10-CM | POA: Diagnosis not present

## 2021-12-02 NOTE — Progress Notes (Signed)
Patient returns for second shockwave treatment for right patellar tendinopathy.  Doing much better after first treatment and using nitro patches still.  He's had several weeks of lateral an plantar right foot pain - wears inserts with good arch support.  Better once he gets going in the morning.  No injury.  Procedure: ECSWT Indications:  right patellar tendinitis   Procedure Details Consent: Risks of procedure as well as the alternatives and risks of each were explained to the patient.  Written consent for procedure obtained. Time Out: Verified patient identification, verified procedure, site was marked, verified correct patient position, medications/allergies/relevent history reviewed.  The area was cleaned with alcohol swab.     The right patellar tendon was targeted for Extracorporeal shockwave therapy.    Preset: patellar tendinitis Power Level: 100 Frequency: 10 Impulse/cycles: 2000 Head size: large   Patient tolerated procedure well without immediate complications   Limited MSK u/s right foot showed no abnormalities of 5th metatarsal in area of pain.    Discussed options for his foot - added lateral heel wedge.  Can consider lateral midfoot posting, arch supports without as much of long arch support so he's not overloading lateral column as much.

## 2021-12-09 ENCOUNTER — Ambulatory Visit: Payer: Self-pay | Admitting: Family Medicine

## 2021-12-09 DIAGNOSIS — M7651 Patellar tendinitis, right knee: Secondary | ICD-10-CM

## 2021-12-10 DIAGNOSIS — F411 Generalized anxiety disorder: Secondary | ICD-10-CM | POA: Diagnosis not present

## 2021-12-10 NOTE — Progress Notes (Signed)
Patient returns for third shockwave treatment for right patellar tendinitis.  He continues to improve.  Procedure: ECSWT Indications:  right patellar tendinitis   Procedure Details Consent: Risks of procedure as well as the alternatives and risks of each were explained to the patient.  Written consent for procedure obtained. Time Out: Verified patient identification, verified procedure, site was marked, verified correct patient position, medications/allergies/relevent history reviewed.  The area was cleaned with alcohol swab.     The right patellar tendon was targeted for Extracorporeal shockwave therapy.    Preset: patellar tendinitis Power Level: 100 Frequency: 10 Impulse/cycles: 2000 Head size: large   Patient tolerated procedure well without immediate complications

## 2021-12-15 ENCOUNTER — Other Ambulatory Visit (HOSPITAL_COMMUNITY): Payer: Self-pay

## 2021-12-15 DIAGNOSIS — H00021 Hordeolum internum right upper eyelid: Secondary | ICD-10-CM | POA: Diagnosis not present

## 2021-12-15 MED ORDER — DOXYCYCLINE MONOHYDRATE 50 MG PO CAPS
50.0000 mg | ORAL_CAPSULE | Freq: Every day | ORAL | 3 refills | Status: DC
  Filled 2021-12-15 – 2022-01-11 (×2): qty 30, 30d supply, fill #0
  Filled 2022-02-24: qty 30, 30d supply, fill #1
  Filled 2022-04-23: qty 30, 30d supply, fill #2
  Filled 2022-06-01: qty 30, 30d supply, fill #3

## 2021-12-15 MED ORDER — NEOMYCIN-POLYMYXIN-DEXAMETH 3.5-10000-0.1 OP SUSP
1.0000 [drp] | Freq: Four times a day (QID) | OPHTHALMIC | 0 refills | Status: DC
  Filled 2021-12-15: qty 5, 25d supply, fill #0

## 2021-12-15 MED ORDER — CEPHALEXIN 500 MG PO CAPS
500.0000 mg | ORAL_CAPSULE | Freq: Two times a day (BID) | ORAL | 0 refills | Status: DC
  Filled 2021-12-15: qty 20, 10d supply, fill #0

## 2021-12-17 DIAGNOSIS — F411 Generalized anxiety disorder: Secondary | ICD-10-CM | POA: Diagnosis not present

## 2021-12-18 ENCOUNTER — Other Ambulatory Visit (HOSPITAL_COMMUNITY): Payer: Self-pay

## 2021-12-21 ENCOUNTER — Ambulatory Visit (INDEPENDENT_AMBULATORY_CARE_PROVIDER_SITE_OTHER): Payer: Self-pay | Admitting: Family Medicine

## 2021-12-21 DIAGNOSIS — M7651 Patellar tendinitis, right knee: Secondary | ICD-10-CM

## 2021-12-21 NOTE — Progress Notes (Signed)
Patient returns for fourth shockwave treatment right patellar tendon.  Procedure: ECSWT Indications:  right patellar tendinopathy   Procedure Details Consent: Risks of procedure as well as the alternatives and risks of each were explained to the patient.  Written consent for procedure obtained. Time Out: Verified patient identification, verified procedure, site was marked, verified correct patient position, medications/allergies/relevent history reviewed.  The area was cleaned with alcohol swab.     The right patellar tendon was targeted for Extracorporeal shockwave therapy.    Preset: patellar tendinitis Power Level: 100 Frequency: 10 Impulse/cycles: 2000 Head size: large   Patient tolerated procedure well without immediate complications.  Also placed 5th midfoot posting right insert - brief ultrasound shows normal 5th metatarsal but he's having pain localized to this part of his foot consistent with lateral column overload.

## 2021-12-23 DIAGNOSIS — M7918 Myalgia, other site: Secondary | ICD-10-CM | POA: Diagnosis not present

## 2021-12-23 DIAGNOSIS — M5459 Other low back pain: Secondary | ICD-10-CM | POA: Diagnosis not present

## 2021-12-23 DIAGNOSIS — M9905 Segmental and somatic dysfunction of pelvic region: Secondary | ICD-10-CM | POA: Diagnosis not present

## 2021-12-23 DIAGNOSIS — M9904 Segmental and somatic dysfunction of sacral region: Secondary | ICD-10-CM | POA: Diagnosis not present

## 2021-12-23 DIAGNOSIS — M9902 Segmental and somatic dysfunction of thoracic region: Secondary | ICD-10-CM | POA: Diagnosis not present

## 2021-12-23 DIAGNOSIS — M9903 Segmental and somatic dysfunction of lumbar region: Secondary | ICD-10-CM | POA: Diagnosis not present

## 2021-12-24 DIAGNOSIS — F411 Generalized anxiety disorder: Secondary | ICD-10-CM | POA: Diagnosis not present

## 2021-12-28 DIAGNOSIS — M9903 Segmental and somatic dysfunction of lumbar region: Secondary | ICD-10-CM | POA: Diagnosis not present

## 2021-12-28 DIAGNOSIS — M5459 Other low back pain: Secondary | ICD-10-CM | POA: Diagnosis not present

## 2021-12-28 DIAGNOSIS — M7918 Myalgia, other site: Secondary | ICD-10-CM | POA: Diagnosis not present

## 2021-12-28 DIAGNOSIS — M9904 Segmental and somatic dysfunction of sacral region: Secondary | ICD-10-CM | POA: Diagnosis not present

## 2021-12-28 DIAGNOSIS — M9905 Segmental and somatic dysfunction of pelvic region: Secondary | ICD-10-CM | POA: Diagnosis not present

## 2021-12-28 DIAGNOSIS — M9902 Segmental and somatic dysfunction of thoracic region: Secondary | ICD-10-CM | POA: Diagnosis not present

## 2021-12-30 DIAGNOSIS — F411 Generalized anxiety disorder: Secondary | ICD-10-CM | POA: Diagnosis not present

## 2022-01-01 DIAGNOSIS — M9903 Segmental and somatic dysfunction of lumbar region: Secondary | ICD-10-CM | POA: Diagnosis not present

## 2022-01-01 DIAGNOSIS — M5459 Other low back pain: Secondary | ICD-10-CM | POA: Diagnosis not present

## 2022-01-01 DIAGNOSIS — M9905 Segmental and somatic dysfunction of pelvic region: Secondary | ICD-10-CM | POA: Diagnosis not present

## 2022-01-01 DIAGNOSIS — M7918 Myalgia, other site: Secondary | ICD-10-CM | POA: Diagnosis not present

## 2022-01-01 DIAGNOSIS — M9904 Segmental and somatic dysfunction of sacral region: Secondary | ICD-10-CM | POA: Diagnosis not present

## 2022-01-01 DIAGNOSIS — M9902 Segmental and somatic dysfunction of thoracic region: Secondary | ICD-10-CM | POA: Diagnosis not present

## 2022-01-11 ENCOUNTER — Other Ambulatory Visit (HOSPITAL_COMMUNITY): Payer: Self-pay

## 2022-01-13 DIAGNOSIS — F411 Generalized anxiety disorder: Secondary | ICD-10-CM | POA: Diagnosis not present

## 2022-01-18 ENCOUNTER — Other Ambulatory Visit (HOSPITAL_COMMUNITY): Payer: Self-pay

## 2022-01-20 ENCOUNTER — Encounter: Payer: Self-pay | Admitting: Family Medicine

## 2022-01-21 DIAGNOSIS — F411 Generalized anxiety disorder: Secondary | ICD-10-CM | POA: Diagnosis not present

## 2022-01-25 NOTE — Progress Notes (Unsigned)
Office Visit Note  Patient: Luis Khan             Date of Birth: Oct 26, 1985           MRN: [id number]             PCP: de [place], Raymond J, MD Referring: Dene Gentry, MD Visit Date: 01/26/2022 Occupation: [employer]  Subjective:  New Patient (Initial Visit) (Joint pain all over--especially left knee (fluid w/ high WBC). General movement causes pain in hips, shoulders, knees, and back. )   History of Present Illness: Luis Khan is a 36 y.o. male here for evaluation of recurrent left knee effusion also joint pains increased in multiple areas. He has been noticing new or increased symptoms since last summer with left knee effusion and restricted range of motion. He also had ongoing right knee pain with patellar tendonitis.  Imaging without significant OA changes and knee aspiration with moderate WBC increase otherwise bland. The knee effusion remained improved for months and he participated in PT for several months with a good benefit in his symptoms. However over time and especially since finishing PT symptoms are worsening again now including mid back pain, shoulders, hips, and knee pains. His back pain is most severe at night and first thing in the mornings. He sees a large but incomplete improvement with taking 2 tablets of naproxen, most effective at night time to reduce morning symptoms. He had recurrence of left knee effusion and aspiration by Dr. Barbaraann Barthel with findings of elevated cell count both RBCs and WBCs 7398. Previous aspiration from a year ago with elevated WBCs 4067. He does have a history of skin psoriasis, referred to new dermatology last October for increase in symptoms and he is controlling this with topical clobetasol. He has chronic GI symptoms attributed to IBS and also history of celiac disease. Currently these symptoms are at about normal baseline. He was also seeing his eye doctor recent with recurrent styes or hordeolum, but no history  of iritis or uveitis known.   Labs reviewed 02/2021 RF neg ANA neg ESR 4 CRP 5  Imaging reviewed 02/10/2021 MRI Left knee IMPRESSION: Focal mild bony edema within the medial aspect of the mid patella, near the MPFL/medial retinacular attachment. The MPFL/medial retinaculum appear intact. This could represent a focal bony contusion or traction injury.   Moderate-sized joint effusion.   No evidence of meniscus tear.  Activities of Daily Living:  Patient reports morning stiffness for Reports 2  hours.   Patient Reports nocturnal pain.  Difficulty dressing/grooming: Denies Difficulty climbing stairs: Reports Difficulty getting out of chair: Reports Difficulty using hands for taps, buttons, cutlery, and/or writing: Denies  Review of Systems  Constitutional:  Positive for fatigue.  HENT:  Negative for mouth sores and mouth dryness.   Eyes:  Negative for dryness.  Respiratory:  Negative for shortness of breath.   Cardiovascular:  Negative for chest pain and palpitations.  Gastrointestinal:  Negative for blood in stool, constipation and diarrhea.  Endocrine: Negative for increased urination.  Genitourinary:  Negative for involuntary urination.  Musculoskeletal:  Positive for joint pain, gait problem, joint pain, joint swelling, myalgias, muscle weakness, morning stiffness, muscle tenderness and myalgias.  Skin:  Positive for color change and rash. Negative for hair loss and sensitivity to sunlight.  Allergic/Immunologic: Negative for susceptible to infections.  Neurological:  Negative for dizziness and headaches.  Hematological:  Negative for swollen glands.  Psychiatric/Behavioral:  Positive for depressed mood.  Negative for sleep disturbance. The patient is nervous/anxious.     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   Knee pain, chronic 03/04/2021   Sudden visual loss, right eye 02/17/2021   Hordeolum externum of right upper eyelid 07/19/2018   Gastroesophageal reflux disease 04/09/2015    Past Medical History:  Diagnosis Date   Allergy    Anxiety    Blood transfusion without reported diagnosis    Chronic allergic rhinitis    Flat feet, bilateral    GERD (gastroesophageal reflux disease) 10/03/2014   NASH (nonalcoholic steatohepatitis)    Patellar tendinitis    Right Knee   Premature labor, onset of delivery before 37 weeks of gestation, fetus 5 07/09/2015   Psoriasis 09/2814    Family History  Problem Relation Age of Onset   Irritable bowel syndrome Mother    Kidney Stones Mother    Hypertension Father    Gallstones Father    Hyperlipidemia Father    Gallbladder disease Maternal Aunt    Kidney cancer Maternal Aunt    Diabetes Mellitus II Maternal Uncle    Diabetes Maternal Uncle    Pancreatic cancer Maternal Grandmother    Pancreatic disease Maternal Grandmother    Cancer Maternal Grandmother    Dementia Maternal Grandfather    Heart attack Maternal Grandfather    Gallstones Paternal Grandmother    Drug abuse Paternal Grandmother    Hypertension Paternal Grandmother    Kidney Stones Paternal Grandmother    Congestive Heart Failure Paternal Grandfather    Coronary artery disease Paternal Grandfather    Heart attack Paternal Grandfather    Diabetes Paternal Grandfather    Healthy Daughter    Past Surgical History:  Procedure Laterality Date   CHOLECYSTECTOMY N/A 07/23/2016   Procedure: LAPAROSCOPIC CHOLECYSTECTOMY WITH INTRAOPERATIVE CHOLANGIOGRAM;  Surgeon: Leonie Green, MD;  Location: [facility] ORS;  Service: General;  Laterality: N/A;   ESOPHAGOGASTRODUODENOSCOPY (EGD) WITH PROPOFOL N/A 08/01/2015   Procedure: ESOPHAGOGASTRODUODENOSCOPY  (EGD) WITH PROPOFOL;  Surgeon: Lollie Sails, MD;  Location: [facility] ENDOSCOPY;  Service: Endoscopy;  Laterality: N/A;   WISDOM TOOTH EXTRACTION     Social History   Social History Narrative   Not on file   Immunization History  Administered Date(s) Administered   Influenza,inj,Quad PF,6+ Mos 04/03/2019   Influenza-Unspecified 03/02/2018, 02/27/2020, 04/03/2021   Tdap 01/31/2018     Objective: Vital Signs: BP 137/89 (BP Location: Left Arm, Patient Position: Sitting, Cuff Size: Normal)   Pulse 71   Resp 15   Ht 6' 3" (1.905 m)   Wt 242 lb (109.8 kg)   BMI 30.25 kg/m    Physical Exam HENT:     Mouth/Throat:     Mouth: Mucous membranes are moist.     Pharynx: Oropharynx is clear.  Eyes:     Conjunctiva/sclera: Conjunctivae normal.  Cardiovascular:     Rate and Rhythm: Normal rate and regular rhythm.  Pulmonary:     Effort: Pulmonary effort is normal.     Breath sounds: Normal breath sounds.  Musculoskeletal:     Right lower leg: No edema.     Left lower leg: No edema.  Lymphadenopathy:     Cervical: No cervical adenopathy.  Skin:  General: Skin is warm and dry.     Findings: Rash present.  Neurological:     Mental Status: He is alert.     Motor: No weakness.     Gait: Gait normal.  Psychiatric:        Mood and Affect: Mood normal.     Musculoskeletal Exam:  Shoulders full ROM, mild pain provoked with full overhead abduction slight tenderness to lateral joint pressure no palpable swelling Elbows full ROM no tenderness or swelling Wrists full ROM no tenderness or swelling Fingers full ROM no tenderness or swelling There is some paraspinal muscle tenderness to palpation in the mid back, no low back or sacral midline or paraspinal tenderness No significant lateral hip tenderness to pressure, normal internal and external rotation but FADIR is restricted with pressure in the posterior and lateral thigh and hip on both sides, FABER ROM intact and no posterior pain  provoked Knees full ROM but stiff in full extension worse on the left, no palpable effusions Ankles full ROM no tenderness or swelling   Investigation: No additional findings.  Imaging: XR Pelvis 1-2 Views  Result Date: 01/26/2022 X-ray pelvis 2 views AP and Ferguson Hip joints appear normal bilaterally.  SI joints appear patent difficult to visualize superior edge of the left side.  No significant increased sclerosis on sacral or iliac surfaces. Impression No plain xray abnormalities of hip arthritis or sacroiliitis  XR Lumbar Spine 2-3 Views  Result Date: 01/26/2022 X-ray lumbar spine 2 views AP and lateral Vertebral bodies appear normal and no significant loss of disc space height.  No significant spondylolisthesis seen.  May be some early degenerative change at the posterior edge of L5-S1.  There is definite straightening of the normal lumbar lordosis.  No osteophyte or syndesmophyte process is seen. 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  Procedures:  No procedures performed Allergies: Augmentin [amoxicillin-pot clavulanate] and Z-pak [azithromycin]   Assessment / Plan:     Visit Diagnoses: Chronic pain of both knees - Plan: Sedimentation rate, C-reactive protein  Persistent knee pains with recurrent effusions on the left knee definitely inflammatory fluid on analysis. Not much inflammation  on exam today and no definite swelling elsewhere. Checking ESR and CRP again for evidence of systemic inflammation.  No underlying structural changes to explain symptoms on MRI.  My leading suspicion would be possible psoriatic arthritis with some peripheral joint involvement.  Back pain and checking x-rays for any evidence of an axial spondyloarthritis.  Psoriasis  Longstanding, active more than usual recently. Currently on topical steroids if starting on systemic DMARD medication would dissipate skin improvement as well.  Chronic bilateral low back pain without sciatica - Plan: XR Lumbar Spine 2-3 Views, XR Pelvis 1-2 Views, HLA-B27 antigen  Chronic back pain does have nighttime symptoms and awakening and prolonged morning  stiffness.  Distribution is mid to low back less focal symptoms overlying the pelvis.  X-rays lumbar spine and pelvis checked in clinic today without radiographic evidence of sacroiliitis lumbar spine does not have much degenerative change but does show lordosis straightening.  High risk medication use - Plan: CBC with Differential/Platelet, COMPLETE METABOLIC PANEL WITH GFR, Hepatitis B core antibody, IgM, Hepatitis B surface antigen, Hepatitis C antibody, QuantiFERON-TB Gold Plus  Anticipate starting systemic DMARD medication so checking baseline labs for monitoring including CBC CMP hepatitis serology and QuantiFERON.  Orders: Orders Placed This Encounter  Procedures   XR Lumbar Spine 2-3 Views   XR Pelvis 1-2 Views   Sedimentation rate   C-reactive protein   HLA-B27 antigen   CBC with Differential/Platelet   COMPLETE METABOLIC PANEL WITH GFR   Hepatitis B core antibody, IgM   Hepatitis B surface antigen   Hepatitis C antibody   QuantiFERON-TB Gold Plus   No orders of the defined types were placed in this encounter.   Follow-Up Instructions: Return in about 2 weeks (around 02/09/2022) for New pt ?PsA f/u 2-3wks.   Collier Salina, MD  Note - This record  has been created using [employer].  Chart creation errors have been sought, but may not always  have been located. Such creation errors do not reflect on  the standard of medical care.

## 2022-01-26 ENCOUNTER — Ambulatory Visit: Payer: 59 | Attending: Internal Medicine | Admitting: Internal Medicine

## 2022-01-26 ENCOUNTER — Ambulatory Visit (INDEPENDENT_AMBULATORY_CARE_PROVIDER_SITE_OTHER): Payer: 59

## 2022-01-26 ENCOUNTER — Encounter: Payer: Self-pay | Admitting: Internal Medicine

## 2022-01-26 VITALS — BP 137/89 | HR 71 | Resp 15 | Ht 75.0 in | Wt 242.0 lb

## 2022-01-26 DIAGNOSIS — G8929 Other chronic pain: Secondary | ICD-10-CM

## 2022-01-26 DIAGNOSIS — M545 Low back pain, unspecified: Secondary | ICD-10-CM

## 2022-01-26 DIAGNOSIS — M25562 Pain in left knee: Secondary | ICD-10-CM | POA: Diagnosis not present

## 2022-01-26 DIAGNOSIS — L409 Psoriasis, unspecified: Secondary | ICD-10-CM | POA: Diagnosis not present

## 2022-01-26 DIAGNOSIS — Z79899 Other long term (current) drug therapy: Secondary | ICD-10-CM

## 2022-01-26 DIAGNOSIS — M25561 Pain in right knee: Secondary | ICD-10-CM | POA: Diagnosis not present

## 2022-01-27 ENCOUNTER — Ambulatory Visit: Payer: 59 | Admitting: Family Medicine

## 2022-01-27 VITALS — BP 125/82 | Ht 77.0 in | Wt 240.0 lb

## 2022-01-27 DIAGNOSIS — M25561 Pain in right knee: Secondary | ICD-10-CM

## 2022-01-27 DIAGNOSIS — M25562 Pain in left knee: Secondary | ICD-10-CM

## 2022-01-27 DIAGNOSIS — G8929 Other chronic pain: Secondary | ICD-10-CM

## 2022-01-27 MED ORDER — METHYLPREDNISOLONE ACETATE 40 MG/ML IJ SUSP
40.0000 mg | Freq: Once | INTRAMUSCULAR | Status: AC
  Administered 2022-01-27: 40 mg via INTRA_ARTICULAR

## 2022-01-27 NOTE — Progress Notes (Unsigned)
   Established Patient Office Visit  Subjective   Patient ID: Luis Khan, male    DOB: Nov 09, 1985  Age: 36 y.o. MRN: [id number]  L knee pain.  Luis Khan is a 36 year old male who presents today for follow-up on left knee pain.  He did have aspiration and steroid injection in June to that left knee which provided some relief.  He is mostly having pain in the lateral side of his knee during golf with twisting motion.  He has been using Aleve twice a day as needed which provides some some relief but he still having discomfort.  He denies any new injury to the area, locking or buckling.  Of note he has a golf tournament in 2 weeks that he would like to be improved for. He was recently seen by rheumatology with labs pending, however notable elevation in ESR and sed rate.  He will follow-up with rheumatology in 2 weeks. ROS as listed above in HPI.   Objective:     BP 125/82   Ht $R'6\' 5"'oB$  (1.956 m)   Wt 240 lb (108.9 kg)   BMI 28.46 kg/m   Physical Exam Vitals reviewed.  Constitutional:      General: He is not in acute distress.    Appearance: Normal appearance. He is not ill-appearing, toxic-appearing or diaphoretic.  Pulmonary:     Effort: Pulmonary effort is normal.  Neurological:     Mental Status: He is alert.   Left knee: No obvious deformity or asymmetry.  No ecchymosis or effusion.  Poor quad tone.  No tenderness to palpation along medial or lateral joint line.  Decreased range of motion for approximately 5 degrees 140 degrees.  Stable to valgus and varus stress.  Some discomfort with McMurray's and Thessaly's however no catching.  Negative Lachman's. Right knee: No obvious deformity or asymmetry.  No ecchymosis or effusion.  Slight tenderness to palpation along the tibial tubercle.  No tenderness to palpation along the patella tendon body or origin of the inferior patellar pole.  Full range of motion from 0 to 140 degrees.    Assessment & Plan:   After informed written consent  timeout was performed, patient was seated on exam table. Left knee was prepped with alcohol swab and utilizing anteromedial approach, patient's left knee was injected intraarticularly with 3:1 lidocaine: depomedrol. Patient tolerated the procedure well without immediate complications.   Problem List Items Addressed This Visit       Other   Knee pain, chronic - Primary    Intra-articular steroid injection performed today, as detailed as above.  He just recently started a work-up with rheumatology.  We will hold off on any other measures at this time until completed labs and rheumatology follow-up.  Patient was given a handout for quad strengthening.  Follow-up after rheumatology visit. Right knee patella tendinitis status post shockwave.  Improvement in pain of the patella tendon and some discomfort with palpation to tibial tubercle.  Recommend icing for pain.       Return in about 6 weeks (around 03/10/2022), or if symptoms worsen or fail to improve.    Elmore Guise, DO

## 2022-01-27 NOTE — Assessment & Plan Note (Addendum)
Intra-articular steroid injection performed today, as detailed as above.  He just recently started a work-up with rheumatology.  We will hold off on any other measures at this time until completed labs and rheumatology follow-up.  Patient was given a handout for quad strengthening.  Follow-up after rheumatology visit. Right knee patella tendinitis status post shockwave.  Improvement in pain of the patella tendon and some discomfort with palpation to tibial tubercle.  Recommend icing for pain.

## 2022-01-28 ENCOUNTER — Encounter: Payer: Self-pay | Admitting: Family Medicine

## 2022-01-28 DIAGNOSIS — F411 Generalized anxiety disorder: Secondary | ICD-10-CM | POA: Diagnosis not present

## 2022-01-29 LAB — COMPLETE METABOLIC PANEL WITH GFR
AG Ratio: 1.4 (calc) (ref 1.0–2.5)
ALT: 41 U/L (ref 9–46)
AST: 27 U/L (ref 10–40)
Albumin: 4.6 g/dL (ref 3.6–5.1)
Alkaline phosphatase (APISO): 71 U/L (ref 36–130)
BUN: 15 mg/dL (ref 7–25)
CO2: 25 mmol/L (ref 20–32)
Calcium: 9.7 mg/dL (ref 8.6–10.3)
Chloride: 103 mmol/L (ref 98–110)
Creat: 0.91 mg/dL (ref 0.60–1.26)
Globulin: 3.4 g/dL (calc) (ref 1.9–3.7)
Glucose, Bld: 84 mg/dL (ref 65–99)
Potassium: 4.5 mmol/L (ref 3.5–5.3)
Sodium: 138 mmol/L (ref 135–146)
Total Bilirubin: 1.1 mg/dL (ref 0.2–1.2)
Total Protein: 8 g/dL (ref 6.1–8.1)
eGFR: 111 mL/min/{1.73_m2} (ref >=60)

## 2022-01-29 LAB — CBC WITH DIFFERENTIAL/PLATELET
Absolute Monocytes: 482 cells/uL (ref 200–950)
Basophils Absolute: 40 cells/uL (ref 0–200)
Basophils Relative: 0.6 %
Eosinophils Absolute: 92 cells/uL (ref 15–500)
Eosinophils Relative: 1.4 %
HCT: 46.5 % (ref 38.5–50.0)
Hemoglobin: 15.8 g/dL (ref 13.2–17.1)
Lymphs Abs: 1716 cells/uL (ref 850–3900)
MCH: 30.7 pg (ref 27.0–33.0)
MCHC: 34 g/dL (ref 32.0–36.0)
MCV: 90.5 fL (ref 80.0–100.0)
MPV: 9.5 fL (ref 7.5–12.5)
Monocytes Relative: 7.3 %
Neutro Abs: 4270 cells/uL (ref 1500–7800)
Neutrophils Relative %: 64.7 %
Platelets: 287 10*3/uL (ref 140–400)
RBC: 5.14 10*6/uL (ref 4.20–5.80)
RDW: 12.4 % (ref 11.0–15.0)
Total Lymphocyte: 26 %
WBC: 6.6 10*3/uL (ref 3.8–10.8)

## 2022-01-29 LAB — HLA-B27 ANTIGEN: HLA-B27 Antigen: POSITIVE — AB

## 2022-01-29 LAB — HEPATITIS C ANTIBODY: Hepatitis C Ab: NONREACTIVE

## 2022-01-29 LAB — QUANTIFERON-TB GOLD PLUS
Mitogen-NIL: 9.87 IU/mL
NIL: 0.01 IU/mL
QuantiFERON-TB Gold Plus: NEGATIVE
TB1-NIL: 0 IU/mL
TB2-NIL: 0.01 IU/mL

## 2022-01-29 LAB — HEPATITIS B SURFACE ANTIGEN: Hepatitis B Surface Ag: NONREACTIVE

## 2022-01-29 LAB — SEDIMENTATION RATE: Sed Rate: 19 mm/h — ABNORMAL HIGH (ref 0–15)

## 2022-01-29 LAB — C-REACTIVE PROTEIN: CRP: 22.7 mg/L — ABNORMAL HIGH (ref <8.0)

## 2022-01-29 LAB — HEPATITIS B CORE ANTIBODY, IGM: Hep B C IgM: NONREACTIVE

## 2022-02-03 NOTE — Progress Notes (Signed)
Office Visit Note  Patient: Luis Khan             Date of Birth: 1985-12-14           MRN: [id number]             PCP: de [place], Raymond J, MD Referring: de [place], Raymond J, MD Visit Date: 02/10/2022   Subjective:  Follow-up (Looking forward to getting lab results and figuring out what is causing the pain in the hips down to knees. )   History of Present Illness: Luis Khan is a 36 y.o. male here for follow up for evaluation of recurrent left knee effusion and joint pains in shoulders, back, and hips. Labs at initial visit positive for HLA-B27 and elevated ESR and CRP.  Since her last visit he had a left knee steroid injection with Dr. Barbaraann Barthel with improvement in symptoms as he needs to be functional for an upcoming golf event this weekend.  Otherwise symptoms remain about the same he is still requiring maximal dose of naproxen to mostly control symptoms.  Previous HPI 01/26/2022 Luis Khan is a 36 y.o. male here for evaluation of recurrent left knee effusion also joint pains increased in multiple areas. He has been noticing new or increased symptoms since last summer with left knee effusion and restricted range of motion. He also had ongoing right knee pain with patellar tendonitis.  Imaging without significant OA changes and knee aspiration with moderate WBC increase otherwise bland. The knee effusion remained improved for months and he participated in PT for several months with a good benefit in his symptoms. However over time and especially since finishing PT symptoms are worsening again now including mid back pain, shoulders, hips, and knee pains. His back pain is most severe at night and first thing in the mornings. He sees a large but incomplete improvement with taking 2 tablets of naproxen, most effective at night time to reduce morning symptoms. He had recurrence of left knee effusion and aspiration by Dr. Barbaraann Barthel with findings of elevated cell count both RBCs and WBCs  7398. Previous aspiration from a year ago with elevated WBCs 4067. He does have a history of skin psoriasis, referred to new dermatology last October for increase in symptoms and he is controlling this with topical clobetasol. He has chronic GI symptoms attributed to IBS and also history of celiac disease. Currently these symptoms are at about normal baseline. He was also seeing his eye doctor recent with recurrent styes or hordeolum, but no history of iritis or uveitis known.    Labs reviewed 02/2021 RF neg ANA neg ESR 4 CRP 5   Imaging reviewed 02/10/2021 MRI Left knee IMPRESSION: Focal mild bony edema within the medial aspect of the mid patella, near the MPFL/medial retinacular attachment. The MPFL/medial retinaculum appear intact. This could represent a focal bony contusion or traction injury.   Moderate-sized joint effusion.   No evidence of meniscus tear.   Activities of Daily Living:  Patient reports morning stiffness for Reports 2  hours.   Patient Reports nocturnal pain.  Difficulty dressing/grooming: Denies Difficulty climbing stairs: Reports Difficulty getting out of chair: Reports Difficulty using hands for taps, buttons, cutlery, and/or writing: Denies   Review of Systems  Constitutional:  Positive for fatigue.  HENT:  Negative for mouth sores and mouth dryness.   Eyes:  Negative for dryness.  Respiratory:  Negative for shortness of breath.   Cardiovascular:  Negative for chest pain and palpitations.  Gastrointestinal:  Negative for blood in stool, constipation and diarrhea.  Endocrine: Negative for increased urination.  Genitourinary:  Negative for involuntary urination.  Musculoskeletal:  Positive for joint pain, gait problem, joint pain, joint swelling, muscle weakness and morning stiffness. Negative for myalgias, muscle tenderness and myalgias.  Skin:  Negative for color change, rash, hair loss and sensitivity to sunlight.  Allergic/Immunologic: Negative for  susceptible to infections.  Neurological:  Negative for dizziness and headaches.  Hematological:  Negative for swollen glands.  Psychiatric/Behavioral:  Negative for depressed mood and sleep disturbance. The patient is nervous/anxious.     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/2022   Low back pain 01/26/2022   High risk medication use 01/26/2022   Wellness examination 07/21/2021   Acute sinusitis, unspecified 05/01/2021   Environmental and seasonal allergies 03/17/2021   Psoriasis 03/17/2021   FCU (flexor carpi ulnaris) tenosynovitis 03/04/2021   Knee pain, chronic 03/04/2021   Sudden visual loss, right eye 02/17/2021   Hordeolum externum of right upper eyelid 07/19/2018   Gastroesophageal reflux disease 04/09/2015    Past Medical History:  Diagnosis Date   Allergy    Anxiety    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/10/2022   Blood transfusion without reported diagnosis    Chronic allergic rhinitis    Flat feet, bilateral    GERD (gastroesophageal reflux disease) 10/03/2014   NASH (nonalcoholic steatohepatitis)    Patellar tendinitis    Right Knee   Premature labor, onset of delivery before 37 weeks of gestation, fetus 5 07/09/2015   Psoriasis 09/2814    Family History  Problem Relation Age of Onset   Irritable bowel syndrome Mother    Kidney Stones Mother    Hypertension Father    Gallstones Father    Hyperlipidemia Father    Gallbladder disease Maternal Aunt    Kidney cancer Maternal Aunt    Diabetes Mellitus II Maternal Uncle    Diabetes Maternal Uncle    Pancreatic cancer Maternal Grandmother    Pancreatic disease Maternal Grandmother    Cancer Maternal Grandmother    Dementia Maternal Grandfather    Heart attack Maternal Grandfather    Gallstones Paternal Grandmother    Drug abuse Paternal Grandmother    Hypertension Paternal  Grandmother    Kidney Stones Paternal Grandmother    Congestive Heart Failure Paternal Grandfather    Coronary artery disease Paternal Grandfather    Heart attack Paternal Grandfather    Diabetes Paternal Grandfather    Healthy Daughter    Past Surgical History:  Procedure Laterality Date   CHOLECYSTECTOMY N/A 07/23/2016   Procedure: LAPAROSCOPIC CHOLECYSTECTOMY WITH INTRAOPERATIVE CHOLANGIOGRAM;  Surgeon: Leonie Green, MD;  Location: [facility] ORS;  Service: General;  Laterality: N/A;   ESOPHAGOGASTRODUODENOSCOPY (EGD) WITH PROPOFOL N/A 08/01/2015   Procedure: ESOPHAGOGASTRODUODENOSCOPY (EGD) WITH PROPOFOL;  Surgeon: Lollie Sails, MD;  Location: [facility] ENDOSCOPY;  Service: Endoscopy;  Laterality: N/A;   WISDOM TOOTH EXTRACTION     Social History   Social History Narrative   Not on file   Immunization History  Administered Date(s) Administered   Influenza,inj,Quad PF,6+ Mos 04/03/2019   Influenza-Unspecified 03/02/2018, 02/27/2020, 04/03/2021   Tdap 01/31/2018     Objective: Vital Signs: BP 132/83 (BP Location: Left Arm, Patient Position: Sitting, Cuff Size: Normal)   Pulse 60   Resp 15   Ht 6' 5" (1.956 m)   Wt 243  lb (110.2 kg)   BMI 28.82 kg/m    Physical Exam Cardiovascular:     Rate and Rhythm: Normal rate and regular rhythm.  Pulmonary:     Effort: Pulmonary effort is normal.     Breath sounds: Normal breath sounds.  Skin:    General: Skin is warm and dry.     Findings: No rash.  Neurological:     Mental Status: He is alert.  Psychiatric:        Mood and Affect: Mood normal.      Musculoskeletal Exam:  Shoulders full ROM some pain provoked with full overhead abduction Elbows full ROM no tenderness or swelling Wrists full ROM no tenderness or swelling Fingers full ROM no tenderness or swelling Mild dull to low back paraspinal muscle tenderness to palpation Some pain to posterior lateral of hips provoked with FADIR but range of motion grossly intact  bilaterally Knees full ROM no tenderness or swelling Ankles full ROM no tenderness or swelling   Investigation: No additional findings.  Imaging: XR Lumbar Spine 2-3 Views  Result Date: 02/10/2022 X-ray lumbar spine 2 views AP and lateral Vertebral bodies appear normal and no significant loss of disc space height.  No significant spondylolisthesis seen.  May be some early degenerative change at the posterior edge of L5-S1.  There is definite straightening of the normal lumbar lordosis.  No osteophyte or syndesmophyte process is seen. Impression No changes concerning for inflammatory or erosive changes, possibly early L5-S1 degenerative arthritis  XR Pelvis 1-2 Views  Result Date: 01/26/2022 X-ray pelvis 2 views AP and Ferguson Hip joints appear normal bilaterally.  SI joints appear patent difficult to visualize superior edge of the left side.  No significant increased sclerosis on sacral or iliac surfaces. Impression No plain xray abnormalities of hip arthritis or sacroiliitis   Recent Labs: Lab Results  Component Value Date   WBC 6.6 01/26/2022   HGB 15.8 01/26/2022   PLT 287 01/26/2022   NA 138 01/26/2022   K 4.5 01/26/2022   CL 103 01/26/2022   CO2 25 01/26/2022   GLUCOSE 84 01/26/2022   BUN 15 01/26/2022   CREATININE 0.91 01/26/2022   BILITOT 1.1 01/26/2022   ALKPHOS 70 07/15/2021   AST 27 01/26/2022   ALT 41 01/26/2022   PROT 8.0 01/26/2022   ALBUMIN 4.8 07/15/2021   CALCIUM 9.7 01/26/2022   GFRAA 107 12/26/2018   QFTBGOLDPLUS NEGATIVE 01/26/2022    Speciality Comments: No specialty comments available.  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  He does not have any history of inflammatory eye condition.  Has some chronic GI symptoms but more upper GI and GERD with previous EGD evaluation and no known IBD.  Discussed disease and treatment options I recommend starting on biologic DMARD due to axial disease involvement recommend initially trial of Enbrel 2 mg subcu weekly monotherapy.  Chronic pain of both knees  Currently doing better after steroid joint injection but definitely looks inflammatory with benign x-rays and inflammatory synovial fluid analysis.  High risk medication use  Discussed risks of Enbrel treatment including injection site reactions, infections, malignancy, and need for  intermittent lab monitoring.  Baseline labs already checked including TB and hepatitis screening are okay.  Psoriasis  I expect starting a TNF inhibitor will also improve his skin rashes.  Psoriatic arthritis could also explain his symptoms it would be my other suspicion if the HLA-B27 allele is in fact more incidental.  Regardless choosing initial treatment option with both diseases coverage should be effective.  Chronic bilateral low back pain without sciatica  I do not see definite erosions in SI joints or ankylosis of the lumbar spine though there are mild degenerative versus very early inflammatory changes.    Orders: No orders of the defined types were placed in this encounter.  No orders of the defined types were placed in this encounter.    Follow-Up Instructions: Return in about 10 weeks (around 04/21/2022) for AxSpA/PsA ENB start f/u 2-68mo.   CCollier Salina MD  Note - This record has been created using [employer]  Chart creation errors have been sought, but may not always  have been located. Such creation errors do not reflect on  the standard of medical care.

## 2022-02-10 ENCOUNTER — Telehealth: Payer: Self-pay

## 2022-02-10 ENCOUNTER — Telehealth: Payer: Self-pay | Admitting: Pharmacist

## 2022-02-10 ENCOUNTER — Encounter: Payer: Self-pay | Admitting: Internal Medicine

## 2022-02-10 ENCOUNTER — Ambulatory Visit: Payer: 59 | Attending: Internal Medicine | Admitting: Internal Medicine

## 2022-02-10 VITALS — BP 132/83 | HR 60 | Resp 15 | Ht 77.0 in | Wt 243.0 lb

## 2022-02-10 DIAGNOSIS — M452 Ankylosing spondylitis of cervical region: Secondary | ICD-10-CM | POA: Diagnosis not present

## 2022-02-10 DIAGNOSIS — M25561 Pain in right knee: Secondary | ICD-10-CM

## 2022-02-10 DIAGNOSIS — M545 Low back pain, unspecified: Secondary | ICD-10-CM

## 2022-02-10 DIAGNOSIS — M25562 Pain in left knee: Secondary | ICD-10-CM

## 2022-02-10 DIAGNOSIS — Z79899 Other long term (current) drug therapy: Secondary | ICD-10-CM

## 2022-02-10 DIAGNOSIS — L409 Psoriasis, unspecified: Secondary | ICD-10-CM

## 2022-02-10 DIAGNOSIS — G8929 Other chronic pain: Secondary | ICD-10-CM | POA: Diagnosis not present

## 2022-02-10 HISTORY — DX: Ankylosing spondylitis of cervical region: M45.2

## 2022-02-10 NOTE — Telephone Encounter (Addendum)
Please start Enbrel BIV pending OV note from 02/10/22 to be signed.  Dose: '50mg'$  SQ every 7 days  Once approved, patient will need to be enrolled into savings card.  [name], PharmD, MPH, BCPS, CPP Clinical Pharmacist (Rheumatology and Pulmonology)   ----- Message from Bertram Savin, RT sent at 02/10/2022  2:32 PM EDT ----- Regarding: Enbrel Patient to start Enbrel per Dr. Benjamine Mola.

## 2022-02-11 DIAGNOSIS — F411 Generalized anxiety disorder: Secondary | ICD-10-CM | POA: Diagnosis not present

## 2022-02-12 NOTE — Telephone Encounter (Signed)
Error

## 2022-02-15 NOTE — Telephone Encounter (Signed)
Submitted a Prior Authorization request to [facility] for ENBREL via CoverMyMeds. Will update once we receive a response.  Key: [id number]  Once approved, patient will need to be enrolled into copay card  [name], PharmD, MPH, BCPS, CPP Clinical Pharmacist (Rheumatology and Pulmonology)

## 2022-02-16 ENCOUNTER — Telehealth: Payer: Self-pay | Admitting: Internal Medicine

## 2022-02-16 NOTE — Telephone Encounter (Signed)
Patient called the office stating he had left a message with [facility] requesting an update on starting Enbrel and has not gotten a response. Patient states he would like a call back with an update as soon as possible.

## 2022-02-17 ENCOUNTER — Other Ambulatory Visit (HOSPITAL_COMMUNITY): Payer: Self-pay

## 2022-02-17 DIAGNOSIS — F411 Generalized anxiety disorder: Secondary | ICD-10-CM | POA: Diagnosis not present

## 2022-02-17 NOTE — Progress Notes (Signed)
Pharmacy Note  Subjective:   Patient presents to clinic today to receive first dose of Enbrel for axial spondyloarthritis. Patient has tried and failed NSAIDs including maximized naproxen dose. He also has history of skin psoriasis (for which he uses clobetasol solution for scalp and clobetasol cream for rest of body)  Patient running a fever or have signs/symptoms of infection? No  Patient currently on antibiotics for the treatment of infection? No  Patient have any upcoming invasive procedures/surgeries? No  Objective: CMP     Component Value Date/Time   NA 138 01/26/2022 1150   NA 141 07/15/2021 0833   K 4.5 01/26/2022 1150   CL 103 01/26/2022 1150   CO2 25 01/26/2022 1150   GLUCOSE 84 01/26/2022 1150   BUN 15 01/26/2022 1150   BUN 14 07/15/2021 0833   CREATININE 0.91 01/26/2022 1150   CALCIUM 9.7 01/26/2022 1150   PROT 8.0 01/26/2022 1150   PROT 7.7 07/15/2021 0833   ALBUMIN 4.8 07/15/2021 0833   AST 27 01/26/2022 1150   ALT 41 01/26/2022 1150   ALKPHOS 70 07/15/2021 0833   BILITOT 1.1 01/26/2022 1150   BILITOT 0.9 07/15/2021 0833   GFRNONAA 92 12/26/2018 0806   GFRAA 107 12/26/2018 0806    CBC    Component Value Date/Time   WBC 6.6 01/26/2022 1150   RBC 5.14 01/26/2022 1150   HGB 15.8 01/26/2022 1150   HGB 16.2 07/15/2021 0833   HCT 46.5 01/26/2022 1150   HCT 48.2 07/15/2021 0833   PLT 287 01/26/2022 1150   PLT 265 07/15/2021 0833   MCV 90.5 01/26/2022 1150   MCV 93 07/15/2021 0833   MCH 30.7 01/26/2022 1150   MCHC 34.0 01/26/2022 1150   RDW 12.4 01/26/2022 1150   RDW 12.5 07/15/2021 0833   LYMPHSABS 1,716 01/26/2022 1150   LYMPHSABS 2.0 07/15/2021 0833   EOSABS 92 01/26/2022 1150   EOSABS 0.2 07/15/2021 0833   BASOSABS 40 01/26/2022 1150   BASOSABS 0.0 07/15/2021 0833    Baseline Immunosuppressant Therapy Labs TB GOLD    Latest Ref Rng & Units 01/26/2022   11:50 AM  Quantiferon TB Gold  Quantiferon TB Gold Plus NEGATIVE NEGATIVE    Hepatitis  Panel    Latest Ref Rng & Units 01/26/2022   11:50 AM  Hepatitis  Hep B Surface Ag NON-REACTIVE NON-REACTIVE   Hep B IgM NON-REACTIVE NON-REACTIVE   Hep C Ab NON-REACTIVE NON-REACTIVE    HIV Lab Results  Component Value Date   HIV Non Reactive 12/26/2018   Immunoglobulins   SPEP    Latest Ref Rng & Units 01/26/2022   11:50 AM  Serum Protein Electrophoresis  Total Protein 6.1 - 8.1 g/dL 8.0    G6PD No results found for: "G6PDH" TPMT No results found for: "TPMT"   Chest x-ray: order placed today  Assessment/Plan:  Reviewed importance of holding Enbrel with signs/symptoms of an infections, if antibiotics are prescribed to treat an active infection, and with invasive procedures  Chest xray order placed today.  Demonstrated proper injection technique with Enbrel Mini and Autoinjector demo device  Patient able to demonstrate proper injection technique using the teach back method.  Patient self injected in the left lower abdomen with:  Sample Medication: Enbrel '50mg'$ /ml cartridge NDC: [address]-40 Lot: [id number] Expiration: 02/25  Sample Medication: Autotouch reusable injector NDC: [address]-08 Lot: [id number] Expiration:43026  Patient tolerated well.  Observed for 30 mins in office for adverse reaction. Patient denies irritation and itchiness at injection site. Provider notes  no redness or swelling at injection site.   Patient is to return in 1 month for labs and 6-8 weeks for follow-up appointment.  Standing orders placed.   Enbrel approved through insurance .   Rx sent to: [facility]: [phone] .  Patient provided with pharmacy phone number and advised that [facility] will call to schedule first shipment. Subsequent fills will be scheduled by [place] [facility] staff.  Patient will continue Enbrel '50mg'$  SQ every 7 days.  He does see dermatology clinic in [place]  All questions encouraged and answered.  Instructed patient to call with any  further questions or concerns.  [name], PharmD, MPH, BCPS, CPP Clinical Pharmacist (Rheumatology and Pulmonology)  02/17/2022 12:50 PM

## 2022-02-17 NOTE — Telephone Encounter (Signed)
Returned call to patient regarding Enbrel prior authorization.Advised that we are still waiting for response from insurance. He states that he called the insurance yesterday and was told it was approved through March 2024.  He is requesting pharmacy team reach out to insurance to help expedite the process.  Called [employer] for update.  Received verbal notification from [facility] regarding a prior authorization for ENBREL. Authorization has been APPROVED from 02/15/22 to 08/15/22.   Patient must fill through [place]: [phone]   Authorization # [id number]  Phone: [phone]  [name], PharmD, MPH, BCPS, CPP Clinical Pharmacist (Rheumatology and Pulmonology)

## 2022-02-17 NOTE — Telephone Encounter (Signed)
Received notification from [facility] regarding a prior authorization for ENBREL. Authorization has been APPROVED from 02/15/22 to 08/15/22.   Per test claim, copay for 28 days supply is $500  Patient must fill through [place]: [phone]   Authorization # [id number]  Phone: [phone]  Attempted to enroll patient into Enbrel copay card but portal states that patient is already enrolled. Called patient. Scheduled for Enbrel new start on 02/18/22 @ 8:30am. Patient will bring hs copay card information. Will use sample for first dose.  [name], PharmD, MPH, BCPS, CPP Clinical Pharmacist (Rheumatology and Pulmonology)

## 2022-02-18 ENCOUNTER — Ambulatory Visit: Payer: 59 | Attending: Internal Medicine | Admitting: Pharmacist

## 2022-02-18 ENCOUNTER — Other Ambulatory Visit (HOSPITAL_COMMUNITY): Payer: Self-pay

## 2022-02-18 DIAGNOSIS — L409 Psoriasis, unspecified: Secondary | ICD-10-CM

## 2022-02-18 DIAGNOSIS — Z79899 Other long term (current) drug therapy: Secondary | ICD-10-CM

## 2022-02-18 DIAGNOSIS — Z7189 Other specified counseling: Secondary | ICD-10-CM

## 2022-02-18 DIAGNOSIS — M452 Ankylosing spondylitis of cervical region: Secondary | ICD-10-CM

## 2022-02-18 MED ORDER — ENBREL MINI 50 MG/ML ~~LOC~~ SOCT
50.0000 mg | SUBCUTANEOUS | 0 refills | Status: DC
  Filled 2022-02-18: qty 12, fill #0
  Filled 2022-02-22: qty 4, 28d supply, fill #0
  Filled 2022-03-19: qty 4, 28d supply, fill #1
  Filled 2022-04-16: qty 4, 28d supply, fill #2

## 2022-02-18 NOTE — Telephone Encounter (Signed)
Copay card information that patient self-enrolled and provided:  ID: [id number] BIN: [id number] PCN: [id number] Group: [id number]  [name], PharmD, MPH, BCPS, CPP Clinical Pharmacist (Rheumatology and Pulmonology)

## 2022-02-18 NOTE — Patient Instructions (Addendum)
Your next ENBREL dose is due on 02/25/22, 03/04/22, and every 7 days thereafter  HOLD ENBREL if you have signs or symptoms of an infection. You can resume once you feel better or back to your baseline. HOLD ENBREL if you start antibiotics to treat an infection. HOLD ENBREL around the time of surgery/procedures. Your surgeon will be able to provide recommendations on when to hold BEFORE and when you are cleared to [place].  Pharmacy information: Your prescription will be shipped from [facility]. Their phone number is [phone] [facility] will call you to schedule the first shipment and confirm address. They will mail your medication to your home.  Labs are due in 1 month then every 3 months. Lab hours are from Monday to Thursday 1:30-4:30pm and Friday 1:30-4pm. You do not need an appointment if you come for labs during these times.  How to manage an injection site reaction: Remember the 5 C's: COUNTER - leave on the counter at least 30 minutes but up to overnight to bring medication to room temperature. This may help prevent stinging COLD - place something cold (like an ice gel pack or cold water bottle) on the injection site just before cleansing with alcohol. This may help reduce pain CLARITIN - use Claritin (generic name is loratadine) for the first two weeks of treatment or the day of, the day before, and the day after injecting. This will help to minimize injection site reactions CORTISONE CREAM - apply if injection site is irritated and itching CALL ME - if injection site reaction is bigger than the size of your fist, looks infected, blisters, or if you develop hives

## 2022-02-19 ENCOUNTER — Other Ambulatory Visit (HOSPITAL_COMMUNITY): Payer: Self-pay

## 2022-02-22 ENCOUNTER — Other Ambulatory Visit (HOSPITAL_COMMUNITY): Payer: Self-pay

## 2022-02-23 ENCOUNTER — Other Ambulatory Visit (HOSPITAL_COMMUNITY): Payer: Self-pay

## 2022-02-23 NOTE — Progress Notes (Signed)
Pt is getting over a cold and says he is near end of his symptoms. Pt want's to know if he will be ok to take his injection as scheduled on 9.21.23.

## 2022-02-23 NOTE — Progress Notes (Signed)
  Delivery instructions have been updated in [place], medication will be shipped to patient's home address by [employer].  Rx has been processed in [facility] and the patient has no copay at this time.

## 2022-02-23 NOTE — Progress Notes (Signed)
Spoke with patient. Advised that he can take Enbrel on Thursday, 02/25/22 only if his symptoms have fully resolved. If he has lingering symptoms, he should delay Enbrel dose until symptoms have resolved. He verbalized understanding  [name], PharmD, MPH, BCPS, CPP Clinical Pharmacist (Rheumatology and Pulmonology)

## 2022-02-24 ENCOUNTER — Other Ambulatory Visit (HOSPITAL_COMMUNITY): Payer: Self-pay

## 2022-02-25 DIAGNOSIS — F411 Generalized anxiety disorder: Secondary | ICD-10-CM | POA: Diagnosis not present

## 2022-03-02 ENCOUNTER — Ambulatory Visit: Payer: 59 | Admitting: Internal Medicine

## 2022-03-05 DIAGNOSIS — F411 Generalized anxiety disorder: Secondary | ICD-10-CM | POA: Diagnosis not present

## 2022-03-10 DIAGNOSIS — F411 Generalized anxiety disorder: Secondary | ICD-10-CM | POA: Diagnosis not present

## 2022-03-17 DIAGNOSIS — F411 Generalized anxiety disorder: Secondary | ICD-10-CM | POA: Diagnosis not present

## 2022-03-19 ENCOUNTER — Other Ambulatory Visit (HOSPITAL_COMMUNITY): Payer: Self-pay

## 2022-03-23 ENCOUNTER — Other Ambulatory Visit (HOSPITAL_COMMUNITY): Payer: Self-pay

## 2022-03-24 DIAGNOSIS — F411 Generalized anxiety disorder: Secondary | ICD-10-CM | POA: Diagnosis not present

## 2022-03-25 ENCOUNTER — Other Ambulatory Visit: Payer: Self-pay | Admitting: *Deleted

## 2022-03-25 DIAGNOSIS — Z79899 Other long term (current) drug therapy: Secondary | ICD-10-CM

## 2022-03-25 DIAGNOSIS — L409 Psoriasis, unspecified: Secondary | ICD-10-CM | POA: Diagnosis not present

## 2022-03-25 DIAGNOSIS — M452 Ankylosing spondylitis of cervical region: Secondary | ICD-10-CM | POA: Diagnosis not present

## 2022-03-26 LAB — COMPLETE METABOLIC PANEL WITH GFR
AG Ratio: 1.5 (calc) (ref 1.0–2.5)
ALT: 49 U/L — ABNORMAL HIGH (ref 9–46)
AST: 29 U/L (ref 10–40)
Albumin: 4.6 g/dL (ref 3.6–5.1)
Alkaline phosphatase (APISO): 65 U/L (ref 36–130)
BUN: 14 mg/dL (ref 7–25)
CO2: 28 mmol/L (ref 20–32)
Calcium: 9.5 mg/dL (ref 8.6–10.3)
Chloride: 104 mmol/L (ref 98–110)
Creat: 0.94 mg/dL (ref 0.60–1.26)
Globulin: 3.1 g/dL (calc) (ref 1.9–3.7)
Glucose, Bld: 72 mg/dL (ref 65–99)
Potassium: 4.3 mmol/L (ref 3.5–5.3)
Sodium: 140 mmol/L (ref 135–146)
Total Bilirubin: 0.9 mg/dL (ref 0.2–1.2)
Total Protein: 7.7 g/dL (ref 6.1–8.1)
eGFR: 107 mL/min/{1.73_m2} (ref >=60)

## 2022-03-26 LAB — CBC WITH DIFFERENTIAL/PLATELET
Absolute Monocytes: 389 cells/uL (ref 200–950)
Basophils Absolute: 32 cells/uL (ref 0–200)
Basophils Relative: 0.6 %
Eosinophils Absolute: 119 cells/uL (ref 15–500)
Eosinophils Relative: 2.2 %
HCT: 47.1 % (ref 38.5–50.0)
Hemoglobin: 16.2 g/dL (ref 13.2–17.1)
Lymphs Abs: 1890 cells/uL (ref 850–3900)
MCH: 31.3 pg (ref 27.0–33.0)
MCHC: 34.4 g/dL (ref 32.0–36.0)
MCV: 90.9 fL (ref 80.0–100.0)
MPV: 9.9 fL (ref 7.5–12.5)
Monocytes Relative: 7.2 %
Neutro Abs: 2970 cells/uL (ref 1500–7800)
Neutrophils Relative %: 55 %
Platelets: 225 10*3/uL (ref 140–400)
RBC: 5.18 10*6/uL (ref 4.20–5.80)
RDW: 13.1 % (ref 11.0–15.0)
Total Lymphocyte: 35 %
WBC: 5.4 10*3/uL (ref 3.8–10.8)

## 2022-03-31 DIAGNOSIS — F411 Generalized anxiety disorder: Secondary | ICD-10-CM | POA: Diagnosis not present

## 2022-04-02 NOTE — Progress Notes (Signed)
Lab results look fine for continuing the Enbrel. The ALT which is a liver enzyme is very slightly increased (49, normal is 9-46). I doubt this small of a difference is significant. We can just recheck it when following up in the future to see if there is any trend.

## 2022-04-07 DIAGNOSIS — F411 Generalized anxiety disorder: Secondary | ICD-10-CM | POA: Diagnosis not present

## 2022-04-14 ENCOUNTER — Other Ambulatory Visit (HOSPITAL_COMMUNITY): Payer: Self-pay

## 2022-04-14 DIAGNOSIS — F411 Generalized anxiety disorder: Secondary | ICD-10-CM | POA: Diagnosis not present

## 2022-04-16 ENCOUNTER — Other Ambulatory Visit (HOSPITAL_COMMUNITY): Payer: Self-pay

## 2022-04-21 ENCOUNTER — Other Ambulatory Visit (HOSPITAL_COMMUNITY): Payer: Self-pay

## 2022-04-21 DIAGNOSIS — F411 Generalized anxiety disorder: Secondary | ICD-10-CM | POA: Diagnosis not present

## 2022-04-23 ENCOUNTER — Other Ambulatory Visit (HOSPITAL_COMMUNITY): Payer: Self-pay

## 2022-04-26 DIAGNOSIS — F411 Generalized anxiety disorder: Secondary | ICD-10-CM | POA: Diagnosis not present

## 2022-05-03 DIAGNOSIS — F411 Generalized anxiety disorder: Secondary | ICD-10-CM | POA: Diagnosis not present

## 2022-05-04 NOTE — Progress Notes (Signed)
Office Visit Note  Patient: Luis Khan             Date of Birth: 1985-07-19           MRN: [id number]             PCP: de [place], Raymond J, MD Referring: de [place], Raymond J, MD Visit Date: 05/05/2022   Subjective:  Follow-up (Doing great)   History of Present Illness: Luis Khan is a 36 y.o. 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 He has seen a large improvement in symptoms started since about 2 or 3 weeks in and has done well since then. Morning stiffness is now minimal. Flexibility in hips and knees improved. Still taking aleve routine twice daily. Skin rashes are also largely but not completely better on scalp, arms, and hips.  Previous HPI 02/10/22 Luis Khan is a 36 y.o. male here for follow up for evaluation of recurrent left knee effusion and joint pains in shoulders, back, and hips. Labs at initial visit positive for HLA-B27 and elevated ESR and CRP.  Since her last visit he had a left knee steroid injection with Dr. Barbaraann Barthel with improvement in symptoms as he needs to be functional for an upcoming golf event this weekend.  Otherwise symptoms remain about the same he is still requiring maximal dose of naproxen to mostly control symptoms.   Previous HPI 01/26/2022 Luis Khan is a 36 y.o. male here for evaluation of recurrent left knee effusion also joint pains increased in multiple areas. He has been noticing new or increased symptoms since last summer with left knee effusion and restricted range of motion. He also had ongoing right knee pain with patellar tendonitis.  Imaging without significant OA changes and knee aspiration with moderate WBC increase otherwise bland. The knee effusion remained improved for months and he participated in PT for several months with a good benefit in his symptoms. However over time and especially since finishing PT symptoms are worsening again now including mid back pain, shoulders,  hips, and knee pains. His back pain is most severe at night and first thing in the mornings. He sees a large but incomplete improvement with taking 2 tablets of naproxen, most effective at night time to reduce morning symptoms. He had recurrence of left knee effusion and aspiration by Dr. Barbaraann Barthel with findings of elevated cell count both RBCs and WBCs 7398. Previous aspiration from a year ago with elevated WBCs 4067. He does have a history of skin psoriasis, referred to new dermatology last October for increase in symptoms and he is controlling this with topical clobetasol. He has chronic GI symptoms attributed to IBS and also history of celiac disease. Currently these symptoms are at about normal baseline. He was also seeing his eye doctor recent with recurrent styes or hordeolum, but no history of iritis or uveitis known.    Labs reviewed 02/2021 RF neg ANA neg ESR 4 CRP 5   Imaging reviewed 02/10/2021 MRI Left knee IMPRESSION: Focal mild bony edema within the medial aspect of the mid patella, near the MPFL/medial retinacular attachment. The MPFL/medial retinaculum appear intact. This could represent a focal bony contusion or traction injury.   Moderate-sized joint effusion.   No evidence of meniscus tear.   Review of Systems  Constitutional:  Negative for fatigue.  HENT:  Negative for mouth sores and mouth dryness.   Eyes:  Negative for dryness.  Respiratory:  Negative for shortness of breath.   Cardiovascular:  Negative for chest pain and palpitations.  Gastrointestinal:  Negative for blood in stool, constipation and diarrhea.  Endocrine: Negative for increased urination.  Genitourinary:  Negative for involuntary urination.  Musculoskeletal:  Negative for joint pain, gait problem, joint pain, joint swelling, myalgias, muscle weakness, morning stiffness, muscle tenderness and myalgias.  Skin:  Negative for color change, rash, hair loss and sensitivity to sunlight.   Allergic/Immunologic: Positive for susceptible to infections.  Neurological:  Negative for dizziness and headaches.  Hematological:  Negative for swollen glands.  Psychiatric/Behavioral:  Negative for depressed mood and sleep disturbance. The patient is not nervous/anxious.     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/04/2021   Knee pain, chronic 03/04/2021   Sudden visual loss, right eye 02/17/2021   Hordeolum externum of right upper eyelid 07/19/2018   Gastroesophageal reflux disease 04/09/2015    Past Medical History:  Diagnosis Date   Allergy    Anxiety    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 ([place]) 02/10/2022   Blood transfusion without reported diagnosis    Chronic allergic rhinitis    Flat feet, bilateral    GERD (gastroesophageal reflux disease) 10/03/2014   NASH (nonalcoholic steatohepatitis)    Patellar tendinitis    Right Knee   Premature labor, onset of delivery before 37 weeks of gestation, fetus 5 07/09/2015   Psoriasis 09/2814    Family History  Problem Relation Age of Onset   Irritable bowel syndrome Mother    Kidney Stones Mother    Hypertension Father    Gallstones Father    Hyperlipidemia Father    Gallbladder disease Maternal Aunt    Kidney cancer Maternal Aunt    Diabetes Mellitus II Maternal Uncle    Diabetes Maternal Uncle    Pancreatic cancer Maternal Grandmother    Pancreatic disease Maternal Grandmother    Cancer Maternal Grandmother    Dementia Maternal Grandfather    Heart attack Maternal Grandfather    Gallstones Paternal Grandmother    Drug abuse  Paternal Grandmother    Hypertension Paternal Grandmother    Kidney Stones Paternal Grandmother    Congestive Heart Failure Paternal Grandfather    Coronary artery disease Paternal Grandfather    Heart attack Paternal Grandfather    Diabetes Paternal Grandfather    Healthy Daughter    Past Surgical History:  Procedure Laterality Date   CHOLECYSTECTOMY N/A 07/23/2016   Procedure: LAPAROSCOPIC CHOLECYSTECTOMY WITH INTRAOPERATIVE CHOLANGIOGRAM;  Surgeon: Leonie Green, MD;  Location: [facility] ORS;  Service: General;  Laterality: N/A;   ESOPHAGOGASTRODUODENOSCOPY (EGD) WITH PROPOFOL N/A 08/01/2015   Procedure: ESOPHAGOGASTRODUODENOSCOPY (EGD) WITH PROPOFOL;  Surgeon: Lollie Sails, MD;  Location: [facility] ENDOSCOPY;  Service: Endoscopy;  Laterality: N/A;   WISDOM TOOTH EXTRACTION     Social History   Social History Narrative   Not on file   Immunization History  Administered Date(s) Administered   Influenza,inj,Quad PF,6+ Mos 04/03/2019   Influenza-Unspecified 03/02/2018, 02/27/2020, 04/03/2021   Tdap 01/31/2018     Objective: Vital Signs: BP (!) 134/90 (BP Location: Left Arm, Patient Position: Sitting, Cuff Size: Normal)   Pulse 88  Resp 15   Ht 6' 4.5" (1.943 m)   Wt 254 lb (115.2 kg)   BMI 30.52 kg/m    Physical Exam Eyes:     Conjunctiva/sclera: Conjunctivae normal.  Cardiovascular:     Rate and Rhythm: Normal rate and regular rhythm.  Pulmonary:     Effort: Pulmonary effort is normal.     Breath sounds: Normal breath sounds.  Musculoskeletal:     Right lower leg: No edema.     Left lower leg: No edema.  Skin:    General: Skin is warm and dry.     Findings: Rash present.     Comments: Flat erythematous spots behind ears no overlying scale, few on forearm extensor surfaces 31m diameter or less  Neurological:     Mental Status: He is alert.  Psychiatric:        Mood and Affect: Mood normal.      Musculoskeletal Exam:  Shoulders full ROM no tenderness or  swelling Elbows full ROM no tenderness or swelling Wrists full ROM no tenderness or swelling Fingers full ROM no tenderness or swelling No paraspinal tenderness to palpation over upper and lower back Left hip FABER maneuver slightly restricted compared to right but not painful, no tenderness to lateral hip palpation Knees full ROM no tenderness or swelling Ankles full ROM no tenderness or swelling   Investigation: No additional findings.  Imaging: No results found.  Recent Labs: Lab Results  Component Value Date   WBC 5.4 03/25/2022   HGB 16.2 03/25/2022   PLT 225 03/25/2022   NA 140 03/25/2022   K 4.3 03/25/2022   CL 104 03/25/2022   CO2 28 03/25/2022   GLUCOSE 72 03/25/2022   BUN 14 03/25/2022   CREATININE 0.94 03/25/2022   BILITOT 0.9 03/25/2022   ALKPHOS 70 07/15/2021   AST 29 03/25/2022   ALT 49 (H) 03/25/2022   PROT 7.7 03/25/2022   ALBUMIN 4.8 07/15/2021   CALCIUM 9.5 03/25/2022   GFRAA 107 12/26/2018   QFTBGOLDPLUS NEGATIVE 01/26/2022    Speciality Comments: No specialty comments available.  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  Will recheck CRP for disease activity monitoring as his was previously elevated.  Will plan to continue the Enbrel 50 mg subcu weekly.  I recommend he can try decreasing his maintenance naproxen initially try going down to 1 daily with 2 only as needed or could try going to entirely just as needed if symptoms are doing well.  High risk medication use - Plan: CBC with Differential/Platelet, COMPLETE METABOLIC PANEL WITH GFR  Checking CBC and CMP for medication monitoring after new Enbrel started and continuing  naproxen.  Has not experienced any significant injection site reactions or intolerance.  Psoriasis  Skin disease is partially improved there are still some flat erythematous spots on the scalp but without scaling and I did not visually inspect but he reports some improving on the lateral hip rashes.  Orders: Orders Placed This Encounter  Procedures   C-reactive protein   CBC with Differential/Platelet   COMPLETE METABOLIC PANEL WITH GFR   No orders of the defined types were placed in this encounter.    Follow-Up Instructions: Return in about 3 months (around 08/05/2022) for AS/PsA on  ENB/NSAIDs f/u 31mo.   CCollier Salina MD  Note - This record has been created using [employer]  Chart creation errors have been sought, but may not always  have been located. Such creation errors do not reflect on  the standard of medical care.

## 2022-05-05 ENCOUNTER — Encounter: Payer: Self-pay | Admitting: Internal Medicine

## 2022-05-05 ENCOUNTER — Ambulatory Visit: Payer: 59 | Attending: Internal Medicine | Admitting: Internal Medicine

## 2022-05-05 VITALS — BP 134/94 | HR 65 | Resp 15 | Ht 76.5 in | Wt 254.0 lb

## 2022-05-05 DIAGNOSIS — M452 Ankylosing spondylitis of cervical region: Secondary | ICD-10-CM | POA: Diagnosis not present

## 2022-05-05 DIAGNOSIS — L409 Psoriasis, unspecified: Secondary | ICD-10-CM

## 2022-05-05 DIAGNOSIS — Z79899 Other long term (current) drug therapy: Secondary | ICD-10-CM | POA: Diagnosis not present

## 2022-05-06 ENCOUNTER — Other Ambulatory Visit (HOSPITAL_COMMUNITY): Payer: Self-pay

## 2022-05-06 DIAGNOSIS — L821 Other seborrheic keratosis: Secondary | ICD-10-CM | POA: Diagnosis not present

## 2022-05-06 DIAGNOSIS — L814 Other melanin hyperpigmentation: Secondary | ICD-10-CM | POA: Diagnosis not present

## 2022-05-06 DIAGNOSIS — L400 Psoriasis vulgaris: Secondary | ICD-10-CM | POA: Diagnosis not present

## 2022-05-06 DIAGNOSIS — D1801 Hemangioma of skin and subcutaneous tissue: Secondary | ICD-10-CM | POA: Diagnosis not present

## 2022-05-06 MED ORDER — CLOBETASOL PROPIONATE 0.05 % EX SOLN
Freq: Two times a day (BID) | CUTANEOUS | 4 refills | Status: DC | PRN
  Filled 2022-05-06: qty 50, 30d supply, fill #0

## 2022-05-06 MED ORDER — CLOBETASOL PROPIONATE 0.05 % EX CREA
TOPICAL_CREAM | Freq: Two times a day (BID) | CUTANEOUS | 3 refills | Status: DC
  Filled 2022-05-06: qty 60, 28d supply, fill #0

## 2022-05-09 ENCOUNTER — Telehealth: Payer: 59 | Admitting: Physician Assistant

## 2022-05-09 DIAGNOSIS — B9689 Other specified bacterial agents as the cause of diseases classified elsewhere: Secondary | ICD-10-CM

## 2022-05-09 DIAGNOSIS — J019 Acute sinusitis, unspecified: Secondary | ICD-10-CM | POA: Diagnosis not present

## 2022-05-10 ENCOUNTER — Other Ambulatory Visit (HOSPITAL_COMMUNITY): Payer: Self-pay

## 2022-05-10 MED ORDER — DOXYCYCLINE HYCLATE 100 MG PO TABS
100.0000 mg | ORAL_TABLET | Freq: Two times a day (BID) | ORAL | 0 refills | Status: DC
  Filled 2022-05-10: qty 20, 10d supply, fill #0

## 2022-05-10 NOTE — Progress Notes (Signed)
E-Visit for Sinus Problems  We are sorry that you are not feeling well.  Here is how we plan to help!  Based on what you have shared with me it looks like you have sinusitis.  Sinusitis is inflammation and infection in the sinus cavities of the head.  Based on your presentation I believe you most likely have Acute Bacterial Sinusitis.  This is an infection caused by bacteria and is treated with antibiotics. I have prescribed Doxycycline 100mg by mouth twice a day for 10 days. You may use an oral decongestant such as Mucinex D or if you have glaucoma or high blood pressure use plain Mucinex. Saline nasal spray help and can safely be used as often as needed for congestion.  If you develop worsening sinus pain, fever or notice severe headache and vision changes, or if symptoms are not better after completion of antibiotic, please schedule an appointment with a health care provider.    Sinus infections are not as easily transmitted as other respiratory infection, however we still recommend that you avoid close contact with loved ones, especially the very young and elderly.  Remember to wash your hands thoroughly throughout the day as this is the number one way to prevent the spread of infection!  Home Care: Only take medications as instructed by your medical team. Complete the entire course of an antibiotic. Do not take these medications with alcohol. A steam or ultrasonic humidifier can help congestion.  You can place a towel over your head and breathe in the steam from hot water coming from a faucet. Avoid close contacts especially the very young and the elderly. Cover your mouth when you cough or sneeze. Always remember to wash your hands.  Get Help Right Away If: You develop worsening fever or sinus pain. You develop a severe head ache or visual changes. Your symptoms persist after you have completed your treatment plan.  Make sure you Understand these instructions. Will watch your  condition. Will get help right away if you are not doing well or get worse.  Thank you for choosing an e-visit.  Your e-visit answers were reviewed by a board certified advanced clinical practitioner to complete your personal care plan. Depending upon the condition, your plan could have included both over the counter or prescription medications.  Please review your pharmacy choice. Make sure the pharmacy is open so you can pick up prescription now. If there is a problem, you may contact your provider through MyChart messaging and have the prescription routed to another pharmacy.  Your safety is important to us. If you have drug allergies check your prescription carefully.   For the next 24 hours you can use MyChart to ask questions about today's visit, request a non-urgent call back, or ask for a work or school excuse. You will get an email in the next two days asking about your experience. I hope that your e-visit has been valuable and will speed your recovery.  I have spent 5 minutes in review of e-visit questionnaire, review and updating patient chart, medical decision making and response to patient.   Zaydrian Batta M Larrie Lucia, PA-C  

## 2022-05-12 ENCOUNTER — Other Ambulatory Visit (HOSPITAL_COMMUNITY): Payer: Self-pay

## 2022-05-12 DIAGNOSIS — F411 Generalized anxiety disorder: Secondary | ICD-10-CM | POA: Diagnosis not present

## 2022-05-13 ENCOUNTER — Other Ambulatory Visit: Payer: Self-pay | Admitting: Internal Medicine

## 2022-05-13 ENCOUNTER — Other Ambulatory Visit (HOSPITAL_COMMUNITY): Payer: Self-pay

## 2022-05-13 DIAGNOSIS — M452 Ankylosing spondylitis of cervical region: Secondary | ICD-10-CM

## 2022-05-13 DIAGNOSIS — L409 Psoriasis, unspecified: Secondary | ICD-10-CM

## 2022-05-13 DIAGNOSIS — Z79899 Other long term (current) drug therapy: Secondary | ICD-10-CM

## 2022-05-13 MED ORDER — ENBREL MINI 50 MG/ML ~~LOC~~ SOCT
50.0000 mg | SUBCUTANEOUS | 0 refills | Status: DC
  Filled 2022-05-17: qty 12, 84d supply, fill #0
  Filled 2022-05-21: qty 4, 28d supply, fill #0
  Filled 2022-06-16: qty 4, 28d supply, fill #1
  Filled 2022-07-21: qty 4, 28d supply, fill #2

## 2022-05-13 NOTE — Telephone Encounter (Signed)
Next Visit: 08/10/2022  Last Visit: 05/05/2022  Last Fill: 02/18/2022  PL:WUZRV spondyloarthritis with involvement of peripheral joint   Current Dose per office note on 05/05/2022: Enbrel 50 mg [place] weekly.   Labs: 03/25/2022 Lab results look fine for continuing the Enbrel. The ALT which is a liver enzyme is very slightly increased (49, normal is 9-46). I doubt this small of a difference is significant. We can just recheck it when following up in the future to see if there is any trend.   TB Gold: 01/26/2022 negative    Okay to refill enbrel?

## 2022-05-14 ENCOUNTER — Other Ambulatory Visit (HOSPITAL_COMMUNITY): Payer: Self-pay

## 2022-05-17 ENCOUNTER — Other Ambulatory Visit (HOSPITAL_COMMUNITY): Payer: Self-pay

## 2022-05-17 ENCOUNTER — Encounter (HOSPITAL_BASED_OUTPATIENT_CLINIC_OR_DEPARTMENT_OTHER): Payer: Self-pay | Admitting: Family Medicine

## 2022-05-17 ENCOUNTER — Ambulatory Visit (HOSPITAL_BASED_OUTPATIENT_CLINIC_OR_DEPARTMENT_OTHER): Payer: 59 | Admitting: Family Medicine

## 2022-05-17 VITALS — BP 132/100 | HR 75 | Ht 76.5 in | Wt 250.3 lb

## 2022-05-17 DIAGNOSIS — R053 Chronic cough: Secondary | ICD-10-CM | POA: Insufficient documentation

## 2022-05-17 DIAGNOSIS — R051 Acute cough: Secondary | ICD-10-CM | POA: Diagnosis not present

## 2022-05-17 DIAGNOSIS — R059 Cough, unspecified: Secondary | ICD-10-CM | POA: Insufficient documentation

## 2022-05-17 DIAGNOSIS — F411 Generalized anxiety disorder: Secondary | ICD-10-CM | POA: Diagnosis not present

## 2022-05-17 MED ORDER — BENZONATATE 200 MG PO CAPS
200.0000 mg | ORAL_CAPSULE | Freq: Three times a day (TID) | ORAL | 0 refills | Status: DC | PRN
  Filled 2022-05-17: qty 45, 15d supply, fill #0

## 2022-05-17 NOTE — Assessment & Plan Note (Signed)
Patient reports that the weekend after Thanksgiving, he began to experience symptoms including sinus congestion, cough, fatigue.  He initially started with symptomatic management.  Symptoms did persist and he did have a virtual visit completed.  At that time, he was started on antibiotic therapy with doxycycline, partially due to underlying allergies to Augmentin, Keflex.  He has been taking doxycycline as prescribed and has nearly completed medication.  He presents today due to persistent symptoms, primarily related to cough.  He does also have mild sinus congestion at this time.  Primary concerns are related to continued symptoms.  He denies having any recent fever, chills, sweats.  He did feel that he was improving, however feels that he has plateaued in recent days. On exam, patient is in no acute distress, vital signs stable, patient is afebrile.  Cardiovascular exam with regular rate and rhythm, lungs clear to auscultation bilaterally.  Mild pharyngeal erythema present, no significant cervical lymphadenopathy After reviewing history and exam, feel that patient generally is doing well from an acute infection standpoint and feel that his current symptoms, notably cough, are likely related to postviral cough syndrome.  Doubt pneumonia given normal auscultation on exam.  At this time, would recommend proceeding with conservative measures to help with controlling cough.  Discussed that expectation is for this to gradually improve and resolve over the coming 1 to 2 weeks. Prescription sent to pharmacy for [facility].  Also can utilize OTC dextromethorphan as well as honey.   If symptoms do persist, recommend returning to the office for further evaluation, possible chest x-ray

## 2022-05-17 NOTE — Progress Notes (Signed)
    Procedures performed today:    None.  Independent interpretation of notes and tests performed by another provider:   None.  Brief History, Exam, Impression, and Recommendations:    BP (!) 132/100 (BP Location: Left Arm, Patient Position: Sitting, Cuff Size: Large)   Pulse 75   Ht 6' 4.5" (1.943 m)   Wt 250 lb 4.8 oz (113.5 kg)   SpO2 100%   BMI 30.07 kg/m   Cough Patient reports that the weekend after Thanksgiving, he began to experience symptoms including sinus congestion, cough, fatigue.  He initially started with symptomatic management.  Symptoms did persist and he did have a virtual visit completed.  At that time, he was started on antibiotic therapy with doxycycline, partially due to underlying allergies to Augmentin, Keflex.  He has been taking doxycycline as prescribed and has nearly completed medication.  He presents today due to persistent symptoms, primarily related to cough.  He does also have mild sinus congestion at this time.  Primary concerns are related to continued symptoms.  He denies having any recent fever, chills, sweats.  He did feel that he was improving, however feels that he has plateaued in recent days. On exam, patient is in no acute distress, vital signs stable, patient is afebrile.  Cardiovascular exam with regular rate and rhythm, lungs clear to auscultation bilaterally.  Mild pharyngeal erythema present, no significant cervical lymphadenopathy After reviewing history and exam, feel that patient generally is doing well from an acute infection standpoint and feel that his current symptoms, notably cough, are likely related to postviral cough syndrome.  Doubt pneumonia given normal auscultation on exam.  At this time, would recommend proceeding with conservative measures to help with controlling cough.  Discussed that expectation is for this to gradually improve and resolve over the coming 1 to 2 weeks. Prescription sent to pharmacy for [facility].  Also  can utilize OTC dextromethorphan as well as honey.   If symptoms do persist, recommend returning to the office for further evaluation, possible chest x-ray   ___________________________________________ Luis Khan de [place], MD, ABFM, CAQSM Primary Care and [place]

## 2022-05-21 ENCOUNTER — Other Ambulatory Visit (HOSPITAL_COMMUNITY): Payer: Self-pay

## 2022-05-21 ENCOUNTER — Other Ambulatory Visit: Payer: Self-pay

## 2022-05-24 ENCOUNTER — Other Ambulatory Visit: Payer: Self-pay

## 2022-05-24 ENCOUNTER — Other Ambulatory Visit (HOSPITAL_COMMUNITY): Payer: Self-pay

## 2022-05-24 ENCOUNTER — Other Ambulatory Visit (HOSPITAL_BASED_OUTPATIENT_CLINIC_OR_DEPARTMENT_OTHER): Payer: Self-pay | Admitting: Family Medicine

## 2022-05-24 DIAGNOSIS — K219 Gastro-esophageal reflux disease without esophagitis: Secondary | ICD-10-CM

## 2022-05-24 DIAGNOSIS — F411 Generalized anxiety disorder: Secondary | ICD-10-CM | POA: Diagnosis not present

## 2022-05-24 MED ORDER — PANTOPRAZOLE SODIUM 40 MG PO TBEC
40.0000 mg | DELAYED_RELEASE_TABLET | Freq: Every day | ORAL | 1 refills | Status: DC
  Filled 2022-06-01: qty 90, 90d supply, fill #0
  Filled 2022-08-27: qty 90, 90d supply, fill #1

## 2022-06-01 ENCOUNTER — Other Ambulatory Visit (HOSPITAL_COMMUNITY): Payer: Self-pay

## 2022-06-01 ENCOUNTER — Other Ambulatory Visit: Payer: Self-pay

## 2022-06-02 ENCOUNTER — Ambulatory Visit (HOSPITAL_BASED_OUTPATIENT_CLINIC_OR_DEPARTMENT_OTHER): Payer: 59 | Admitting: Family Medicine

## 2022-06-02 ENCOUNTER — Encounter (HOSPITAL_BASED_OUTPATIENT_CLINIC_OR_DEPARTMENT_OTHER): Payer: Self-pay | Admitting: Family Medicine

## 2022-06-02 ENCOUNTER — Other Ambulatory Visit (HOSPITAL_COMMUNITY): Payer: Self-pay

## 2022-06-02 DIAGNOSIS — J019 Acute sinusitis, unspecified: Secondary | ICD-10-CM

## 2022-06-02 MED ORDER — LEVOFLOXACIN 500 MG PO TABS
500.0000 mg | ORAL_TABLET | Freq: Every day | ORAL | 0 refills | Status: AC
  Filled 2022-06-02: qty 7, 7d supply, fill #0

## 2022-06-02 NOTE — Progress Notes (Signed)
    Procedures performed today:    None.  Independent interpretation of notes and tests performed by another provider:   None.  Brief History, Exam, Impression, and Recommendations:    BP (!) 142/92 (BP Location: Right Arm, Patient Position: Sitting, Cuff Size: Large)   Pulse 76   Ht 6' 4.5" (1.943 m)   Wt 251 lb 4.8 oz (114 kg)   SpO2 100%   BMI 30.19 kg/m   Acute sinusitis, unspecified Patient presents with continued symptoms of cough, sinus congestion, sinus headache.  Patient was previously seen with similar symptoms about 2 weeks ago.  He reports that he did have some improvement after appointment, however over the past several days, symptoms have again worsened for him.  He has continued with intranasal steroid spray, nasal saline irrigation.  He has been using Afrin intermittently as well, has used for about 5 days consecutively most recently.  Has not had any significant shortness of breath.  He most recently had treatment course with doxycycline, completed 10-day course of this antibiotic.  He has had issues in the past with Augmentin as well as cephalosporin. On exam, patient is in no acute distress, vital signs stable, patient is afebrile.  Cardiovascular exam with regular rate and rhythm, lungs clear to auscultation bilaterally. Based on history and exam, feel that patient has had incomplete resolution of acute bacterial sinusitis.  Unfortunately, given previously used antibiotics as well as reported allergies to alternative regimens, next choice for antibiotic alternative would be levofloxacin.  We discussed this antibiotic as well as risk for serious complications including C. difficile colitis, tendon rupture.  Patient voiced understanding.  We will treat with 7-day course of antibiotic.  Recommend that patient continue with conservative measures.  Given duration of use for Afrin, would recommend discontinuing this for now Will plan for follow-up as needed.  Discussed that if  he does have any issues with medication or if symptoms do persist, to let us know and we can place referral to ENT   ___________________________________________ Junior Huezo de [place], MD, ABFM, [facility] and [place]

## 2022-06-02 NOTE — Assessment & Plan Note (Signed)
Patient presents with continued symptoms of cough, sinus congestion, sinus headache.  Patient was previously seen with similar symptoms about 2 weeks ago.  He reports that he did have some improvement after appointment, however over the past several days, symptoms have again worsened for him.  He has continued with intranasal steroid spray, nasal saline irrigation.  He has been using Afrin intermittently as well, has used for about 5 days consecutively most recently.  Has not had any significant shortness of breath.  He most recently had treatment course with doxycycline, completed 10-day course of this antibiotic.  He has had issues in the past with Augmentin as well as cephalosporin. On exam, patient is in no acute distress, vital signs stable, patient is afebrile.  Cardiovascular exam with regular rate and rhythm, lungs clear to auscultation bilaterally. Based on history and exam, feel that patient has had incomplete resolution of acute bacterial sinusitis.  Unfortunately, given previously used antibiotics as well as reported allergies to alternative regimens, next choice for antibiotic alternative would be levofloxacin.  We discussed this antibiotic as well as risk for serious complications including C. difficile colitis, tendon rupture.  Patient voiced understanding.  We will treat with 7-day course of antibiotic.  Recommend that patient continue with conservative measures.  Given duration of use for Afrin, would recommend discontinuing this for now Will plan for follow-up as needed.  Discussed that if he does have any issues with medication or if symptoms do persist, to let us know and we can place referral to ENT

## 2022-06-09 DIAGNOSIS — F411 Generalized anxiety disorder: Secondary | ICD-10-CM | POA: Diagnosis not present

## 2022-06-16 ENCOUNTER — Other Ambulatory Visit (HOSPITAL_COMMUNITY): Payer: Self-pay

## 2022-06-16 DIAGNOSIS — F411 Generalized anxiety disorder: Secondary | ICD-10-CM | POA: Diagnosis not present

## 2022-06-24 DIAGNOSIS — F411 Generalized anxiety disorder: Secondary | ICD-10-CM | POA: Diagnosis not present

## 2022-06-25 ENCOUNTER — Other Ambulatory Visit (HOSPITAL_COMMUNITY): Payer: Self-pay

## 2022-06-28 ENCOUNTER — Encounter: Payer: Self-pay | Admitting: Family Medicine

## 2022-07-05 ENCOUNTER — Ambulatory Visit (INDEPENDENT_AMBULATORY_CARE_PROVIDER_SITE_OTHER): Payer: Commercial Managed Care - PPO | Admitting: Family Medicine

## 2022-07-05 ENCOUNTER — Encounter: Payer: Self-pay | Admitting: Family Medicine

## 2022-07-05 VITALS — BP 128/86 | Ht 77.0 in | Wt 240.0 lb

## 2022-07-05 DIAGNOSIS — R269 Unspecified abnormalities of gait and mobility: Secondary | ICD-10-CM

## 2022-07-05 NOTE — Progress Notes (Signed)
Patient returns today for new inserts as his are very worn.  Sports insoles provided today with medium metatarsal pads.  On right side lateral heel wedge and posting placed similar to his previous inserts.  Felt comfortable standing and ambulating in hall.  More outturning right foot with walking gait.

## 2022-07-07 DIAGNOSIS — F411 Generalized anxiety disorder: Secondary | ICD-10-CM | POA: Diagnosis not present

## 2022-07-14 DIAGNOSIS — F411 Generalized anxiety disorder: Secondary | ICD-10-CM | POA: Diagnosis not present

## 2022-07-16 ENCOUNTER — Encounter (HOSPITAL_BASED_OUTPATIENT_CLINIC_OR_DEPARTMENT_OTHER): Payer: Self-pay

## 2022-07-21 ENCOUNTER — Other Ambulatory Visit (HOSPITAL_COMMUNITY): Payer: Self-pay

## 2022-07-22 ENCOUNTER — Ambulatory Visit (INDEPENDENT_AMBULATORY_CARE_PROVIDER_SITE_OTHER): Payer: Commercial Managed Care - PPO | Admitting: Family Medicine

## 2022-07-22 ENCOUNTER — Encounter (HOSPITAL_BASED_OUTPATIENT_CLINIC_OR_DEPARTMENT_OTHER): Payer: Self-pay | Admitting: Family Medicine

## 2022-07-22 VITALS — BP 143/110 | HR 69 | Temp 97.7°F | Ht 77.0 in | Wt 254.4 lb

## 2022-07-22 DIAGNOSIS — R030 Elevated blood-pressure reading, without diagnosis of hypertension: Secondary | ICD-10-CM | POA: Diagnosis not present

## 2022-07-22 DIAGNOSIS — Z0000 Encounter for general adult medical examination without abnormal findings: Secondary | ICD-10-CM | POA: Diagnosis not present

## 2022-07-22 NOTE — Patient Instructions (Signed)
Take blood pressure 3-4 times per week and keep log.

## 2022-07-22 NOTE — Progress Notes (Signed)
Complete physical exam  Patient: Luis Khan   DOB: [birth year]   37 y.o. Male  MRN: [id number]  Subjective:    Chief Complaint  Patient presents with   Annual Exam    Pt here for annual exam     Luis Khan is a 37 y.o. male who presents today for a complete physical exam. He reports consuming a general diet.  Has not been participating in formal exercise, walks at work.   He generally feels well. He reports sleeping well. He does not have additional problems to discuss today.   Sees Dr. Benjamine Mola, rheumatology will get blood work due in early March, will not get today.   Most recent fall risk assessment:    07/22/2022    8:43 AM  Fall Risk   Falls in the past year? 0  Number falls in past yr: 0  Injury with Fall? 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 ([place]) 02/10/2022   Blood transfusion without reported diagnosis    Chronic allergic rhinitis    Flat feet, bilateral    GERD (gastroesophageal reflux disease) 10/03/2014   NASH (nonalcoholic steatohepatitis)    Patellar tendinitis    Right Knee   Premature labor, onset of delivery before 37 weeks of gestation, fetus 5 07/09/2015   Psoriasis 09/2814      Patient Care Team: de [place], Blondell Reveal, MD as PCP - General (Family Medicine)   Outpatient Medications Prior to Visit  Medication Sig   clobetasol (TEMOVATE) 0.05 % external solution Apply topically 2 (two) times daily to affected area as needed.   clobetasol (TEMOVATE) 0.05 % external solution Apply  topically to damp scalp 1-2 times daily as needed for flares   doxycycline (MONODOX) 50 MG capsule Take 1 capsule (50 mg total) by mouth daily. (Patient taking differently: Take 50 mg by mouth every other day.)   Etanercept (ENBREL MINI) 50 MG/ML SOCT Inject 50 mg into the skin once a week.   fexofenadine (ALLEGRA) 180 MG tablet Take 180 mg by mouth every morning.    fluticasone (FLONASE) 50 MCG/ACT nasal spray Place 1 spray into both nostrils 2 (two) times daily.   pantoprazole (PROTONIX) 40 MG tablet Take 1 tablet (40 mg total) by mouth daily.   [DISCONTINUED] benzonatate (TESSALON) 200 MG capsule Take 1 capsule (200 mg total) by mouth 3 (three) times daily as needed for cough. (Patient not taking: Reported on 06/02/2022)   [DISCONTINUED] naproxen sodium (ALEVE) 220 MG tablet Take 440 mg by mouth daily.   No facility-administered medications prior to visit.    Review of Systems  Constitutional:  Negative for chills and fever.  Eyes:  Negative for blurred vision and double vision.  Respiratory:  Negative for shortness of breath.   Cardiovascular:  Negative for chest pain.  Gastrointestinal:  Negative for abdominal pain, nausea and vomiting.  Neurological:  Negative for dizziness and headaches.  Psychiatric/Behavioral:  Negative for depression and suicidal ideas.           Objective:     BP (!) 143/110   Pulse 69   Temp 97.7 F (36.5 C) (Oral)   Ht 6' 5"$  (1.956 m)   Wt 254 lb 6.4 oz (115.4 kg)   SpO2 99%   BMI 30.17 kg/m  BP Readings from Last 3 Encounters:  07/22/22 (!) 143/110  07/05/22 128/86  06/02/22 (!) 142/92      Physical Exam Vitals and nursing note reviewed.  Constitutional:      General: He is not in acute distress.    Appearance: Normal appearance.  HENT:     Right Ear: Tympanic membrane normal.     Left Ear: Tympanic membrane normal.     Nose: Nose normal.     Mouth/Throat:     Mouth: Mucous membranes are moist.     Pharynx: Oropharynx is clear.      Tonsils: No tonsillar exudate or tonsillar abscesses. 1+ on the right. 1+ on the left.     Comments: Chronically enlarged tonsils Cardiovascular:     Rate and Rhythm: Normal rate and regular rhythm.     Pulses: Normal pulses.     Heart sounds: Normal heart sounds.  Pulmonary:     Effort: Pulmonary effort is normal.     Breath sounds: Normal breath sounds.  Musculoskeletal:        General: Normal range of motion.     Cervical back: Normal range of motion.  Lymphadenopathy:     Cervical:     Right cervical: No superficial cervical adenopathy.    Left cervical: No superficial cervical adenopathy.  Skin:    General: Skin is warm and dry.     Capillary Refill: Capillary refill takes less than 2 seconds.  Neurological:     General: No focal deficit present.     Mental Status: He is alert. Mental status is at baseline.  Psychiatric:        Mood and Affect: Mood normal.        Behavior: Behavior normal.        Thought Content: Thought content normal.        Judgment: Judgment normal.      No results found for any visits on 07/22/22.     Assessment & Plan:    Routine Health Maintenance and Physical Exam  Immunization History  Administered Date(s) Administered   Influenza,inj,Quad PF,6+ Mos 04/03/2019, 03/21/2022   Influenza-Unspecified 03/02/2018, 02/27/2020, 04/03/2021   Tdap 01/31/2018    Health Maintenance  Topic Date Due   COVID-19 Vaccine (1) 08/06/2022 (Originally 07/25/1990)   DTaP/Tdap/Td (2 - Td or Tdap) 02/01/2028   INFLUENZA VACCINE  Completed   Hepatitis C Screening  Completed   HIV Screening  Completed   HPV VACCINES  Aged Out    Discussed health benefits of physical activity, and encouraged him to engage in regular exercise appropriate for his age and condition.  Problem List Items Addressed This Visit     Annual physical exam - Primary   Elevated blood pressure reading in office without diagnosis of hypertension    Blood pressure elevated in the  office today. He will watch sodium in diet, start a moderate exercise program, take blood pressure at home and keep log. Return in 3 weeks with BP log to assess need for medication per PCP.      Agrees with plan of care discussed.  Questions answered. Return in 3 weeks  with blood pressure log. Proper technique reviewed.   Routine labs per rheumatology in March.  HCM reviewed/discussed. Anticipatory guidance regarding healthy weight, lifestyle and choices given. Recommend healthy diet.  Recommend approximately 150 minutes/week of moderate intensity exercise. Limit alcohol consumption: no more than one drink per day for women and 2 drinks per day for me. Recommend regular dental and vision exams. Always use seatbelt/lap and shoulder restraints. Recommend using smoke alarms and checking batteries at least twice a year. Recommend using sunscreen when outside. Received seasonal influenza vaccine.   Return in about 3 weeks (around 08/12/2022) for for blood pressure .     Chalmers Guest, FNP

## 2022-07-22 NOTE — Assessment & Plan Note (Addendum)
Blood pressure elevated in the office today. He will watch sodium in diet, start a moderate exercise program, take blood pressure at home and keep log. Return in 3 weeks with BP log to assess need for medication per PCP.

## 2022-07-23 ENCOUNTER — Other Ambulatory Visit: Payer: Self-pay

## 2022-07-28 DIAGNOSIS — F411 Generalized anxiety disorder: Secondary | ICD-10-CM | POA: Diagnosis not present

## 2022-08-05 DIAGNOSIS — F411 Generalized anxiety disorder: Secondary | ICD-10-CM | POA: Diagnosis not present

## 2022-08-06 ENCOUNTER — Telehealth: Payer: Self-pay | Admitting: Pharmacist

## 2022-08-06 NOTE — Telephone Encounter (Signed)
Received notification from [facility] regarding a prior authorization for ENBREL. Authorization has been APPROVED from 08/06/2022 to 02/06/2023 (? - no end date written in approval letter). Approval letter sent to scan center.  Patient must continue to fill through [facility]: [phone]   Authorization # [id number]  [employer] updated  [name], PharmD, MPH, BCPS, CPP Clinical Pharmacist (Rheumatology and Pulmonology)

## 2022-08-06 NOTE — Telephone Encounter (Signed)
Submitted a Prior Authorization request to [facility] for ENBREL via CoverMyMeds. Will update once we receive a response.  [id number]  Once approved, [employer] will need to be updated  [name], PharmD, MPH, BCPS, CPP Clinical Pharmacist (Rheumatology and Pulmonology)

## 2022-08-09 NOTE — Progress Notes (Unsigned)
Office Visit Note  Patient: Luis Khan             Date of Birth: 1985-12-11           MRN: [id number]             PCP: de [place], Raymond J, MD Referring: de [place], Raymond J, MD Visit Date: 08/10/2022   Subjective:  No chief complaint on file.   History of Present Illness: Luis Khan is a 37 y.o. male here for follow up ***   Previous HPI 05/05/22 Luis Khan is a 37 y.o. male here for follow up for axial spondyloarthritis after starting Enbrel 50 mg [place] weekly. He has seen a large improvement in symptoms started since about 2 or 3 weeks in and has done well since then. Morning stiffness is now minimal. Flexibility in hips and knees improved. Still taking aleve routine twice daily. Skin rashes are also largely but not completely better on scalp, arms, and hips.   Previous HPI 02/10/22 Luis Khan is a 37 y.o. male here for follow up for evaluation of recurrent left knee effusion and joint pains in shoulders, back, and hips. Labs at initial visit positive for HLA-B27 and elevated ESR and CRP.  Since her last visit he had a left knee steroid injection with Dr. Barbaraann Barthel with improvement in symptoms as he needs to be functional for an upcoming golf event this weekend.  Otherwise symptoms remain about the same he is still requiring maximal dose of naproxen to mostly control symptoms.   Previous HPI 01/26/2022 Luis Khan is a 37 y.o. male here for evaluation of recurrent left knee effusion also joint pains increased in multiple areas. He has been noticing new or increased symptoms since last summer with left knee effusion and restricted range of motion. He also had ongoing right knee pain with patellar tendonitis.  Imaging without significant OA changes and knee aspiration with moderate WBC increase otherwise bland. The knee effusion remained improved for months and he participated in PT for several months with a good benefit in his symptoms. However over time and  especially since finishing PT symptoms are worsening again now including mid back pain, shoulders, hips, and knee pains. His back pain is most severe at night and first thing in the mornings. He sees a large but incomplete improvement with taking 2 tablets of naproxen, most effective at night time to reduce morning symptoms. He had recurrence of left knee effusion and aspiration by Dr. Barbaraann Barthel with findings of elevated cell count both RBCs and WBCs 7398. Previous aspiration from a year ago with elevated WBCs 4067. He does have a history of skin psoriasis, referred to new dermatology last October for increase in symptoms and he is controlling this with topical clobetasol. He has chronic GI symptoms attributed to IBS and also history of celiac disease. Currently these symptoms are at about normal baseline. He was also seeing his eye doctor recent with recurrent styes or hordeolum, but no history of iritis or uveitis known.    Labs reviewed 02/2021 RF neg ANA neg ESR 4 CRP 5   Imaging reviewed 02/10/2021 MRI Left knee IMPRESSION: Focal mild bony edema within the medial aspect of the mid patella, near the MPFL/medial retinacular attachment. The MPFL/medial retinaculum appear intact. This could represent a focal bony contusion or traction injury.   Moderate-sized joint effusion.   No evidence of meniscus tear.     No Rheumatology ROS completed.  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   Knee pain, chronic  03/04/2021   Sudden visual loss, right eye 02/17/2021   Hordeolum externum of right upper eyelid 07/19/2018   Gastroesophageal reflux disease 04/09/2015    Past Medical History:  Diagnosis Date   Allergy    Anxiety    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 ([place]) 02/10/2022   Blood transfusion without reported diagnosis    Chronic allergic rhinitis    Flat feet, bilateral    GERD (gastroesophageal reflux disease) 10/03/2014   NASH (nonalcoholic steatohepatitis)    Patellar tendinitis    Right Knee   Premature labor, onset of delivery before 37 weeks of gestation, fetus 5 07/09/2015   Psoriasis 09/2814    Family History  Problem Relation Age of Onset   Irritable bowel syndrome Mother    Kidney Stones Mother    Hypertension Father    Gallstones Father    Hyperlipidemia Father    Gallbladder disease Maternal Aunt    Kidney cancer Maternal Aunt    Diabetes Mellitus II Maternal Uncle    Diabetes Maternal Uncle    Pancreatic cancer Maternal Grandmother    Pancreatic disease Maternal Grandmother    Cancer Maternal Grandmother    Dementia Maternal Grandfather    Heart attack Maternal Grandfather    Gallstones Paternal Grandmother    Drug abuse Paternal Grandmother    Hypertension Paternal Grandmother    Kidney Stones Paternal Grandmother    Congestive Heart Failure Paternal Grandfather    Coronary artery disease Paternal Grandfather    Heart attack Paternal Grandfather    Diabetes Paternal Grandfather    Healthy Daughter    Past Surgical History:  Procedure Laterality Date   CHOLECYSTECTOMY N/A 07/23/2016   Procedure: LAPAROSCOPIC CHOLECYSTECTOMY WITH INTRAOPERATIVE CHOLANGIOGRAM;  Surgeon: Leonie Green, MD;  Location: [facility] ORS;  Service: General;  Laterality: N/A;   ESOPHAGOGASTRODUODENOSCOPY (EGD) WITH PROPOFOL N/A 08/01/2015   Procedure: ESOPHAGOGASTRODUODENOSCOPY (EGD) WITH PROPOFOL;  Surgeon: Lollie Sails, MD;  Location: [facility] ENDOSCOPY;   Service: Endoscopy;  Laterality: N/A;   WISDOM TOOTH EXTRACTION     Social History   Social History Narrative   Not on file   Immunization History  Administered Date(s) Administered   Influenza,inj,Quad PF,6+ Mos 04/03/2019, 03/21/2022   Influenza-Unspecified 03/02/2018, 02/27/2020, 04/03/2021   Tdap 01/31/2018     Objective: Vital Signs: There were no vitals taken for this visit.   Physical Exam   Musculoskeletal Exam: ***  CDAI Exam: CDAI Score: -- Patient Global: --; Provider Global: -- Swollen: --; Tender: -- Joint Exam 08/10/2022   No joint exam has been documented for this visit   There is currently no information documented on the homunculus. Go to the Rheumatology activity and complete the homunculus joint exam.  Investigation: No additional findings.  Imaging: No results found.  Recent Labs: Lab Results  Component Value Date   WBC 5.4 03/25/2022   HGB 16.2 03/25/2022   PLT 225 03/25/2022   NA 140 03/25/2022   K 4.3 03/25/2022  CL 104 03/25/2022   CO2 28 03/25/2022   GLUCOSE 72 03/25/2022   BUN 14 03/25/2022   CREATININE 0.94 03/25/2022   BILITOT 0.9 03/25/2022   ALKPHOS 70 07/15/2021   AST 29 03/25/2022   ALT 49 (H) 03/25/2022   PROT 7.7 03/25/2022   ALBUMIN 4.8 07/15/2021   CALCIUM 9.5 03/25/2022   GFRAA 107 12/26/2018   QFTBGOLDPLUS NEGATIVE 01/26/2022    Speciality Comments: No specialty comments available.  Procedures:  No procedures performed Allergies: Augmentin [amoxicillin-pot clavulanate], Cephalexin, and Z-pak [azithromycin]   Assessment / Plan:     Visit Diagnoses: No diagnosis found.  ***  Orders: No orders of the defined types were placed in this encounter.  No orders of the defined types were placed in this encounter.    Follow-Up Instructions: No follow-ups on file.   Collier Salina, MD  Note - This record has been created using [employer].  Chart creation errors have been sought, but may not  always  have been located. Such creation errors do not reflect on  the standard of medical care.

## 2022-08-10 ENCOUNTER — Other Ambulatory Visit: Payer: Self-pay

## 2022-08-10 ENCOUNTER — Ambulatory Visit: Payer: Commercial Managed Care - PPO | Attending: Internal Medicine | Admitting: Internal Medicine

## 2022-08-10 ENCOUNTER — Other Ambulatory Visit (HOSPITAL_COMMUNITY): Payer: Self-pay

## 2022-08-10 ENCOUNTER — Encounter: Payer: Self-pay | Admitting: Internal Medicine

## 2022-08-10 VITALS — BP 130/88 | HR 59 | Resp 16 | Ht 77.0 in | Wt 252.0 lb

## 2022-08-10 DIAGNOSIS — M452 Ankylosing spondylitis of cervical region: Secondary | ICD-10-CM

## 2022-08-10 DIAGNOSIS — Z79899 Other long term (current) drug therapy: Secondary | ICD-10-CM | POA: Diagnosis not present

## 2022-08-10 DIAGNOSIS — L409 Psoriasis, unspecified: Secondary | ICD-10-CM

## 2022-08-10 MED ORDER — ENBREL MINI 50 MG/ML ~~LOC~~ SOCT
50.0000 mg | SUBCUTANEOUS | 0 refills | Status: DC
  Filled 2022-08-10: qty 12, 84d supply, fill #0
  Filled 2022-08-23: qty 4, 28d supply, fill #0
  Filled 2022-09-20: qty 4, 28d supply, fill #1
  Filled 2022-10-21: qty 4, 28d supply, fill #2

## 2022-08-11 LAB — C-REACTIVE PROTEIN: CRP: 3 mg/L (ref <8.0)

## 2022-08-11 LAB — CBC WITH DIFFERENTIAL/PLATELET
Absolute Monocytes: 445 cells/uL (ref 200–950)
Basophils Absolute: 31 cells/uL (ref 0–200)
Basophils Relative: 0.8 %
Eosinophils Absolute: 133 cells/uL (ref 15–500)
Eosinophils Relative: 3.4 %
HCT: 46 % (ref 38.5–50.0)
Hemoglobin: 15.7 g/dL (ref 13.2–17.1)
Lymphs Abs: 1533 cells/uL (ref 850–3900)
MCH: 31.8 pg (ref 27.0–33.0)
MCHC: 34.1 g/dL (ref 32.0–36.0)
MCV: 93.3 fL (ref 80.0–100.0)
MPV: 10.7 fL (ref 7.5–12.5)
Monocytes Relative: 11.4 %
Neutro Abs: 1759 cells/uL (ref 1500–7800)
Neutrophils Relative %: 45.1 %
Platelets: 216 10*3/uL (ref 140–400)
RBC: 4.93 10*6/uL (ref 4.20–5.80)
RDW: 12.6 % (ref 11.0–15.0)
Total Lymphocyte: 39.3 %
WBC: 3.9 10*3/uL (ref 3.8–10.8)

## 2022-08-11 LAB — COMPLETE METABOLIC PANEL WITH GFR
AG Ratio: 1.5 (calc) (ref 1.0–2.5)
ALT: 84 U/L — ABNORMAL HIGH (ref 9–46)
AST: 47 U/L — ABNORMAL HIGH (ref 10–40)
Albumin: 4.3 g/dL (ref 3.6–5.1)
Alkaline phosphatase (APISO): 59 U/L (ref 36–130)
BUN: 14 mg/dL (ref 7–25)
CO2: 29 mmol/L (ref 20–32)
Calcium: 9.1 mg/dL (ref 8.6–10.3)
Chloride: 104 mmol/L (ref 98–110)
Creat: 0.87 mg/dL (ref 0.60–1.26)
Globulin: 2.8 g/dL (calc) (ref 1.9–3.7)
Glucose, Bld: 90 mg/dL (ref 65–99)
Potassium: 4.4 mmol/L (ref 3.5–5.3)
Sodium: 139 mmol/L (ref 135–146)
Total Bilirubin: 1.2 mg/dL (ref 0.2–1.2)
Total Protein: 7.1 g/dL (ref 6.1–8.1)
eGFR: 113 mL/min/{1.73_m2} (ref >=60)

## 2022-08-11 NOTE — Progress Notes (Signed)
CRP is normal. His AST is mildly elevated at 47 up from 29 and 84 up from 49. Enbrel can rarely affect liver function but could also be incidental. These are pretty small changes so I think it is okay to just monitor for now, if there is a trend in follow up might need to look into it.

## 2022-08-12 DIAGNOSIS — F411 Generalized anxiety disorder: Secondary | ICD-10-CM | POA: Diagnosis not present

## 2022-08-16 DIAGNOSIS — F411 Generalized anxiety disorder: Secondary | ICD-10-CM | POA: Diagnosis not present

## 2022-08-19 ENCOUNTER — Other Ambulatory Visit (HOSPITAL_COMMUNITY): Payer: Self-pay

## 2022-08-23 ENCOUNTER — Other Ambulatory Visit: Payer: Self-pay

## 2022-08-23 ENCOUNTER — Other Ambulatory Visit (HOSPITAL_COMMUNITY): Payer: Self-pay

## 2022-08-24 ENCOUNTER — Other Ambulatory Visit: Payer: Self-pay

## 2022-08-24 ENCOUNTER — Other Ambulatory Visit (HOSPITAL_COMMUNITY): Payer: Self-pay

## 2022-08-25 ENCOUNTER — Encounter (HOSPITAL_BASED_OUTPATIENT_CLINIC_OR_DEPARTMENT_OTHER): Payer: Self-pay | Admitting: Family Medicine

## 2022-08-25 ENCOUNTER — Ambulatory Visit (HOSPITAL_BASED_OUTPATIENT_CLINIC_OR_DEPARTMENT_OTHER): Payer: Commercial Managed Care - PPO | Admitting: Family Medicine

## 2022-08-25 VITALS — BP 132/99 | HR 67 | Temp 97.9°F | Ht 77.0 in | Wt 247.9 lb

## 2022-08-25 DIAGNOSIS — R030 Elevated blood-pressure reading, without diagnosis of hypertension: Secondary | ICD-10-CM | POA: Diagnosis not present

## 2022-08-25 DIAGNOSIS — F411 Generalized anxiety disorder: Secondary | ICD-10-CM | POA: Diagnosis not present

## 2022-08-25 NOTE — Patient Instructions (Signed)
  Medication Instructions:  Your physician recommends that you continue on your current medications as directed. Please refer to the Current Medication list given to you today. --If you need a refill on any your medications before your next appointment, please call your pharmacy first. If no refills are authorized on file call the office.-- Follow-Up: Your next appointment:  2-3 months Your physician recommends that you schedule a follow-up appointment in: 2-3 months with Dr. de [place]  You will receive a text message or e-mail with a link to a survey about your care and experience with [place] today! We would greatly appreciate your feedback!   Thanks for letting us be apart of your health journey!!  Primary Care and Sports Medicine   Dr. Arlina Robes [place]   We encourage you to activate your patient portal called "MyChart".  Sign up information is provided on this After Visit Summary.  MyChart is used to connect with patients for Virtual Visits (Telemedicine).  Patients are able to view lab/test results, encounter notes, upcoming appointments, etc.  Non-urgent messages can be sent to your provider as well. To learn more about what you can do with MyChart, please visit --  [email or web address].

## 2022-08-25 NOTE — Progress Notes (Signed)
    Procedures performed today:    None.  Independent interpretation of notes and tests performed by another provider:   None.  Brief History, Exam, Impression, and Recommendations:    BP (!) 132/99 (BP Location: Right Arm)   Pulse 67   Temp 97.9 F (36.6 C) (Oral)   Ht 6\' 5"  (1.956 m)   Wt 247 lb 14.4 oz (112.4 kg)   SpO2 100%   BMI 29.40 kg/m   Elevated blood pressure reading in office without diagnosis of hypertension Patient presents for follow-up of elevated blood pressure.  Blood pressure was found to be slightly elevated at prior office visit; he is following up today for continued monitoring and review of home blood pressure readings.  Home blood pressure readings have been borderline with very slightly elevated systolic and diastolic readings.  He has been working on lifestyle modifications including dietary changes, increasing regular physical activity.  He has been primarily doing more Peloton exercises.  We discussed considerations related to this today Blood pressure is borderline in office today, systolic is fairly well-controlled, slightly increased diastolic reading.  Recommend continuing with lifestyle modifications and interventions.  Do not feel that pharmacotherapy is needed at this time Will plan for close follow-up to monitor progress Recommend DASH diet, intermittent monitoring of blood pressure at home  Return in about 3 months (around 11/25/2022) for BP.   ___________________________________________ Zafirah Vanzee de [place], MD, ABFM, [facility] and [place]

## 2022-08-25 NOTE — Assessment & Plan Note (Signed)
Patient presents for follow-up of elevated blood pressure.  Blood pressure was found to be slightly elevated at prior office visit; he is following up today for continued monitoring and review of home blood pressure readings.  Home blood pressure readings have been borderline with very slightly elevated systolic and diastolic readings.  He has been working on lifestyle modifications including dietary changes, increasing regular physical activity.  He has been primarily doing more Peloton exercises.  We discussed considerations related to this today Blood pressure is borderline in office today, systolic is fairly well-controlled, slightly increased diastolic reading.  Recommend continuing with lifestyle modifications and interventions.  Do not feel that pharmacotherapy is needed at this time Will plan for close follow-up to monitor progress Recommend DASH diet, intermittent monitoring of blood pressure at home

## 2022-08-27 ENCOUNTER — Other Ambulatory Visit (HOSPITAL_COMMUNITY): Payer: Self-pay

## 2022-08-27 MED ORDER — DOXYCYCLINE MONOHYDRATE 50 MG PO CAPS
50.0000 mg | ORAL_CAPSULE | Freq: Every day | ORAL | 3 refills | Status: DC
  Filled 2022-08-27: qty 30, 30d supply, fill #0
  Filled 2022-10-21: qty 30, 30d supply, fill #1
  Filled 2022-11-15: qty 30, 30d supply, fill #2
  Filled 2023-04-25: qty 30, 30d supply, fill #3

## 2022-09-01 DIAGNOSIS — F411 Generalized anxiety disorder: Secondary | ICD-10-CM | POA: Diagnosis not present

## 2022-09-03 ENCOUNTER — Other Ambulatory Visit (HOSPITAL_COMMUNITY): Payer: Self-pay

## 2022-09-09 DIAGNOSIS — F411 Generalized anxiety disorder: Secondary | ICD-10-CM | POA: Diagnosis not present

## 2022-09-13 ENCOUNTER — Ambulatory Visit (HOSPITAL_BASED_OUTPATIENT_CLINIC_OR_DEPARTMENT_OTHER): Payer: Commercial Managed Care - PPO | Admitting: Family Medicine

## 2022-09-13 ENCOUNTER — Other Ambulatory Visit (HOSPITAL_COMMUNITY): Payer: Self-pay

## 2022-09-13 ENCOUNTER — Encounter (HOSPITAL_BASED_OUTPATIENT_CLINIC_OR_DEPARTMENT_OTHER): Payer: Self-pay | Admitting: Family Medicine

## 2022-09-13 VITALS — BP 135/92 | HR 73 | Temp 97.6°F | Ht 77.0 in | Wt 242.6 lb

## 2022-09-13 DIAGNOSIS — F411 Generalized anxiety disorder: Secondary | ICD-10-CM | POA: Insufficient documentation

## 2022-09-13 MED ORDER — ESCITALOPRAM OXALATE 5 MG PO TABS
5.0000 mg | ORAL_TABLET | Freq: Every day | ORAL | 0 refills | Status: DC
  Filled 2022-09-13: qty 45, 45d supply, fill #0

## 2022-09-13 NOTE — Progress Notes (Signed)
Established Patient Office Visit  Subjective   Patient ID: Luis Khan, male    DOB: Apr 28, 1986  Age: 37 y.o. 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 Became very irritable, at the time very understandalbe but now he realizes that worry/anxiety has lingered and escalated to the level it was in the past  Has been in therapy since mid 2020- goes weekly  Has had positive improvement associated with therapy  But has been an up/down rollercoaster that seems to have "bigger waves"  "Last 6 months waves have greatly" Last month he reports that he has struggled more often-- it feels like he is back to where he started in middle school and reports lack of control   Panic attack over Easter weekend- which prompted him to make this appt  Has not had panic attack since middle school  Reports it was precipitated with a busy schedule and "being overstimulated." He often gets in his head and he starts to constantly worry about what is going to happen or if something happens, how he will handle it.    Review of Systems  Constitutional:  Negative for malaise/fatigue.  Cardiovascular:  Negative for chest pain.  Gastrointestinal:  Negative for abdominal pain, nausea and vomiting.  Musculoskeletal:  Negative for myalgias.  Neurological:  Negative for dizziness and headaches.  Psychiatric/Behavioral:  Negative for depression, substance abuse and suicidal ideas. The patient is nervous/anxious. The patient does not have insomnia.     Objective:    BP (!) 135/92 (BP Location: Left  Arm, Patient Position: Sitting, Cuff Size: Normal)   Pulse 73   Temp 97.6 F (36.4 C) (Oral)   Ht 6\' 5"  (1.956 m)   Wt 242 lb 9.6 oz (110 kg)   SpO2 100%   BMI 28.77 kg/m  BP Readings from Last 3 Encounters:  09/13/22 (!) 135/92  08/25/22 (!) 132/99  08/10/22 130/88    Physical Exam Constitutional:      Appearance: Normal appearance.  Cardiovascular:     Rate and Rhythm: Normal rate and regular rhythm.     Pulses: Normal pulses.     Heart sounds: Normal heart sounds.  Pulmonary:     Effort: Pulmonary effort is normal.     Breath sounds: Normal breath sounds.  Neurological:     Mental Status: He is alert.  Psychiatric:        Mood and Affect: Mood normal.        Behavior: Behavior normal.        Thought Content: Thought content normal.        Judgment: Judgment normal.        09/13/2022    3:44 PM 09/13/2022    3:19 PM 08/25/2022   10:26 AM 07/22/2022    8:43 AM  GAD 7 : Generalized Anxiety Score  Nervous, Anxious, on Edge 3 0 0 0  Control/stop worrying 3 0 0 0  Worry too much - different things 2 0 0 0  Trouble relaxing 1 0 0 0  Restless 0 0 0 0  Easily annoyed or irritable 2 0 0 0  Afraid - awful might happen 0 0 0 0  Total GAD 7 Score 11 0 0 0  Anxiety Difficulty Somewhat difficult Not difficult at all Not difficult at all Not difficult at all     Assessment & Plan:  1. Generalized anxiety disorder Patient reports history of anxiety and depression. Reports he has been having increased anxiety over the past month and would like to discuss medication options. GAD7 and PHQ9 completed. GAD7 indicates moderate anxiety. Denies SI/HI. Discussed options, would like to proceed with Lexapro 5mg  daily. Educated about mechanism of action, common side effects, daily adherence to medication, and that improvement in mood may take 4-6 weeks. Plan to follow-up in 6 weeks for mood. Advised patient that he could schedule a video visit since his physical is schedule for the end of June.    Return in about 6 weeks (around 10/25/2022) for Mood f/u.    Alyson Reedy, FNP

## 2022-09-15 DIAGNOSIS — F411 Generalized anxiety disorder: Secondary | ICD-10-CM | POA: Diagnosis not present

## 2022-09-16 ENCOUNTER — Other Ambulatory Visit (HOSPITAL_COMMUNITY): Payer: Self-pay

## 2022-09-20 ENCOUNTER — Ambulatory Visit (HOSPITAL_BASED_OUTPATIENT_CLINIC_OR_DEPARTMENT_OTHER): Payer: Commercial Managed Care - PPO | Admitting: Family Medicine

## 2022-09-20 ENCOUNTER — Encounter (HOSPITAL_BASED_OUTPATIENT_CLINIC_OR_DEPARTMENT_OTHER): Payer: Self-pay | Admitting: Family Medicine

## 2022-09-20 ENCOUNTER — Ambulatory Visit (HOSPITAL_BASED_OUTPATIENT_CLINIC_OR_DEPARTMENT_OTHER): Payer: Commercial Managed Care - PPO

## 2022-09-20 ENCOUNTER — Other Ambulatory Visit (HOSPITAL_COMMUNITY): Payer: Self-pay

## 2022-09-20 VITALS — BP 133/97 | HR 62 | Temp 97.6°F | Ht 77.0 in | Wt 243.0 lb

## 2022-09-20 DIAGNOSIS — R053 Chronic cough: Secondary | ICD-10-CM

## 2022-09-20 NOTE — Progress Notes (Signed)
Established Patient Office Visit  Subjective   Patient ID: Luis Khan, male    DOB: [birth year]  Age: 37 y.o. MRN: [id number]  Chief Complaint  Patient presents with   Cough    Pt here for having a continued cough, started about three weeks ago, pt stated he does have seasonal allergies but he don't think it's related to this, coughing up green and yellow mucus    SUBJECTIVE:   Luis Khan is a 37 y.o. male who complains of cough and sputum production. States he has had this cough for about two weeks but unsure if it presented two weeks ago or continues to persist after he had COVID. Cough does not worsen at night- described as productive of yellow and green sputum. Reports he went into a coughing fit last Wednesday. Denies shob, sinus pain/pressure, wheezing, congestion, sneezing, sore throat, swollen glands, nasal blockage, post nasal drip, night sweat, myalgias, headache, watery or itchy eyes, fever, and chills. Reports he tested positive for COVID about one month ago and thought it was just his allergies.   Denies around anyone who is sick.  OTC Robitussin DM over the weekend and cough has not changed  Takes Allegra & Flonase every day, regardless of season  Review of Systems  Constitutional:  Negative for chills, fever and malaise/fatigue.  HENT:  Negative for congestion, sinus pain and sore throat.   Eyes:  Negative for pain and redness.  Respiratory:  Positive for cough and sputum production. Negative for shortness of breath and wheezing.   Cardiovascular:  Negative for chest pain.  Gastrointestinal:  Negative for abdominal pain, nausea and vomiting.  Musculoskeletal:  Negative for myalgias.  Skin:  Negative for itching and rash.  Neurological:  Negative for dizziness and headaches.  Endo/Heme/Allergies:  Positive for environmental allergies.  Psychiatric/Behavioral:  The patient does not have insomnia.       Objective:     BP (!) 133/97 (BP Location: Left Arm,  Patient Position: Sitting, Cuff Size: Normal)   Pulse 62   Temp 97.6 F (36.4 C) (Oral)   Ht  (1.956 m)   Wt 243 lb (110.2 kg)   SpO2 99%   BMI 28.82 kg/m  BP Readings from Last 3 Encounters:  09/20/22 (!) 133/97  09/13/22 (!) 135/92  08/25/22 (!) 132/99     Physical Exam Constitutional:      Appearance: Normal appearance.  HENT:     Right Ear: Tympanic membrane and ear canal normal.     Left Ear: Tympanic membrane and ear canal normal.  Eyes:     General: Lids are normal.  Cardiovascular:     Rate and Rhythm: Normal rate and regular rhythm.     Pulses: Normal pulses.     Heart sounds: Normal heart sounds.  Pulmonary:     Effort: Pulmonary effort is normal. No tachypnea, accessory muscle usage or respiratory distress.     Breath sounds: Decreased air movement (posteriorly in all lung fields) present. No decreased breath sounds (posteriorly in all lung fields), wheezing, rhonchi or rales.  Lymphadenopathy:     Cervical: No cervical adenopathy.  Neurological:     Mental Status: He is alert.  Psychiatric:        Mood and Affect: Mood normal.        Behavior: Behavior normal.        Thought Content: Thought content normal.        Judgment: Judgment normal.    Assessment &  Plan:  1. Persistent cough On exam, patient appears well, vital signs are as noted. Ears normal.  Throat and pharynx normal- no redness noted.  Neck supple- no lymphadenopathy present. Nose is not congested and without swollen turbinates. Sinuses non-tender. Anterior chest is clear, without adventitious lung sounds, and posterior chest is clear but diminished in all lung fields. Based on reported symptoms and physical exam, etiology unclear. Possible viral URI due to allergies/allergic rhinitis; however, may be post-viral syndrome/ persistent airway inflammation due to past COVID infection,or uncontrolled acid reflux (?). Symptomatic therapy suggested: push fluids, rest, use cough suppressant of choice  prn, and return office visit prn if symptoms persist or worsen. Due to decreased breath sounds, will obtain CXR. Call or return to clinic prn if these symptoms worsen or fail to improve as anticipated.  - DG Chest 2 View  Return if symptoms worsen or fail to improve.    Alyson Reedy, FNP

## 2022-09-22 DIAGNOSIS — F411 Generalized anxiety disorder: Secondary | ICD-10-CM | POA: Diagnosis not present

## 2022-09-23 ENCOUNTER — Ambulatory Visit
Admission: RE | Admit: 2022-09-23 | Discharge: 2022-09-23 | Disposition: A | Discharge status: Home / Self Care | Payer: Commercial Managed Care - PPO | Source: Ambulatory Visit | Attending: Family Medicine | Admitting: Family Medicine

## 2022-09-23 DIAGNOSIS — R0689 Other abnormalities of breathing: Secondary | ICD-10-CM | POA: Diagnosis not present

## 2022-09-24 ENCOUNTER — Other Ambulatory Visit: Payer: Self-pay

## 2022-09-29 ENCOUNTER — Other Ambulatory Visit (HOSPITAL_COMMUNITY): Payer: Self-pay

## 2022-09-29 ENCOUNTER — Encounter (HOSPITAL_BASED_OUTPATIENT_CLINIC_OR_DEPARTMENT_OTHER): Payer: Self-pay | Admitting: Family Medicine

## 2022-09-29 ENCOUNTER — Other Ambulatory Visit (HOSPITAL_BASED_OUTPATIENT_CLINIC_OR_DEPARTMENT_OTHER): Payer: Self-pay | Admitting: Family Medicine

## 2022-09-29 DIAGNOSIS — F411 Generalized anxiety disorder: Secondary | ICD-10-CM | POA: Diagnosis not present

## 2022-09-29 MED ORDER — LEVOFLOXACIN 500 MG PO TABS: 500.0000 mg | ORAL_TABLET | Freq: Every day | ORAL | 0 refills | Status: AC

## 2022-10-13 DIAGNOSIS — F411 Generalized anxiety disorder: Secondary | ICD-10-CM | POA: Diagnosis not present

## 2022-10-14 ENCOUNTER — Other Ambulatory Visit (HOSPITAL_COMMUNITY): Payer: Self-pay

## 2022-10-14 ENCOUNTER — Ambulatory Visit (HOSPITAL_BASED_OUTPATIENT_CLINIC_OR_DEPARTMENT_OTHER): Payer: Commercial Managed Care - PPO | Admitting: Family Medicine

## 2022-10-14 ENCOUNTER — Encounter (HOSPITAL_BASED_OUTPATIENT_CLINIC_OR_DEPARTMENT_OTHER): Payer: Self-pay | Admitting: Family Medicine

## 2022-10-14 VITALS — BP 139/92 | HR 64 | Temp 97.9°F | Ht 77.0 in | Wt 245.0 lb

## 2022-10-14 DIAGNOSIS — R058 Other specified cough: Secondary | ICD-10-CM | POA: Diagnosis not present

## 2022-10-14 MED ORDER — PREDNISONE 5 MG PO TABS
ORAL_TABLET | ORAL | 0 refills | Status: DC
  Filled 2022-10-14: qty 21, 6d supply, fill #0

## 2022-10-14 MED ORDER — IPRATROPIUM BROMIDE 0.03 % NA SOLN
2.0000 | Freq: Two times a day (BID) | NASAL | 12 refills | Status: DC
  Filled 2022-10-14: qty 30, 30d supply, fill #0

## 2022-10-14 NOTE — Patient Instructions (Signed)
Day 1: Administer 30 mg on day 1 as 10 mg at breakfast, 5 mg at lunch, 5 mg at dinner, and 10 mg at bedtime.  Day 2: Administer 25 mg on day 2 as 5 mg at breakfast, 5 mg at lunch, 5 mg at dinner, and 10 mg at bedtime.  Day 3: Administer 20 mg on day 3 as 5 mg at breakfast, 5 mg at lunch, 5 mg at dinner, and 5 mg at bedtime.  Day 4: Administer 15 mg on day 4 as 5 mg at breakfast, 5 mg at lunch, and 5 mg at bedtime.  Day 5: Administer 10 mg on day 5 as 5 mg at breakfast and 5 mg at bedtime.  Day 6: Administer 5 mg on day 6 as 5 mg at breakfast.

## 2022-10-14 NOTE — Progress Notes (Signed)
Acute Office Visit  Subjective:     Patient ID: Luis Khan, male    DOB: 11-30-1985, 37 y.o.   MRN: [id number]  Luis Khan is a 37 yo male patient who presents today for a persistent cough.   He was last seen in office 09/20/22 for cough and was treated symptomatically. CXR results were normal.  Patient reached out 4/24 for sinus pressure and was treated with levofloxacin for 7 days. Reports his congestion improved, but his cough stayed. He completed course of abx. On day 5- wheezing pretty good, but subsided now. Denies chest pain, shob, fevers/chills, body aches, sinus pain/pressure. Cough has not affected quality of sleep. Reports it feels like his ears need to pop and he is coughing up sputum.   Feels clear all morning and then 2pm he'll be congested. Coughing fits went away but always coughing stuff up. Sputum production- amount has been less but still coughing up green sputum. Usually starts in the afternoon/evening.    Currently taking daily Allegra & Flonase- not causing a worsening in symptoms but does not notice improvement with them either. He has been working out for past 3 months and feel that his lung volume is adequate and does not experience with shob. Denies history of asthma. Reports history of psoriatic arthritis & had COVID in March 2024.    Review of Systems  Constitutional:  Negative for malaise/fatigue.  HENT:  Positive for congestion. Negative for ear pain (fullness present, more in R ear than L), sinus pain, sore throat and tinnitus.   Respiratory:  Positive for cough and sputum production. Negative for shortness of breath and wheezing.   Cardiovascular:  Negative for chest pain and palpitations.  Gastrointestinal:  Negative for abdominal pain, nausea and vomiting.  Neurological:  Negative for weakness and headaches.  Psychiatric/Behavioral:  Negative for depression and suicidal ideas. The patient is not nervous/anxious.        Objective:    BP (!)  139/92 (BP Location: Left Arm, Patient Position: Sitting, Cuff Size: Normal)   Pulse 64   Temp 97.9 F (36.6 C) (Oral)   Ht 6\' 5"  (1.956 m)   Wt 245 lb (111.1 kg)   SpO2 98%   BMI 29.05 kg/m   Physical Exam Constitutional:      Appearance: Normal appearance.  Cardiovascular:     Rate and Rhythm: Normal rate and regular rhythm.     Pulses: Normal pulses.     Heart sounds: Normal heart sounds.  Pulmonary:     Effort: Pulmonary effort is normal. No accessory muscle usage, prolonged expiration or respiratory distress.     Breath sounds: Normal breath sounds. No decreased air movement. No decreased breath sounds, wheezing, rhonchi or rales.  Neurological:     Mental Status: He is alert.  Psychiatric:        Mood and Affect: Mood normal.        Behavior: Behavior normal.        Thought Content: Thought content normal.        Judgment: Judgment normal.        Assessment & Plan:  1. Upper airway cough syndrome Patient presents with a persistent cough since last month. Patient in no respiratory distress and is well-appearing. Denies chest pain, shortness of breath palpitations, lower extremity edema, headaches, lightheadedness, weakness, and cough. HENT exam unremarkable. The ear canal is clear without cerumen impaction/discharge. The left and right tympanic membranes are translucent, pearly grey, and shiny with no  bulging or retraction. Normal in appearance with good cone-shaped light reflection of light and smooth consistency. Landmarks are clearly visible. Nose with swollen, red turbinates. Maxillary and frontal sinuses are non-tender to palpation.  Cardiovascular exam with heart regular rate and rhythm. Normal heart sounds, no murmurs present. No lower extremity edema present. Lungs clear to auscultation bilaterally anteriorly and posteriorly. He reports daily medication adherence to Allegra and Flonase. Unclear etiology of cough. No infectious signs or symptoms present on today's exam-  no indication for antibiotics today. Possibly related to bronchial hyperresponsiveness (?). Will prescribe prednisone taper over one week. Instructions for taper provided to patient on discharge paperwork. Will trial switching from Flonase to Nasacort and adding Atrovent to his current regimen for symptom relief. Advised patient of possible side effects and to return to office if symptoms persist or worsen. Discussed possible referral to pulmonology.    Return if symptoms worsen or fail to improve.  Alyson Reedy, FNP

## 2022-10-18 ENCOUNTER — Other Ambulatory Visit (HOSPITAL_BASED_OUTPATIENT_CLINIC_OR_DEPARTMENT_OTHER): Payer: Self-pay | Admitting: Family Medicine

## 2022-10-18 ENCOUNTER — Other Ambulatory Visit (HOSPITAL_COMMUNITY): Payer: Self-pay

## 2022-10-18 MED ORDER — ESCITALOPRAM OXALATE 5 MG PO TABS
5.0000 mg | ORAL_TABLET | Freq: Every day | ORAL | 0 refills | Status: DC
  Filled 2022-10-18 – 2022-10-21 (×2): qty 45, 45d supply, fill #0

## 2022-10-21 ENCOUNTER — Other Ambulatory Visit (HOSPITAL_COMMUNITY): Payer: Self-pay

## 2022-10-21 DIAGNOSIS — F411 Generalized anxiety disorder: Secondary | ICD-10-CM | POA: Diagnosis not present

## 2022-10-22 ENCOUNTER — Other Ambulatory Visit: Payer: Self-pay

## 2022-11-08 ENCOUNTER — Ambulatory Visit (HOSPITAL_BASED_OUTPATIENT_CLINIC_OR_DEPARTMENT_OTHER): Payer: Commercial Managed Care - PPO | Admitting: Family Medicine

## 2022-11-08 ENCOUNTER — Encounter (HOSPITAL_BASED_OUTPATIENT_CLINIC_OR_DEPARTMENT_OTHER): Payer: Self-pay | Admitting: Family Medicine

## 2022-11-08 VITALS — BP 140/100 | HR 56 | Ht 77.0 in | Wt 240.8 lb

## 2022-11-08 DIAGNOSIS — R42 Dizziness and giddiness: Secondary | ICD-10-CM

## 2022-11-08 DIAGNOSIS — R030 Elevated blood-pressure reading, without diagnosis of hypertension: Secondary | ICD-10-CM | POA: Diagnosis not present

## 2022-11-08 DIAGNOSIS — F411 Generalized anxiety disorder: Secondary | ICD-10-CM | POA: Diagnosis not present

## 2022-11-08 NOTE — Assessment & Plan Note (Signed)
Blood pressure remains borderline in office.  Patient continues to work on lifestyle modifications, primarily with regular aerobic exercise most days of the week.  He has been checking his blood pressure at home and reports that readings have been slightly better than that initially measured in office here.  Reported issues with headaches or chest pain.  He did have a couple episodes recently, 1 of which involved dizziness and the other where he did briefly pass out.  In regards to syncopal episode, this occurred when he had been in a hot tub.  He got out of hot tub and was sitting on the edge when he passed out.  He does admit that that day he had a moderate amount of wine as they were on vacation.  Another time, patient had finished Peloton ride in the morning when he had not eaten or drank much to start the day.  He developed some dizziness with that episode. On exam, vital signs stable, blood pressure elevated in office today.  Heart with regular rate and rhythm, no murmur appreciated. Suspect that above episodes were related to alcohol intake and exposure to high heat with hot tub and moderate level of exertion without eating or drinking.  Do not feel that other underlying heart condition precipitated events.  EKG completed in the office today was reassuring with sinus rhythm, very slight bradycardia.  Normal-appearing QRS complexes and no T wave inversions. Recommend continuing with lifestyle modifications, monitoring blood pressure intermittently at home.  Advised on bringing home blood pressure cuff to next appointment to compare to our reading in the office here.

## 2022-11-08 NOTE — Patient Instructions (Signed)

## 2022-11-08 NOTE — Assessment & Plan Note (Signed)
Patient continues with escitalopram, feels that current dose has been very helpful in regards to controlling symptoms.  He denies any concerns today. We can continue with current dose of medication, no changes to be made today.

## 2022-11-08 NOTE — Progress Notes (Signed)
    Procedures performed today:    None.  Independent interpretation of notes and tests performed by another provider:   None.  Brief History, Exam, Impression, and Recommendations:    BP (!) 140/100 (BP Location: Right Arm, Patient Position: Sitting, Cuff Size: Normal)   Pulse (!) 56   Ht 6\' 5"  (1.956 m)   Wt 240 lb 12.8 oz (109.2 kg)   SpO2 100%   BMI 28.55 kg/m   Elevated blood pressure reading in office without diagnosis of hypertension Blood pressure remains borderline in office.  Patient continues to work on lifestyle modifications, primarily with regular aerobic exercise most days of the week.  He has been checking his blood pressure at home and reports that readings have been slightly better than that initially measured in office here.  Reported issues with headaches or chest pain.  He did have a couple episodes recently, 1 of which involved dizziness and the other where he did briefly pass out.  In regards to syncopal episode, this occurred when he had been in a hot tub.  He got out of hot tub and was sitting on the edge when he passed out.  He does admit that that day he had a moderate amount of wine as they were on vacation.  Another time, patient had finished Peloton ride in the morning when he had not eaten or drank much to start the day.  He developed some dizziness with that episode. On exam, vital signs stable, blood pressure elevated in office today.  Heart with regular rate and rhythm, no murmur appreciated. Suspect that above episodes were related to alcohol intake and exposure to high heat with hot tub and moderate level of exertion without eating or drinking.  Do not feel that other underlying heart condition precipitated events.  EKG completed in the office today was reassuring with sinus rhythm, very slight bradycardia.  Normal-appearing QRS complexes and no T wave inversions. Recommend continuing with lifestyle modifications, monitoring blood pressure intermittently at  home.  Advised on bringing home blood pressure cuff to next appointment to compare to our reading in the office here.  Generalized anxiety disorder Patient continues with escitalopram, feels that current dose has been very helpful in regards to controlling symptoms.  He denies any concerns today. We can continue with current dose of medication, no changes to be made today.  Return in about 3 months (around 02/08/2023) for BP.   ___________________________________________ Phyllistine Domingos de [place], MD, ABFM, [facility]

## 2022-11-10 DIAGNOSIS — F411 Generalized anxiety disorder: Secondary | ICD-10-CM | POA: Diagnosis not present

## 2022-11-15 ENCOUNTER — Other Ambulatory Visit (HOSPITAL_BASED_OUTPATIENT_CLINIC_OR_DEPARTMENT_OTHER): Payer: Self-pay | Admitting: Family Medicine

## 2022-11-15 ENCOUNTER — Other Ambulatory Visit (HOSPITAL_COMMUNITY): Payer: Self-pay

## 2022-11-15 DIAGNOSIS — K219 Gastro-esophageal reflux disease without esophagitis: Secondary | ICD-10-CM

## 2022-11-15 MED ORDER — PANTOPRAZOLE SODIUM 40 MG PO TBEC
40.0000 mg | DELAYED_RELEASE_TABLET | Freq: Every day | ORAL | 1 refills | Status: DC
Start: 2022-11-15 — End: 2023-05-24
  Filled 2022-11-15: qty 90, 90d supply, fill #0
  Filled 2023-02-22: qty 90, 90d supply, fill #1

## 2022-11-16 ENCOUNTER — Other Ambulatory Visit (HOSPITAL_COMMUNITY): Payer: Self-pay

## 2022-11-16 ENCOUNTER — Other Ambulatory Visit: Payer: Self-pay | Admitting: Internal Medicine

## 2022-11-16 ENCOUNTER — Other Ambulatory Visit: Payer: Self-pay

## 2022-11-16 DIAGNOSIS — M452 Ankylosing spondylitis of cervical region: Secondary | ICD-10-CM

## 2022-11-16 DIAGNOSIS — L409 Psoriasis, unspecified: Secondary | ICD-10-CM

## 2022-11-16 DIAGNOSIS — Z79899 Other long term (current) drug therapy: Secondary | ICD-10-CM

## 2022-11-16 MED ORDER — ENBREL MINI 50 MG/ML ~~LOC~~ SOCT
50.0000 mg | SUBCUTANEOUS | 0 refills | Status: DC
Start: 2022-11-16 — End: 2022-12-15
  Filled 2022-11-16 – 2022-11-18 (×2): qty 4, 28d supply, fill #0

## 2022-11-16 NOTE — Telephone Encounter (Signed)
Last Fill: 08/10/2022  Labs: 08/10/2022 CRP is normal. His AST is mildly elevated at 47 up from 29 and 84 up from 49. Enbrel can rarely affect liver function but could also be incidental. These are pretty small changes so I think it is okay to just monitor for now, if there is a trend in follow up might need to look into it.   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   Patent due to update labs at upcomming office visit.   Okay to refill Enbrel?

## 2022-11-17 DIAGNOSIS — F411 Generalized anxiety disorder: Secondary | ICD-10-CM | POA: Diagnosis not present

## 2022-11-18 ENCOUNTER — Other Ambulatory Visit (HOSPITAL_COMMUNITY): Payer: Self-pay

## 2022-11-18 ENCOUNTER — Other Ambulatory Visit: Payer: Self-pay

## 2022-11-19 ENCOUNTER — Other Ambulatory Visit (HOSPITAL_COMMUNITY): Payer: Self-pay

## 2022-11-23 ENCOUNTER — Other Ambulatory Visit (HOSPITAL_COMMUNITY): Payer: Self-pay

## 2022-11-24 NOTE — Progress Notes (Unsigned)
Office Visit Note  Patient: Luis Khan             Date of Birth: March 01, 1986           MRN: [id number]             PCP: de Peru, Raymond J, MD Referring: de Peru, Raymond J, MD Visit Date: 11/25/2022   Subjective:  No chief complaint on file.   History of Present Illness: Luis Khan is a 37 y.o. male here for follow up ***   Previous HPI 08/10/22 Luis Khan is a 37 y.o. male here for follow up for axial spondylarthritis on Enbrel 50 mg subcu weekly.  So far since her last visit his arthritis is doing well no recurrent episodes of large knee effusion or requiring intra-articular steroid injections.  He increased his physical exercise now working out up to 5 days a week get some typical muscle soreness with this but no flareup of joint pain.  Feels his flexibility is getting better.  He is motivated due to worsening report on his blood pressure at PCP clinic follow-up though did not start any medications he is working on lifestyle.  Skin rash is doing well on his extremities but is noticing itchy rash on the center of the chest.  This bothers him usually for short time in the morning on a daily basis but is not severe he has not noticed any particular expansion over time.   Previous HPI 05/05/22 Luis Khan is a 37 y.o. male here for follow up for axial spondyloarthritis after starting Enbrel 50 mg Harrison weekly. He has seen a large improvement in symptoms started since about 2 or 3 weeks in and has done well since then. Morning stiffness is now minimal. Flexibility in hips and knees improved. Still taking aleve routine twice daily. Skin rashes are also largely but not completely better on scalp, arms, and hips.   Previous HPI 02/10/22 Luis Khan is a 37 y.o. male here for follow up for evaluation of recurrent left knee effusion and joint pains in shoulders, back, and hips. Labs at initial visit positive for HLA-B27 and elevated ESR and CRP.  Since her last visit he had  a left knee steroid injection with Dr. Pearletha Forge with improvement in symptoms as he needs to be functional for an upcoming golf event this weekend.  Otherwise symptoms remain about the same he is still requiring maximal dose of naproxen to mostly control symptoms.   Previous HPI 01/26/2022 Luis Khan is a 37 y.o. male here for evaluation of recurrent left knee effusion also joint pains increased in multiple areas. He has been noticing new or increased symptoms since last summer with left knee effusion and restricted range of motion. He also had ongoing right knee pain with patellar tendonitis.  Imaging without significant OA changes and knee aspiration with moderate WBC increase otherwise bland. The knee effusion remained improved for months and he participated in PT for several months with a good benefit in his symptoms. However over time and especially since finishing PT symptoms are worsening again now including mid back pain, shoulders, hips, and knee pains. His back pain is most severe at night and first thing in the mornings. He sees a large but incomplete improvement with taking 2 tablets of naproxen, most effective at night time to reduce morning symptoms. He had recurrence of left knee effusion and aspiration by Dr. Pearletha Forge with findings of elevated cell count both  RBCs and WBCs 7398. Previous aspiration from a year ago with elevated WBCs 4067. He does have a history of skin psoriasis, referred to new dermatology last October for increase in symptoms and he is controlling this with topical clobetasol. He has chronic GI symptoms attributed to IBS and also history of celiac disease. Currently these symptoms are at about normal baseline. He was also seeing his eye doctor recent with recurrent styes or hordeolum, but no history of iritis or uveitis known.    Labs reviewed 02/2021 RF neg ANA neg ESR 4 CRP 5   Imaging reviewed 02/10/2021 MRI Left knee IMPRESSION: Focal mild bony edema within  the medial aspect of the mid patella, near the MPFL/medial retinacular attachment. The MPFL/medial retinaculum appear intact. This could represent a focal bony contusion or traction injury.   Moderate-sized joint effusion.   No evidence of meniscus tear.   No Rheumatology ROS completed.   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   Low back pain 01/26/2022   High risk medication use 01/26/2022   Annual physical exam 07/21/2021   Acute sinusitis, unspecified 05/01/2021   Environmental and seasonal allergies 03/17/2021   Psoriasis 03/17/2021   FCU (flexor carpi ulnaris) tenosynovitis 03/04/2021   Knee pain, chronic 03/04/2021   Sudden visual loss, right eye 02/17/2021   Hordeolum externum of right upper eyelid 07/19/2018   Gastroesophageal reflux disease 04/09/2015    Past Medical History:  Diagnosis Date   Allergy    Anxiety    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   Blood transfusion without reported diagnosis    Chronic allergic rhinitis    Flat feet, bilateral    GERD (gastroesophageal reflux disease) 10/03/2014   NASH (nonalcoholic steatohepatitis)    Patellar tendinitis    Right Knee   Premature labor, onset of delivery before 37 weeks of gestation, fetus 5 07/09/2015   Psoriasis 09/2814    Family History  Problem Relation Age of Onset   Irritable bowel syndrome Mother    Kidney Stones Mother    Hypertension Father    Gallstones Father    Hyperlipidemia Father    Gallbladder disease Maternal Aunt    Kidney cancer Maternal Aunt    Diabetes Mellitus II Maternal Uncle    Diabetes Maternal Uncle    Pancreatic cancer Maternal  Grandmother    Pancreatic disease Maternal Grandmother    Cancer Maternal Grandmother    Dementia Maternal Grandfather    Heart attack Maternal Grandfather    Gallstones Paternal Grandmother    Drug abuse Paternal Grandmother    Hypertension Paternal Grandmother    Kidney Stones Paternal Grandmother    Congestive Heart Failure Paternal Grandfather    Coronary artery disease Paternal Grandfather    Heart attack Paternal Grandfather    Diabetes Paternal Grandfather    Healthy Daughter    Past Surgical History:  Procedure Laterality Date   CHOLECYSTECTOMY N/A 07/23/2016   Procedure: LAPAROSCOPIC CHOLECYSTECTOMY WITH INTRAOPERATIVE CHOLANGIOGRAM;  Surgeon: Nadeen Landau, MD;  Location: [facility] ORS;  Service: General;  Laterality: N/A;   ESOPHAGOGASTRODUODENOSCOPY (EGD) WITH PROPOFOL N/A 08/01/2015   Procedure: ESOPHAGOGASTRODUODENOSCOPY (EGD) WITH PROPOFOL;  Surgeon: Christena Deem, MD;  Location: [facility] ENDOSCOPY;  Service: Endoscopy;  Laterality: N/A;   WISDOM TOOTH EXTRACTION     Social History   Social History Narrative   Not on file   Immunization History  Administered Date(s) Administered   Influenza,inj,Quad PF,6+ Mos 04/03/2019, 03/21/2022   Influenza-Unspecified 03/02/2018, 02/27/2020, 04/03/2021   Tdap 01/31/2018     Objective: Vital Signs: There were no vitals taken for this visit.   Physical Exam   Musculoskeletal Exam: ***  CDAI Exam: CDAI Score: -- Patient Global: --; Provider Global: -- Swollen: --; Tender: -- Joint Exam 11/25/2022   No joint exam has been documented for this visit   There is currently no information documented on the homunculus. Go to the Rheumatology activity and complete the homunculus joint exam.  Investigation: No additional findings.  Imaging: No results found.  Recent Labs: Lab Results  Component Value Date   WBC 3.9 08/10/2022   HGB 15.7 08/10/2022   PLT 216 08/10/2022   NA 139 08/10/2022   K 4.4 08/10/2022   CL  104 08/10/2022   CO2 29 08/10/2022   GLUCOSE 90 08/10/2022   BUN 14 08/10/2022   CREATININE 0.87 08/10/2022   BILITOT 1.2 08/10/2022   ALKPHOS 70 07/15/2021   AST 47 (H) 08/10/2022   ALT 84 (H) 08/10/2022   PROT 7.1 08/10/2022   ALBUMIN 4.8 07/15/2021   CALCIUM 9.1 08/10/2022   GFRAA 107 12/26/2018   QFTBGOLDPLUS NEGATIVE 01/26/2022    Speciality Comments: No specialty comments available.  Procedures:  No procedures performed Allergies: Augmentin [amoxicillin-pot clavulanate], Cephalexin, and Z-pak [azithromycin]   Assessment / Plan:     Visit Diagnoses: No diagnosis found.  ***  Orders: No orders of the defined types were placed in this encounter.  No orders of the defined types were placed in this encounter.    Follow-Up Instructions: No follow-ups on file.   Fuller Plan, MD  Note - This record has been created using [employer].  Chart creation errors have been sought, but may not always  have been located. Such creation errors do not reflect on  the standard of medical care.

## 2022-11-25 ENCOUNTER — Ambulatory Visit: Payer: Commercial Managed Care - PPO | Attending: Internal Medicine | Admitting: Internal Medicine

## 2022-11-25 ENCOUNTER — Encounter: Payer: Self-pay | Admitting: Internal Medicine

## 2022-11-25 VITALS — BP 136/88 | HR 58 | Resp 12 | Ht 77.0 in | Wt 241.0 lb

## 2022-11-25 DIAGNOSIS — R55 Syncope and collapse: Secondary | ICD-10-CM

## 2022-11-25 DIAGNOSIS — M452 Ankylosing spondylitis of cervical region: Secondary | ICD-10-CM

## 2022-11-25 DIAGNOSIS — Z79899 Other long term (current) drug therapy: Secondary | ICD-10-CM

## 2022-11-25 DIAGNOSIS — L409 Psoriasis, unspecified: Secondary | ICD-10-CM | POA: Diagnosis not present

## 2022-11-25 LAB — CBC WITH DIFFERENTIAL/PLATELET
Basophils Relative: 0.4 %
Total Lymphocyte: 39 %

## 2022-11-25 LAB — COMPLETE METABOLIC PANEL WITH GFR
AG Ratio: 1.6 (calc) (ref 1.0–2.5)
Sodium: 141 mmol/L (ref 135–146)

## 2022-11-26 LAB — COMPLETE METABOLIC PANEL WITH GFR
ALT: 53 U/L — ABNORMAL HIGH (ref 9–46)
AST: 32 U/L (ref 10–40)
Albumin: 4.7 g/dL (ref 3.6–5.1)
CO2: 31 mmol/L (ref 20–32)
Calcium: 9.7 mg/dL (ref 8.6–10.3)
Chloride: 104 mmol/L (ref 98–110)
Creat: 0.98 mg/dL (ref 0.60–1.26)
Globulin: 2.9 g/dL (calc) (ref 1.9–3.7)
Potassium: 4.7 mmol/L (ref 3.5–5.3)
Total Protein: 7.6 g/dL (ref 6.1–8.1)

## 2022-11-26 LAB — CBC WITH DIFFERENTIAL/PLATELET
Eosinophils Relative: 2.4 %
MCHC: 34 g/dL (ref 32.0–36.0)
MCV: 92.3 fL (ref 80.0–100.0)
Monocytes Relative: 8.4 %
RBC: 5.06 10*6/uL (ref 4.20–5.80)

## 2022-11-27 LAB — CBC WITH DIFFERENTIAL/PLATELET
Absolute Monocytes: 386 cells/uL (ref 200–950)
Basophils Absolute: 18 cells/uL (ref 0–200)
Eosinophils Absolute: 110 cells/uL (ref 15–500)
HCT: 46.7 % (ref 38.5–50.0)
Hemoglobin: 15.9 g/dL (ref 13.2–17.1)
Lymphs Abs: 1794 cells/uL (ref 850–3900)
MCH: 31.4 pg (ref 27.0–33.0)
MPV: 10.3 fL (ref 7.5–12.5)
Neutro Abs: 2291 cells/uL (ref 1500–7800)
Neutrophils Relative %: 49.8 %
Platelets: 219 10*3/uL (ref 140–400)
RDW: 12.9 % (ref 11.0–15.0)
WBC: 4.6 10*3/uL (ref 3.8–10.8)

## 2022-11-27 LAB — COMPLETE METABOLIC PANEL WITH GFR
Alkaline phosphatase (APISO): 54 U/L (ref 36–130)
BUN: 13 mg/dL (ref 7–25)
Glucose, Bld: 99 mg/dL (ref 65–99)
Total Bilirubin: 1 mg/dL (ref 0.2–1.2)
eGFR: 101 mL/min/{1.73_m2} (ref >=60)

## 2022-11-27 LAB — QUANTIFERON-TB GOLD PLUS
Mitogen-NIL: 8.65 IU/mL
NIL: 0.01 IU/mL
QuantiFERON-TB Gold Plus: NEGATIVE
TB1-NIL: 0 IU/mL
TB2-NIL: 0.01 IU/mL

## 2022-11-27 LAB — C-REACTIVE PROTEIN: CRP: <3 mg/L (ref <8.0)

## 2022-11-29 ENCOUNTER — Ambulatory Visit (HOSPITAL_BASED_OUTPATIENT_CLINIC_OR_DEPARTMENT_OTHER): Payer: Commercial Managed Care - PPO | Admitting: Family Medicine

## 2022-12-01 DIAGNOSIS — F411 Generalized anxiety disorder: Secondary | ICD-10-CM | POA: Diagnosis not present

## 2022-12-06 ENCOUNTER — Encounter (HOSPITAL_BASED_OUTPATIENT_CLINIC_OR_DEPARTMENT_OTHER): Payer: Self-pay | Admitting: Family Medicine

## 2022-12-06 NOTE — Telephone Encounter (Signed)
Mychart message sent by pt:  Luis Carney "Luis"  [name] (supporting Hosie Poisson Peru, MD)15 minutes ago (10:39 AM)    Hey Dr. De Peru,   [facility] all is well for you! While I am not out of my Lexapro quite yet, I am certainly getting low and close to a refill. When I picked my prescription up the last time over at the [facility], they suggested having you authorize the script for a 90 day refill instead of 45 to save money with my insurance plan.    Is that something you're able to do with this next refill?   Thanks so much,   Luis Khan   Dr. De Peru, please advise on this for pt.

## 2022-12-07 ENCOUNTER — Other Ambulatory Visit (HOSPITAL_COMMUNITY): Payer: Self-pay

## 2022-12-07 MED ORDER — ESCITALOPRAM OXALATE 5 MG PO TABS
5.0000 mg | ORAL_TABLET | Freq: Every day | ORAL | 1 refills | Status: DC
  Filled 2022-12-07: qty 90, 90d supply, fill #0
  Filled 2023-02-22: qty 90, 90d supply, fill #1

## 2022-12-08 DIAGNOSIS — F411 Generalized anxiety disorder: Secondary | ICD-10-CM | POA: Diagnosis not present

## 2022-12-15 ENCOUNTER — Other Ambulatory Visit (HOSPITAL_COMMUNITY): Payer: Self-pay

## 2022-12-15 ENCOUNTER — Other Ambulatory Visit: Payer: Self-pay

## 2022-12-15 ENCOUNTER — Other Ambulatory Visit: Payer: Self-pay | Admitting: Internal Medicine

## 2022-12-15 DIAGNOSIS — M452 Ankylosing spondylitis of cervical region: Secondary | ICD-10-CM

## 2022-12-15 DIAGNOSIS — Z79899 Other long term (current) drug therapy: Secondary | ICD-10-CM

## 2022-12-15 DIAGNOSIS — L409 Psoriasis, unspecified: Secondary | ICD-10-CM

## 2022-12-15 MED ORDER — ENBREL MINI 50 MG/ML ~~LOC~~ SOCT
50.0000 mg | SUBCUTANEOUS | 2 refills | Status: DC
Start: 2022-12-15 — End: 2023-02-23
  Filled 2022-12-15: qty 4, 28d supply, fill #0
  Filled 2023-01-17: qty 4, 28d supply, fill #1
  Filled 2023-02-14: qty 4, 28d supply, fill #2

## 2022-12-15 NOTE — Telephone Encounter (Signed)
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

## 2022-12-21 ENCOUNTER — Other Ambulatory Visit (HOSPITAL_COMMUNITY): Payer: Self-pay

## 2022-12-22 DIAGNOSIS — F411 Generalized anxiety disorder: Secondary | ICD-10-CM | POA: Diagnosis not present

## 2022-12-29 DIAGNOSIS — F411 Generalized anxiety disorder: Secondary | ICD-10-CM | POA: Diagnosis not present

## 2023-01-03 ENCOUNTER — Other Ambulatory Visit: Payer: Self-pay | Admitting: Oncology

## 2023-01-03 DIAGNOSIS — Z006 Encounter for examination for normal comparison and control in clinical research program: Secondary | ICD-10-CM

## 2023-01-06 DIAGNOSIS — F411 Generalized anxiety disorder: Secondary | ICD-10-CM | POA: Diagnosis not present

## 2023-01-17 ENCOUNTER — Other Ambulatory Visit (HOSPITAL_COMMUNITY): Payer: Self-pay

## 2023-01-17 DIAGNOSIS — F411 Generalized anxiety disorder: Secondary | ICD-10-CM | POA: Diagnosis not present

## 2023-01-18 ENCOUNTER — Other Ambulatory Visit: Payer: Self-pay

## 2023-01-26 DIAGNOSIS — F411 Generalized anxiety disorder: Secondary | ICD-10-CM | POA: Diagnosis not present

## 2023-02-03 DIAGNOSIS — F411 Generalized anxiety disorder: Secondary | ICD-10-CM | POA: Diagnosis not present

## 2023-02-10 NOTE — Progress Notes (Signed)
Office Visit Note  Patient: Luis Khan             Date of Birth: [birth year]           MRN: [id number]             PCP: de Peru, Raymond J, MD Referring: de Peru, Raymond J, MD Visit Date: 02/23/2023   Subjective:  No chief complaint on file.   History of Present Illness: Luis Khan is a 37 y.o. 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  Feels his arthritis and psoriasis are doing very well.  Does not have any prolonged morning stiffness or requiring over-the-counter medications for joint pain.  Still 19 small area of rashes on the left upper thigh.  He also developed a new area with a frequently raised bump behind the left ear coming and going intermittently improves with topical steroids.  He had a few episodes of positional dizziness usually occur after prolonged time without eating or after having a few drinks.  Blood pressure has been better controlled remains mildly hypertensive.  Previous HPI 11/25/2022 Luis Khan is a 37 y.o. 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 Since last visit his joint symptoms are doing well. He is physically active doing peloton about 5 days per week. He was sick with a persistent sinus infection treated with oral antibiotics in the first half of May. He suffered a syncopal episode with fall and some bruising on hip and elbow while vacationing in [place] later in May. Was associated with prolonged time in hot bath water and alcohol use maybe some dehydration. No repeat episodes. He had some bumpy, erythematous rash on the central chest after a long day at the [place] [employer] this resolved fully. Psoriasis rashes have not returned.   Previous HPI 08/10/22 Luis Khan is a 37 y.o. male here for follow up for axial spondylarthritis on Enbrel 50 mg subcu weekly.  So far since her last visit his arthritis is doing well no recurrent episodes of large knee effusion or  requiring intra-articular steroid injections.  He increased his physical exercise now working out up to 5 days a week get some typical muscle soreness with this but no flareup of joint pain.  Feels his flexibility is getting better.  He is motivated due to worsening report on his blood pressure at PCP clinic follow-up though did not start any medications he is working on lifestyle.  Skin rash is doing well on his extremities but is noticing itchy rash on the center of the chest.  This bothers him usually for short time in the morning on a daily basis but is not severe he has not noticed any particular expansion over time.   Previous HPI 05/05/22 Luis Khan is a 37 y.o. male here for follow up for axial spondyloarthritis after starting Enbrel 50 mg [place] weekly. He has seen a large improvement in symptoms started since about 2 or 3 weeks in and has done well since then. Morning stiffness is now minimal. Flexibility in hips and knees improved. Still taking aleve routine twice daily. Skin rashes are also largely but not completely better on scalp, arms, and hips.   Previous HPI 02/10/22 Luis Khan is a 37 y.o. male here for follow up for evaluation of recurrent left knee effusion and joint pains in shoulders, back, and hips. Labs at initial visit positive for  HLA-B27 and elevated ESR and CRP.  Since her last visit he had a left knee steroid injection with Dr. Pearletha Forge with improvement in symptoms as he needs to be functional for an upcoming golf event this weekend.  Otherwise symptoms remain about the same he is still requiring maximal dose of naproxen to mostly control symptoms.   Previous HPI 01/26/2022 Luis Khan is a 37 y.o. male here for evaluation of recurrent left knee effusion also joint pains increased in multiple areas. He has been noticing new or increased symptoms since last summer with left knee effusion and restricted range of motion. He also had ongoing right knee pain with  patellar tendonitis.  Imaging without significant OA changes and knee aspiration with moderate WBC increase otherwise bland. The knee effusion remained improved for months and he participated in PT for several months with a good benefit in his symptoms. However over time and especially since finishing PT symptoms are worsening again now including mid back pain, shoulders, hips, and knee pains. His back pain is most severe at night and first thing in the mornings. He sees a large but incomplete improvement with taking 2 tablets of naproxen, most effective at night time to reduce morning symptoms. He had recurrence of left knee effusion and aspiration by Dr. Pearletha Forge with findings of elevated cell count both RBCs and WBCs 7398. Previous aspiration from a year ago with elevated WBCs 4067. He does have a history of skin psoriasis, referred to new dermatology last October for increase in symptoms and he is controlling this with topical clobetasol. He has chronic GI symptoms attributed to IBS and also history of celiac disease. Currently these symptoms are at about normal baseline. He was also seeing his eye doctor recent with recurrent styes or hordeolum, but no history of iritis or uveitis known.    Labs reviewed 02/2021 RF neg ANA neg ESR 4 CRP 5   Imaging reviewed 02/10/2021 MRI Left knee IMPRESSION: Focal mild bony edema within the medial aspect of the mid patella, near the MPFL/medial retinacular attachment. The MPFL/medial retinaculum appear intact. This could represent a focal bony contusion or traction injury.   Moderate-sized joint effusion.   No evidence of meniscus tear.   Review of Systems  Constitutional:  Positive for fatigue.  HENT:  Negative for mouth sores and mouth dryness.   Eyes:  Positive for dryness.  Respiratory:  Negative for shortness of breath.   Cardiovascular:  Negative for chest pain and palpitations.  Gastrointestinal:  Negative for blood in stool, constipation and  diarrhea.  Endocrine: Negative for increased urination.  Genitourinary:  Negative for involuntary urination.  Musculoskeletal:  Negative for joint pain, gait problem, joint pain, joint swelling, myalgias, muscle weakness, morning stiffness, muscle tenderness and myalgias.  Skin:  Positive for rash. Negative for color change, hair loss and sensitivity to sunlight.  Allergic/Immunologic: Negative for susceptible to infections.  Neurological:  Positive for dizziness. Negative for headaches.  Hematological:  Negative for swollen glands.  Psychiatric/Behavioral:  Negative for depressed mood and sleep disturbance. The patient is not nervous/anxious.     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   Low back pain 01/26/2022   High risk medication use 01/26/2022   Annual physical exam 07/21/2021   Environmental  and seasonal allergies 03/17/2021   Psoriasis 03/17/2021   FCU (flexor carpi ulnaris) tenosynovitis 03/04/2021   Knee pain, chronic 03/04/2021   Allergy, unspecified, initial encounter 02/17/2021   Gastroesophageal reflux disease 04/09/2015    Past Medical History:  Diagnosis Date   Allergy    Anxiety    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   Blood transfusion without reported diagnosis    Chronic allergic rhinitis    Flat feet, bilateral    GERD (gastroesophageal reflux disease) 10/03/2014   NASH (nonalcoholic steatohepatitis)    Patellar tendinitis    Right Knee   Premature labor, onset of delivery before 37 weeks of gestation, fetus 5 07/09/2015   Psoriasis 09/2814    Family History  Problem Relation Age of Onset   Irritable bowel syndrome Mother    Kidney Stones Mother     Hypertension Father    Gallstones Father    Hyperlipidemia Father    Pancreatic cancer Maternal Grandmother    Pancreatic disease Maternal Grandmother    Cancer Maternal Grandmother    Other Maternal Grandmother        Disk fusion   Dementia Maternal Grandfather    Heart attack Maternal Grandfather    Gallstones Paternal Grandmother    Drug abuse Paternal Grandmother    Hypertension Paternal Grandmother    Kidney Stones Paternal Grandmother    Congestive Heart Failure Paternal Grandfather    Coronary artery disease Paternal Grandfather    Heart attack Paternal Grandfather    Diabetes Paternal Grandfather    Healthy Daughter    Gallbladder disease Maternal Aunt    Kidney Stones Maternal Aunt    Diabetes Mellitus II Maternal Uncle    Past Surgical History:  Procedure Laterality Date   CHOLECYSTECTOMY N/A 07/23/2016   Procedure: LAPAROSCOPIC CHOLECYSTECTOMY WITH INTRAOPERATIVE CHOLANGIOGRAM;  Surgeon: Nadeen Landau, MD;  Location: [facility] ORS;  Service: General;  Laterality: N/A;   ESOPHAGOGASTRODUODENOSCOPY (EGD) WITH PROPOFOL N/A 08/01/2015   Procedure: ESOPHAGOGASTRODUODENOSCOPY (EGD) WITH PROPOFOL;  Surgeon: Christena Deem, MD;  Location: [facility] ENDOSCOPY;  Service: Endoscopy;  Laterality: N/A;   WISDOM TOOTH EXTRACTION     Social History   Social History Narrative   Not on file   Immunization History  Administered Date(s) Administered   Influenza, Seasonal, Injecte, Preservative Fre 02/11/2023   Influenza,inj,Quad PF,6+ Mos 04/03/2019, 03/21/2022   Influenza-Unspecified 03/02/2018, 02/27/2020, 04/03/2021   Tdap 01/31/2018     Objective: Vital Signs: BP (!) 134/90 (BP Location: Left Arm, Patient Position: Sitting, Cuff Size: Normal)   Pulse 64   Resp 17   Ht 6\' 4"  (1.93 m)   Wt 242 lb 12.8 oz (110.1 kg)   BMI 29.55 kg/m    Physical Exam Eyes:     Conjunctiva/sclera: Conjunctivae normal.  Cardiovascular:     Rate and Rhythm: Normal rate and regular  rhythm.  Pulmonary:     Effort: Pulmonary effort is normal.     Breath sounds: Normal breath sounds.  Musculoskeletal:     Right lower leg: No edema.     Left lower leg: No edema.  Skin:    General: Skin is warm and dry.     Findings: No rash.  Neurological:     Mental Status: He is alert.  Psychiatric:        Mood and Affect: Mood normal.      Musculoskeletal Exam:  Shoulders full ROM no tenderness or swelling Elbows full ROM no tenderness or swelling Wrists full  ROM no tenderness or swelling Fingers full ROM no tenderness or swelling Left hip internal rotation slightly restricted compared to right, no lateral tenderness no radiation Knees full ROM no tenderness or swelling Ankles full ROM no tenderness or swelling   Investigation: No additional findings.  Imaging: No results found.  Recent Labs: Lab Results  Component Value Date   WBC 4.6 11/25/2022   HGB 15.9 11/25/2022   PLT 219 11/25/2022   NA 141 11/25/2022   K 4.7 11/25/2022   CL 104 11/25/2022   CO2 31 11/25/2022   GLUCOSE 99 11/25/2022   BUN 13 11/25/2022   CREATININE 0.98 11/25/2022   BILITOT 1.0 11/25/2022   ALKPHOS 70 07/15/2021   AST 32 11/25/2022   ALT 53 (H) 11/25/2022   PROT 7.6 11/25/2022   ALBUMIN 4.8 07/15/2021   CALCIUM 9.7 11/25/2022   GFRAA 107 12/26/2018   QFTBGOLDPLUS NEGATIVE 11/25/2022    Speciality Comments: No specialty comments available.  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  Checking CRP for disease activity monitoring.  Plan to continue Enbrel 50 mg subcu weekly.  Vasovagal syncope  No new syncopal  episodes some transient dizziness.  Possibly orthostatic or may be dehydration related.  He is not on any antihypertensive medications.  No red flag symptoms.  High risk medication use - Plan: CBC with Differential/Platelet, COMPLETE METABOLIC PANEL WITH GFR, etanercept (ENBREL MINI) 50 MG/ML injection  Checking CBC and CMP for medication monitoring on continued long-term use of Enbrel.  Previously had elevated LFTs decrease ALT down to 53 last time which appears to be around baseline and is near normal with history of fatty liver disease.  No serious interval infections.  If there are no new complications can change follow-up interval to 6 months with low disease activity.  Psoriasis -  Skin and very low disease activity.  Can continue as needed topical steroid if these become itchy or problematic.  Orders: Orders Placed This Encounter  Procedures   C-reactive protein   CBC with Differential/Platelet   COMPLETE METABOLIC PANEL WITH GFR   Meds ordered this encounter  Medications   etanercept (ENBREL MINI) 50 MG/ML injection    Sig: Inject 50 mg into the skin once a week.    Dispense:  4 mL    Refill:  5    Copay card: ID: [id number]; BIN: [id number]; PCN: [id number]; Group: [id number]     Follow-Up Instructions: Return in about 6 months (around 08/23/2023) for AS/PsA on ENB f/u 6mos.   Fuller Plan, MD  Note - This record has been created using [employer].  Chart creation errors have been sought, but may not always  have been located. Such creation errors do not reflect on  the standard of medical care.

## 2023-02-11 ENCOUNTER — Encounter (HOSPITAL_BASED_OUTPATIENT_CLINIC_OR_DEPARTMENT_OTHER): Payer: Self-pay | Admitting: Family Medicine

## 2023-02-11 ENCOUNTER — Ambulatory Visit (HOSPITAL_BASED_OUTPATIENT_CLINIC_OR_DEPARTMENT_OTHER): Payer: Commercial Managed Care - PPO | Admitting: Family Medicine

## 2023-02-11 VITALS — BP 125/94 | HR 61 | Ht 77.0 in | Wt 239.5 lb

## 2023-02-11 DIAGNOSIS — Z0000 Encounter for general adult medical examination without abnormal findings: Secondary | ICD-10-CM | POA: Diagnosis not present

## 2023-02-11 DIAGNOSIS — R030 Elevated blood-pressure reading, without diagnosis of hypertension: Secondary | ICD-10-CM | POA: Diagnosis not present

## 2023-02-11 DIAGNOSIS — Z23 Encounter for immunization: Secondary | ICD-10-CM

## 2023-02-11 NOTE — Addendum Note (Signed)
Addended by: DE Peru, Veronika Heard J on: 02/11/2023 09:32 AM   Modules accepted: Orders

## 2023-02-11 NOTE — Assessment & Plan Note (Signed)
Patient does have a blood pressure log with him today.  Blood pressure has generally been improving since last office visit.  Most recent readings have been systolic of 130s, diastolic of 80s.  Patient has not had any symptoms of chest pain, headaches, lightheadedness, dizziness, presyncopal symptoms.  He does feel that he has not been able to focus as much on lifestyle modifications due to ongoing medical issues with his wife, increased stress. In office today, blood pressure with normal systolic reading, slightly elevated diastolic reading. Given general improvement in blood pressure on home readings since last office visit, feel it would be reasonable to continue with lifestyle modifications, focusing on regular aerobic physical activity, DASH diet We will plan for follow-up in about 4 to 6 months for physical.  Recommend continue to monitor blood pressure intermittently.  Recommend return to the office should any issues with blood pressure arise

## 2023-02-11 NOTE — Progress Notes (Signed)
    Procedures performed today:    None.  Independent interpretation of notes and tests performed by another provider:   None.  Brief History, Exam, Impression, and Recommendations:    BP (!) 125/94   Pulse 61   Ht 6\' 5"  (1.956 m)   Wt 239 lb 8 oz (108.6 kg)   SpO2 100%   BMI 28.40 kg/m   Elevated blood pressure reading in office without diagnosis of hypertension Assessment & Plan: Patient does have a blood pressure log with him today.  Blood pressure has generally been improving since last office visit.  Most recent readings have been systolic of 130s, diastolic of 80s.  Patient has not had any symptoms of chest pain, headaches, lightheadedness, dizziness, presyncopal symptoms.  He does feel that he has not been able to focus as much on lifestyle modifications due to ongoing medical issues with his wife, increased stress. In office today, blood pressure with normal systolic reading, slightly elevated diastolic reading. Given general improvement in blood pressure on home readings since last office visit, feel it would be reasonable to continue with lifestyle modifications, focusing on regular aerobic physical activity, DASH diet We will plan for follow-up in about 4 to 6 months for physical.  Recommend continue to monitor blood pressure intermittently.  Recommend return to the office should any issues with blood pressure arise   Need for influenza vaccination  Encounter for immunization -     Flu vaccine trivalent PF, 6mos and older(Flulaval,Afluria,Fluarix,Fluzone)  Return in about 6 months (around 08/11/2023) for CPE with fasting labs 1 week prior.   ___________________________________________ Luis Baltzell de Peru, MD, ABFM, CAQSM [facility]

## 2023-02-14 ENCOUNTER — Other Ambulatory Visit (HOSPITAL_COMMUNITY): Payer: Self-pay

## 2023-02-17 DIAGNOSIS — F411 Generalized anxiety disorder: Secondary | ICD-10-CM | POA: Diagnosis not present

## 2023-02-21 ENCOUNTER — Telehealth: Payer: Self-pay

## 2023-02-21 NOTE — Telephone Encounter (Signed)
Per Rema Jasmine, current Prior Authorization is expiring.  Submitted a Prior Authorization request to [facility] for ENBREL via CoverMyMeds. Per Automated response, pt's current Berkley Harvey is active until 08/06/2023. Will update [employer].   Key: [id number]

## 2023-02-22 ENCOUNTER — Other Ambulatory Visit (HOSPITAL_COMMUNITY): Payer: Self-pay

## 2023-02-23 ENCOUNTER — Other Ambulatory Visit (HOSPITAL_COMMUNITY): Payer: Self-pay

## 2023-02-23 ENCOUNTER — Encounter: Payer: Self-pay | Admitting: Internal Medicine

## 2023-02-23 ENCOUNTER — Other Ambulatory Visit: Payer: Self-pay

## 2023-02-23 ENCOUNTER — Ambulatory Visit: Payer: Commercial Managed Care - PPO | Attending: Internal Medicine | Admitting: Internal Medicine

## 2023-02-23 VITALS — BP 134/90 | HR 64 | Resp 17 | Ht 76.0 in | Wt 242.8 lb

## 2023-02-23 DIAGNOSIS — Z79899 Other long term (current) drug therapy: Secondary | ICD-10-CM

## 2023-02-23 DIAGNOSIS — R55 Syncope and collapse: Secondary | ICD-10-CM | POA: Diagnosis not present

## 2023-02-23 DIAGNOSIS — L409 Psoriasis, unspecified: Secondary | ICD-10-CM

## 2023-02-23 DIAGNOSIS — M452 Ankylosing spondylitis of cervical region: Secondary | ICD-10-CM | POA: Diagnosis not present

## 2023-02-23 DIAGNOSIS — F411 Generalized anxiety disorder: Secondary | ICD-10-CM | POA: Diagnosis not present

## 2023-02-23 MED ORDER — ENBREL MINI 50 MG/ML ~~LOC~~ SOCT
50.0000 mg | SUBCUTANEOUS | 5 refills | Status: DC
  Filled 2023-02-23 – 2023-03-15 (×2): qty 4, 28d supply, fill #0
  Filled 2023-04-06: qty 4, 28d supply, fill #1
  Filled 2023-05-06: qty 4, 28d supply, fill #2
  Filled 2023-06-02: qty 4, 28d supply, fill #3
  Filled 2023-07-01: qty 4, 28d supply, fill #4
  Filled 2023-08-19 (×2): qty 4, 28d supply, fill #5

## 2023-02-24 ENCOUNTER — Other Ambulatory Visit: Payer: Self-pay

## 2023-02-24 ENCOUNTER — Other Ambulatory Visit (HOSPITAL_COMMUNITY): Payer: Self-pay

## 2023-02-24 ENCOUNTER — Encounter: Payer: Self-pay | Admitting: Pharmacist

## 2023-02-24 LAB — CBC WITH DIFFERENTIAL/PLATELET
Absolute Monocytes: 508 cells/uL (ref 200–950)
Basophils Absolute: 19 cells/uL (ref 0–200)
Basophils Relative: 0.3 %
Eosinophils Absolute: 130 cells/uL (ref 15–500)
Eosinophils Relative: 2.1 %
HCT: 47.5 % (ref 38.5–50.0)
Hemoglobin: 16.2 g/dL (ref 13.2–17.1)
Lymphs Abs: 2003 cells/uL (ref 850–3900)
MCH: 32.5 pg (ref 27.0–33.0)
MCHC: 34.1 g/dL (ref 32.0–36.0)
MCV: 95.2 fL (ref 80.0–100.0)
MPV: 10.4 fL (ref 7.5–12.5)
Monocytes Relative: 8.2 %
Neutro Abs: 3540 cells/uL (ref 1500–7800)
Neutrophils Relative %: 57.1 %
Platelets: 249 10*3/uL (ref 140–400)
RBC: 4.99 10*6/uL (ref 4.20–5.80)
RDW: 12.5 % (ref 11.0–15.0)
Total Lymphocyte: 32.3 %
WBC: 6.2 10*3/uL (ref 3.8–10.8)

## 2023-02-24 LAB — COMPLETE METABOLIC PANEL WITH GFR
AG Ratio: 1.8 (calc) (ref 1.0–2.5)
ALT: 44 U/L (ref 9–46)
AST: 26 U/L (ref 10–40)
Albumin: 4.7 g/dL (ref 3.6–5.1)
Alkaline phosphatase (APISO): 54 U/L (ref 36–130)
BUN: 14 mg/dL (ref 7–25)
CO2: 31 mmol/L (ref 20–32)
Calcium: 9.7 mg/dL (ref 8.6–10.3)
Chloride: 104 mmol/L (ref 98–110)
Creat: 0.95 mg/dL (ref 0.60–1.26)
Globulin: 2.6 g/dL (calc) (ref 1.9–3.7)
Glucose, Bld: 71 mg/dL (ref 65–99)
Potassium: 4.4 mmol/L (ref 3.5–5.3)
Sodium: 140 mmol/L (ref 135–146)
Total Bilirubin: 1.2 mg/dL (ref 0.2–1.2)
Total Protein: 7.3 g/dL (ref 6.1–8.1)
eGFR: 105 mL/min/{1.73_m2} (ref >=60)

## 2023-02-24 LAB — C-REACTIVE PROTEIN: CRP: <3 mg/L (ref <8.0)

## 2023-02-24 NOTE — Progress Notes (Signed)
CRP is normal. Blood count and kidney and liver function are all normal. ALT was the previously high liver enzyme it was 84 6 months ago now it is 51 which is normal. Losing some weight and staying active appears to be helping with this.

## 2023-02-28 ENCOUNTER — Ambulatory Visit: Payer: Commercial Managed Care - PPO | Admitting: Internal Medicine

## 2023-03-03 DIAGNOSIS — F411 Generalized anxiety disorder: Secondary | ICD-10-CM | POA: Diagnosis not present

## 2023-03-04 ENCOUNTER — Other Ambulatory Visit: Payer: Self-pay

## 2023-03-10 DIAGNOSIS — F411 Generalized anxiety disorder: Secondary | ICD-10-CM | POA: Diagnosis not present

## 2023-03-11 ENCOUNTER — Other Ambulatory Visit: Payer: Self-pay

## 2023-03-14 ENCOUNTER — Other Ambulatory Visit (HOSPITAL_COMMUNITY): Payer: Self-pay

## 2023-03-15 ENCOUNTER — Other Ambulatory Visit: Payer: Self-pay

## 2023-03-15 NOTE — Progress Notes (Signed)
Specialty Pharmacy Refill Coordination Note  Luis Khan is a 37 y.o. male contacted today regarding refills of specialty medication(s) Etanercept   Patient requested Delivery   Delivery date: 03/17/23   Verified address: [address], [place] [address]   Medication Luis be filled on 03/16/23.

## 2023-03-17 ENCOUNTER — Other Ambulatory Visit (HOSPITAL_COMMUNITY): Payer: Self-pay

## 2023-03-17 DIAGNOSIS — F411 Generalized anxiety disorder: Secondary | ICD-10-CM | POA: Diagnosis not present

## 2023-03-24 DIAGNOSIS — F411 Generalized anxiety disorder: Secondary | ICD-10-CM | POA: Diagnosis not present

## 2023-03-31 DIAGNOSIS — F411 Generalized anxiety disorder: Secondary | ICD-10-CM | POA: Diagnosis not present

## 2023-04-06 ENCOUNTER — Other Ambulatory Visit: Payer: Self-pay

## 2023-04-06 DIAGNOSIS — F411 Generalized anxiety disorder: Secondary | ICD-10-CM | POA: Diagnosis not present

## 2023-04-06 NOTE — Progress Notes (Signed)
Specialty Pharmacy Refill Coordination Note  Luis Khan is a 37 y.o. male contacted today regarding refills of specialty medication(s) Etanercept   Patient requested Delivery   Delivery date: 04/13/23   Verified address: [address], [place] [address]   Medication Luis be filled on 04/12/23.

## 2023-04-14 DIAGNOSIS — F411 Generalized anxiety disorder: Secondary | ICD-10-CM | POA: Diagnosis not present

## 2023-04-21 DIAGNOSIS — F411 Generalized anxiety disorder: Secondary | ICD-10-CM | POA: Diagnosis not present

## 2023-04-26 ENCOUNTER — Other Ambulatory Visit: Payer: Self-pay

## 2023-04-28 ENCOUNTER — Encounter (HOSPITAL_BASED_OUTPATIENT_CLINIC_OR_DEPARTMENT_OTHER): Payer: Self-pay | Admitting: Family Medicine

## 2023-04-28 DIAGNOSIS — F411 Generalized anxiety disorder: Secondary | ICD-10-CM | POA: Diagnosis not present

## 2023-05-03 DIAGNOSIS — F411 Generalized anxiety disorder: Secondary | ICD-10-CM | POA: Diagnosis not present

## 2023-05-06 ENCOUNTER — Other Ambulatory Visit (HOSPITAL_COMMUNITY): Payer: Self-pay | Admitting: Pharmacy Technician

## 2023-05-06 ENCOUNTER — Other Ambulatory Visit (HOSPITAL_COMMUNITY): Payer: Self-pay

## 2023-05-06 NOTE — Progress Notes (Signed)
Specialty Pharmacy Refill Coordination Note  Luis Khan Charity Ballin is a 37 y.o. male contacted today regarding refills of specialty medication(s) Etanercept   Patient requested Delivery   Delivery date: 05/11/23   Verified address: [address] [place]   Medication Luis be filled on 05/10/23.

## 2023-05-10 ENCOUNTER — Other Ambulatory Visit: Payer: Self-pay

## 2023-05-11 DIAGNOSIS — F411 Generalized anxiety disorder: Secondary | ICD-10-CM | POA: Diagnosis not present

## 2023-05-16 ENCOUNTER — Other Ambulatory Visit (HOSPITAL_COMMUNITY): Payer: Self-pay

## 2023-05-16 DIAGNOSIS — D225 Melanocytic nevi of trunk: Secondary | ICD-10-CM | POA: Diagnosis not present

## 2023-05-16 DIAGNOSIS — L821 Other seborrheic keratosis: Secondary | ICD-10-CM | POA: Diagnosis not present

## 2023-05-16 DIAGNOSIS — L400 Psoriasis vulgaris: Secondary | ICD-10-CM | POA: Diagnosis not present

## 2023-05-16 DIAGNOSIS — L814 Other melanin hyperpigmentation: Secondary | ICD-10-CM | POA: Diagnosis not present

## 2023-05-16 MED ORDER — CLOBETASOL PROPIONATE 0.05 % EX OINT
1.0000 | TOPICAL_OINTMENT | Freq: Two times a day (BID) | CUTANEOUS | 4 refills | Status: AC
  Filled 2023-05-16: qty 60, 21d supply, fill #0

## 2023-05-19 DIAGNOSIS — F411 Generalized anxiety disorder: Secondary | ICD-10-CM | POA: Diagnosis not present

## 2023-05-24 ENCOUNTER — Other Ambulatory Visit (HOSPITAL_BASED_OUTPATIENT_CLINIC_OR_DEPARTMENT_OTHER): Payer: Self-pay | Admitting: Family Medicine

## 2023-05-24 ENCOUNTER — Other Ambulatory Visit (HOSPITAL_COMMUNITY): Payer: Self-pay

## 2023-05-24 DIAGNOSIS — K219 Gastro-esophageal reflux disease without esophagitis: Secondary | ICD-10-CM

## 2023-05-24 MED ORDER — ESCITALOPRAM OXALATE 5 MG PO TABS
5.0000 mg | ORAL_TABLET | Freq: Every day | ORAL | 1 refills | Status: DC
  Filled 2023-05-24: qty 90, 90d supply, fill #0
  Filled 2023-08-19: qty 90, 90d supply, fill #1

## 2023-05-24 MED ORDER — PANTOPRAZOLE SODIUM 40 MG PO TBEC
40.0000 mg | DELAYED_RELEASE_TABLET | Freq: Every day | ORAL | 1 refills | Status: DC
  Filled 2023-05-24: qty 90, 90d supply, fill #0
  Filled 2023-08-19: qty 90, 90d supply, fill #1

## 2023-05-26 ENCOUNTER — Other Ambulatory Visit (HOSPITAL_COMMUNITY): Payer: Self-pay

## 2023-05-26 DIAGNOSIS — F411 Generalized anxiety disorder: Secondary | ICD-10-CM | POA: Diagnosis not present

## 2023-05-31 ENCOUNTER — Other Ambulatory Visit: Payer: Self-pay

## 2023-06-02 ENCOUNTER — Other Ambulatory Visit: Payer: Self-pay

## 2023-06-02 NOTE — Progress Notes (Signed)
Specialty Pharmacy Refill Coordination Note  Luis Khan is a 37 y.o. male contacted today regarding refills of specialty medication(s) Etanercept (Enbrel Mini)   Patient requested Delivery   Delivery date: 06/03/23   Verified address: [address]   [place] [facility] [id number]   Medication Luis be filled on 06/02/23.

## 2023-06-02 NOTE — Progress Notes (Signed)
Specialty Pharmacy Ongoing Clinical Assessment Note  Luis Khan is a 37 y.o. male who is being followed by the specialty pharmacy service for RxSp Ankylosing Spondylitis   Patient's specialty medication(s) reviewed today: Etanercept (Enbrel Mini)   Missed doses in the last 4 weeks: 0   Patient/Caregiver did not have any additional questions or concerns.   Therapeutic benefit summary: Patient is achieving benefit   Adverse events/side effects summary: No adverse events/side effects   Patient's therapy is appropriate to: Continue    Goals Addressed             This Visit's Progress    Reduce signs and symptoms       Patient is on track. Patient Luis maintain adherence         Follow up:  6 months  Otto Herb Specialty Pharmacist

## 2023-06-13 ENCOUNTER — Other Ambulatory Visit (HOSPITAL_COMMUNITY): Payer: Self-pay

## 2023-06-16 DIAGNOSIS — F411 Generalized anxiety disorder: Secondary | ICD-10-CM | POA: Diagnosis not present

## 2023-06-20 ENCOUNTER — Other Ambulatory Visit (HOSPITAL_COMMUNITY): Payer: Self-pay

## 2023-06-20 MED ORDER — DOXYCYCLINE MONOHYDRATE 50 MG PO CAPS
50.0000 mg | ORAL_CAPSULE | Freq: Every day | ORAL | 4 refills | Status: AC
  Filled 2023-06-20: qty 30, 30d supply, fill #0
  Filled 2023-07-28: qty 30, 30d supply, fill #1
  Filled 2023-09-22: qty 30, 30d supply, fill #2
  Filled 2024-02-05: qty 30, 30d supply, fill #3
  Filled 2024-05-09: qty 30, 30d supply, fill #4

## 2023-06-28 DIAGNOSIS — F411 Generalized anxiety disorder: Secondary | ICD-10-CM | POA: Diagnosis not present

## 2023-07-01 ENCOUNTER — Other Ambulatory Visit: Payer: Self-pay

## 2023-07-01 NOTE — Progress Notes (Signed)
Specialty Pharmacy Refill Coordination Note  Luis Khan is a 38 y.o. male contacted today regarding refills of specialty medication(s) Etanercept (Enbrel Mini)   Patient requested Delivery   Delivery date: 07/07/23   Verified address: [address]   [place] [address]   Medication Luis be filled on 01.29.25.

## 2023-07-04 ENCOUNTER — Other Ambulatory Visit: Payer: Self-pay

## 2023-07-04 ENCOUNTER — Encounter (HOSPITAL_BASED_OUTPATIENT_CLINIC_OR_DEPARTMENT_OTHER): Payer: Self-pay | Admitting: Emergency Medicine

## 2023-07-04 ENCOUNTER — Emergency Department (HOSPITAL_BASED_OUTPATIENT_CLINIC_OR_DEPARTMENT_OTHER)
Admission: EM | Admit: 2023-07-04 | Discharge: 2023-07-04 | Disposition: A | Discharge status: Home / Self Care | Payer: Commercial Managed Care - PPO | Attending: Emergency Medicine | Admitting: Emergency Medicine

## 2023-07-04 DIAGNOSIS — A084 Viral intestinal infection, unspecified: Secondary | ICD-10-CM | POA: Diagnosis not present

## 2023-07-04 DIAGNOSIS — Z20822 Contact with and (suspected) exposure to covid-19: Secondary | ICD-10-CM | POA: Insufficient documentation

## 2023-07-04 DIAGNOSIS — R1110 Vomiting, unspecified: Secondary | ICD-10-CM | POA: Diagnosis present

## 2023-07-04 LAB — COMPREHENSIVE METABOLIC PANEL
ALT: 82 U/L — ABNORMAL HIGH (ref 0–44)
AST: 42 U/L — ABNORMAL HIGH (ref 15–41)
Albumin: 4.9 g/dL (ref 3.5–5.0)
Alkaline Phosphatase: 48 U/L (ref 38–126)
Anion gap: 17 — ABNORMAL HIGH (ref 5–15)
BUN: 24 mg/dL — ABNORMAL HIGH (ref 6–20)
CO2: 19 mmol/L — ABNORMAL LOW (ref 22–32)
Calcium: 9.8 mg/dL (ref 8.9–10.3)
Chloride: 103 mmol/L (ref 98–111)
Creatinine, Ser: 1.03 mg/dL (ref 0.61–1.24)
GFR, Estimated: >60 mL/min (ref >60)
Glucose, Bld: 122 mg/dL — ABNORMAL HIGH (ref 70–99)
Potassium: 3.9 mmol/L (ref 3.5–5.1)
Sodium: 139 mmol/L (ref 135–145)
Total Bilirubin: 2.2 mg/dL — ABNORMAL HIGH (ref 0.0–1.2)
Total Protein: 8 g/dL (ref 6.5–8.1)

## 2023-07-04 LAB — CBC
HCT: 51.1 % (ref 39.0–52.0)
Hemoglobin: 18.2 g/dL — ABNORMAL HIGH (ref 13.0–17.0)
MCH: 31.9 pg (ref 26.0–34.0)
MCHC: 35.6 g/dL (ref 30.0–36.0)
MCV: 89.6 fL (ref 80.0–100.0)
Platelets: 233 10*3/uL (ref 150–400)
RBC: 5.7 MIL/uL (ref 4.22–5.81)
RDW: 12.4 % (ref 11.5–15.5)
WBC: 10 10*3/uL (ref 4.0–10.5)
nRBC: 0 % (ref 0.0–0.2)

## 2023-07-04 LAB — RESP PANEL BY RT-PCR (RSV, FLU A&B, COVID)  RVPGX2
Influenza A by PCR: NEGATIVE
Influenza B by PCR: NEGATIVE
Resp Syncytial Virus by PCR: NEGATIVE
SARS Coronavirus 2 by RT PCR: NEGATIVE

## 2023-07-04 LAB — URINALYSIS, ROUTINE W REFLEX MICROSCOPIC
Bilirubin Urine: NEGATIVE
Glucose, UA: NEGATIVE mg/dL
Hgb urine dipstick: NEGATIVE
Ketones, ur: 40 mg/dL — AB
Leukocytes,Ua: NEGATIVE
Nitrite: NEGATIVE
Specific Gravity, Urine: 1.03 (ref 1.005–1.030)
pH: 7.5 (ref 5.0–8.0)

## 2023-07-04 LAB — LIPASE, BLOOD: Lipase: 13 U/L (ref 11–51)

## 2023-07-04 MED ORDER — ONDANSETRON HCL 4 MG/2ML IJ SOLN
4.0000 mg | Freq: Once | INTRAMUSCULAR | Status: AC
  Administered 2023-07-04: 4 mg via INTRAVENOUS
  Filled 2023-07-04: qty 2

## 2023-07-04 MED ORDER — MAGNESIUM SULFATE 2 GM/50ML IV SOLN
2.0000 g | Freq: Once | INTRAVENOUS | Status: AC
  Administered 2023-07-04: 2 g via INTRAVENOUS
  Filled 2023-07-04: qty 50

## 2023-07-04 MED ORDER — FENTANYL CITRATE PF 50 MCG/ML IJ SOSY
50.0000 ug | PREFILLED_SYRINGE | Freq: Once | INTRAMUSCULAR | Status: AC
Start: 2023-07-04 — End: 2023-07-04
  Administered 2023-07-04: 50 ug via INTRAVENOUS
  Filled 2023-07-04: qty 1

## 2023-07-04 MED ORDER — ONDANSETRON HCL 4 MG PO TABS: 4.0000 mg | ORAL_TABLET | Freq: Four times a day (QID) | ORAL | 0 refills | Status: DC

## 2023-07-04 MED ORDER — MAGNESIUM SULFATE 50 % IJ SOLN
2.0000 g | Freq: Once | INTRAMUSCULAR | Status: DC
  Filled 2023-07-04: qty 4

## 2023-07-04 MED ORDER — SODIUM CHLORIDE 0.9 % IV BOLUS
1000.0000 mL | Freq: Once | INTRAVENOUS | Status: AC
  Administered 2023-07-04: 1000 mL via INTRAVENOUS

## 2023-07-04 NOTE — ED Triage Notes (Signed)
C/o generalized body aches. Endorses n/v/d since 0900 today, Unable to keep still in triage d/t body aches. States he is unable to keep anything down.

## 2023-07-04 NOTE — Discharge Instructions (Addendum)
Take Zofran every 6 hours as needed for nausea.  Continue to make sure you are getting plenty to drink, eat foods that are gentle on the stomach.  Symptoms should improve in the next 24 to 48 hours.

## 2023-07-04 NOTE — ED Provider Notes (Signed)
[place] EMERGENCY DEPARTMENT AT [facility] Provider Note   CSN: [id number] Arrival date & time: 07/04/23  1442     History  Chief Complaint  Patient presents with   Emesis    Luis Khan is a 38 y.o. male.  38 year old male here today for vomiting, diarrhea, pain in his hips, legs.  Patient has a history of ankylosing spondylitis, took his immunotherapy Embrel this morning.  In the afternoon, he began to feel weak, had multiple episodes of vomiting diarrhea.  There is a daughter at home with similar symptoms.   Emesis      Home Medications Prior to Admission medications   Medication Sig Start Date End Date Taking? Authorizing Provider  ondansetron (ZOFRAN) 4 MG tablet Take 1 tablet (4 mg total) by mouth every 6 (six) hours. 07/04/23  Yes Anders Simmonds T, DO  clobetasol (TEMOVATE) 0.05 % external solution Apply topically 2 (two) times daily to affected area as needed. Patient not taking: Reported on 02/23/2023 05/05/21     clobetasol (TEMOVATE) 0.05 % external solution Apply topically to damp scalp 1-2 times daily as needed for flares 05/06/22     clobetasol cream (TEMOVATE) 0.05 % Apply 1 Application topically as needed.    [provider]  clobetasol ointment (TEMOVATE) 0.05 % Apply to affected areas once to twice daily for 2-4 weeks then as needed for flares 05/16/23     doxycycline (MONODOX) 50 MG capsule Take 1 capsule (50 mg total) by mouth daily. 06/20/23     escitalopram (LEXAPRO) 5 MG tablet Take 1 tablet (5 mg total) by mouth daily. 05/24/23   de [place], Raymond J, MD  etanercept (ENBREL MINI) 50 MG/ML injection Inject 50 mg into the skin once a week. 02/23/23   Fuller Plan, MD  fexofenadine (ALLEGRA) 180 MG tablet Take 180 mg by mouth every morning.     [provider]  pantoprazole (PROTONIX) 40 MG tablet Take 1 tablet (40 mg total) by mouth daily. 05/24/23   de [place], Raymond J, MD  Triamcinolone Acetonide (NASACORT ALLERGY 24HR  NA) Place into the nose.    [provider]      Allergies    Augmentin [amoxicillin-pot clavulanate], Cephalexin, and Z-pak [azithromycin]    Review of Systems   Review of Systems  Gastrointestinal:  Positive for vomiting.    Physical Exam Updated Vital Signs BP 124/83   Pulse 87   Temp 98.6 F (37 C) (Oral)   Resp 16   Ht 6\' 5"  (1.956 m)   Wt 113.4 kg   SpO2 94%   BMI 29.65 kg/m  Physical Exam Vitals reviewed.  HENT:     Head: Normocephalic.  Cardiovascular:     Rate and Rhythm: Tachycardia present.  Pulmonary:     Effort: Pulmonary effort is normal.  Abdominal:     General: Abdomen is flat. There is no distension.     Palpations: Abdomen is soft.     Tenderness: There is no abdominal tenderness. There is no guarding.  Musculoskeletal:        General: Normal range of motion.  Neurological:     General: No focal deficit present.     Mental Status: He is alert.     ED Results / Procedures / Treatments   Labs (all labs ordered are listed, but only abnormal results are displayed) Labs Reviewed  COMPREHENSIVE METABOLIC PANEL - Abnormal; Notable for the following components:      Result Value  CO2 19 (*)    Glucose, Bld 122 (*)    BUN 24 (*)    AST 42 (*)    ALT 82 (*)    Total Bilirubin 2.2 (*)    Anion gap 17 (*)    All other components within normal limits  CBC - Abnormal; Notable for the following components:   Hemoglobin 18.2 (*)    All other components within normal limits  URINALYSIS, ROUTINE W REFLEX MICROSCOPIC - Abnormal; Notable for the following components:   Ketones, ur 40 (*)    Protein, ur TRACE (*)    All other components within normal limits  RESP PANEL BY RT-PCR (RSV, FLU A&B, COVID)  RVPGX2  LIPASE, BLOOD    EKG None  Radiology No results found.  Procedures Procedures    Medications Ordered in ED Medications  magnesium sulfate IVPB 2 g 50 mL (2 g Intravenous New Bag/Given 07/04/23 1916)  ondansetron (ZOFRAN)  injection 4 mg (has no administration in time range)  sodium chloride 0.9 % bolus 1,000 mL (0 mLs Intravenous Stopped 07/04/23 1829)  ondansetron (ZOFRAN) injection 4 mg (4 mg Intravenous Given 07/04/23 1513)  fentaNYL (SUBLIMAZE) injection 50 mcg (50 mcg Intravenous Given 07/04/23 1514)    ED Course/ Medical Decision Making/ A&P                                 Medical Decision Making 38 year old male here today with vomiting diarrhea.  Differential diagnose include viral gastroenteritis, less likely syncope, less likely ACS, less likely acute intra-abdominal infection.  Plan-patient with soft abdomen.  Overall looks well.  A little bit tachycardic.  Currently experiencing high rates of norovirus in our community.  Will symptomatically treat the patient.  He is immunocompromised.    Reassessment 8 PM-patient feeling a bit better after fluids, Zofran.  He did receive some magnesium for cramping.  Patient feels significantly better.  Tolerating p.o.  Will discharge.  Amount and/or Complexity of Data Reviewed Labs: ordered.  Risk Prescription drug management.           Final Clinical Impression(s) / ED Diagnoses Final diagnoses:  Viral gastroenteritis    Rx / DC Orders ED Discharge Orders          Ordered    ondansetron (ZOFRAN) 4 MG tablet  Every 6 hours        07/04/23 2009              Anders Simmonds T, DO 07/04/23 2010

## 2023-07-04 NOTE — ED Provider Triage Note (Signed)
Emergency Medicine Provider Triage Evaluation Note  Luis Khan , a 38 y.o. male  was evaluated in triage.  Pt complains of vomiting.  Review of Systems  Positive: Gen body aches, vomiting Negative: URI symptoms  Physical Exam  Pulse (!) 108   Temp 100.2 F (37.9 C) (Temporal)   Resp 18   Ht 6\' 5"  (1.956 m)   Wt 113.4 kg   SpO2 100%   BMI 29.65 kg/m  Gen:   Awake, no distress  Uncomfortable appearing Resp:  Normal effort  MSK:   Moves extremities without difficulty  Other:    Medical Decision Making  Medically screening exam initiated at 3:10 PM.  Appropriate orders placed.  Luis Khan was informed that the remainder of the evaluation will be completed by another provider, this initial triage assessment does not replace that evaluation, and the importance of remaining in the ED until their evaluation is complete.  On Embrel - spondylitis 24 hours of "hurt all over", started with abdominal pain, vomiting. No hematemsis. Fever Tmax at home 101.   Luis Anis, PA-C 07/04/23 1512

## 2023-07-06 ENCOUNTER — Other Ambulatory Visit: Payer: Self-pay

## 2023-07-12 ENCOUNTER — Other Ambulatory Visit (HOSPITAL_COMMUNITY): Payer: Self-pay

## 2023-07-14 DIAGNOSIS — F411 Generalized anxiety disorder: Secondary | ICD-10-CM | POA: Diagnosis not present

## 2023-07-21 ENCOUNTER — Ambulatory Visit (HOSPITAL_BASED_OUTPATIENT_CLINIC_OR_DEPARTMENT_OTHER): Payer: Commercial Managed Care - PPO | Admitting: Family Medicine

## 2023-07-21 ENCOUNTER — Encounter (HOSPITAL_BASED_OUTPATIENT_CLINIC_OR_DEPARTMENT_OTHER): Payer: Self-pay | Admitting: Family Medicine

## 2023-07-21 ENCOUNTER — Other Ambulatory Visit (HOSPITAL_BASED_OUTPATIENT_CLINIC_OR_DEPARTMENT_OTHER): Payer: Self-pay

## 2023-07-21 VITALS — BP 129/96 | HR 83 | Temp 98.2°F | Ht 77.0 in | Wt 243.6 lb

## 2023-07-21 DIAGNOSIS — U071 COVID-19: Secondary | ICD-10-CM | POA: Diagnosis not present

## 2023-07-21 LAB — POCT RAPID STREP A (OFFICE): Rapid Strep A Screen: NEGATIVE

## 2023-07-21 LAB — POC COVID19 BINAXNOW: SARS Coronavirus 2 Ag: POSITIVE — AB

## 2023-07-21 MED ORDER — PROMETHAZINE-DM 6.25-15 MG/5ML PO SYRP
5.0000 mL | ORAL_SOLUTION | Freq: Four times a day (QID) | ORAL | 0 refills | Status: DC | PRN
  Filled 2023-07-21: qty 118, 6d supply, fill #0

## 2023-07-21 NOTE — Progress Notes (Signed)
Acute Care Office Visit  Subjective:   Luis Khan 1986-01-04 07/21/2023  Chief Complaint  Patient presents with   Sore Throat    Patient states he has been having problems with drainage and also has had a sore throat due to it. Did a home covid test which came back positive and wanted to get this confirmed.    HPI: SORE THROAT: Onset: 2 days ago with sore throat and post nasal drip. Patient reports taking an at home COVID test yesterday which was positive. Denies known exposure.  Recent strep exposure: None   Treatments tried:  Allegra, Cough Drops    Symptoms: Fever:   No Nasal Congestion: Patient reports mild to moderate nasal drainage and congestion that has been relieved with Allegra  Cough/URI: Mild cough, non productive. Denies SHOB or Wheezing. States cough is "tickling" and does keep him up at night.  Myalgias:  No  Headache:  no  Rash: no  Swollen neck glands:   no  Heartburn: no   Red Flags Recent STD exposure: no Breathing difficulty:no Drooling: no Trismus: no    The following portions of the patient's history were reviewed and updated as appropriate: past medical history, past surgical history, family history, social history, allergies, medications, and problem list.   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   Knee pain, chronic 03/04/2021   Allergy, unspecified, initial encounter 02/17/2021   Gastroesophageal reflux disease 04/09/2015   Past Medical History:   Diagnosis Date   Allergy    Anxiety    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   Blood transfusion without reported diagnosis    Chronic allergic rhinitis    Flat feet, bilateral    GERD (gastroesophageal reflux disease) 10/03/2014   NASH (nonalcoholic steatohepatitis)    Patellar tendinitis    Right Knee   Premature labor, onset of delivery before 37 weeks of gestation, fetus 5 07/09/2015   Psoriasis 09/2814   Past Surgical History:  Procedure Laterality Date   CHOLECYSTECTOMY N/A 07/23/2016   Procedure: LAPAROSCOPIC CHOLECYSTECTOMY WITH INTRAOPERATIVE CHOLANGIOGRAM;  Surgeon: Nadeen Landau, MD;  Location: [facility] ORS;  Service: General;  Laterality: N/A;   ESOPHAGOGASTRODUODENOSCOPY (EGD) WITH PROPOFOL N/A 08/01/2015   Procedure: ESOPHAGOGASTRODUODENOSCOPY (EGD) WITH PROPOFOL;  Surgeon: Christena Deem, MD;  Location: [facility] ENDOSCOPY;  Service: Endoscopy;  Laterality: N/A;   WISDOM TOOTH EXTRACTION     Family History  Problem Relation Age of Onset   Irritable bowel syndrome Mother    Kidney Stones Mother    Hypertension Father    Gallstones Father    Hyperlipidemia Father    Pancreatic cancer Maternal Grandmother    Pancreatic disease Maternal Grandmother    Cancer Maternal Grandmother    Other Maternal Grandmother        Disk fusion   Dementia Maternal Grandfather    Heart attack Maternal Grandfather    Gallstones Paternal Grandmother    Drug abuse Paternal Grandmother    Hypertension Paternal Grandmother    Kidney Stones Paternal Grandmother  Congestive Heart Failure Paternal Grandfather    Coronary artery disease Paternal Grandfather    Heart attack Paternal Grandfather    Diabetes Paternal Grandfather    Healthy Daughter    Gallbladder disease Maternal Aunt    Kidney Stones Maternal Aunt    Diabetes Mellitus II Maternal Uncle    Outpatient Medications Prior to Visit  Medication Sig Dispense Refill   clobetasol cream  (TEMOVATE) 0.05 % Apply 1 Application topically as needed.     clobetasol ointment (TEMOVATE) 0.05 % Apply to affected areas once to twice daily for 2-4 weeks then as needed for flares 60 g 4   escitalopram (LEXAPRO) 5 MG tablet Take 1 tablet (5 mg total) by mouth daily. 90 tablet 1   etanercept (ENBREL MINI) 50 MG/ML injection Inject 50 mg into the skin once a week. 4 mL 5   fexofenadine (ALLEGRA) 180 MG tablet Take 180 mg by mouth every morning.      ondansetron (ZOFRAN) 4 MG tablet Take 1 tablet (4 mg total) by mouth every 6 (six) hours. 30 tablet 0   pantoprazole (PROTONIX) 40 MG tablet Take 1 tablet (40 mg total) by mouth daily. 90 tablet 1   Triamcinolone Acetonide (NASACORT ALLERGY 24HR NA) Place into the nose.     clobetasol (TEMOVATE) 0.05 % external solution Apply topically 2 (two) times daily to affected area as needed. (Patient not taking: Reported on 02/23/2023) 50 mL 3   clobetasol (TEMOVATE) 0.05 % external solution Apply topically to damp scalp 1-2 times daily as needed for flares 50 mL 4   doxycycline (MONODOX) 50 MG capsule Take 1 capsule (50 mg total) by mouth daily. 30 capsule 4   No facility-administered medications prior to visit.   Allergies  Allergen Reactions   Augmentin [Amoxicillin-Pot Clavulanate]     Naausea, diarrhea stomach cramping.   Cephalexin Diarrhea and Nausea Only   Z-Pak [Azithromycin] Nausea And Vomiting     ROS: A complete ROS was performed with pertinent positives/negatives noted in the HPI. The remainder of the ROS are negative.    Objective:   Today's Vitals   07/21/23 0852  BP: (!) 129/96  Pulse: 83  Temp: 98.2 F (36.8 C)  TempSrc: Oral  SpO2: 100%  Weight: 243 lb 9.6 oz (110.5 kg)  Height: 6\' 5"  (1.956 m)    GENERAL: Well-appearing, in NAD. Well nourished.  SKIN: Pink, warm and dry.  Head: Normocephalic. NECK: Trachea midline. Full ROM w/o pain or tenderness. No lymphadenopathy.  EARS: Tympanic membranes are intact, translucent  with mild effusion to right ear, without bulging and without drainage. Appropriate landmarks visualized.  EYES: Conjunctiva clear without exudates. EOMI, PERRL, no drainage present.  NOSE: Septum midline w/o deformity. Nares patent, mucosa pink and mildly-inflamed w/o drainage. Mild sinus tenderness.  THROAT: Uvula midline. Oropharynx clear. Tonsils non-inflamed without exudate. Mucous membranes pink and moist.  RESPIRATORY: Chest wall symmetrical. Respirations even and non-labored. Breath sounds clear to auscultation bilaterally.  CARDIAC: S1, S2 present, regular rate and rhythm without murmur or gallops. Peripheral pulses 2+ bilaterally.  MSK: Muscle tone and strength appropriate for age.  NEUROLOGIC: No motor or sensory deficits. Steady, even gait. C2-C12 intact.  PSYCH/MENTAL STATUS: Alert, oriented x 3. Cooperative, appropriate mood and affect.    Results for orders placed or performed in visit on 07/21/23  POCT rapid strep A  Result Value Ref Range   Rapid Strep A Screen Negative Negative  POC COVID-19 BinaxNow  Result Value Ref Range  SARS Coronavirus 2 Ag Positive (A) Negative      Assessment & Plan:  1. COVID-19 (Primary) Discussed symptom management with patient and recommend continuing antihistamine, use Delsym during daytime for cough, increase clear fluids and use Tylenol/Ibuprofen for fever/body aches. Promethazine DM sent in for nighttime cough. If symptoms worsen or no improvement within 48 hours, reach out to PCP.   - POCT rapid strep A - POC COVID-19 BinaxNow - promethazine-dextromethorphan (PROMETHAZINE-DM) 6.25-15 MG/5ML syrup; Take 5 mLs by mouth 4 (four) times daily as needed.  Dispense: 118 mL; Refill: 0   Meds ordered this encounter  Medications   promethazine-dextromethorphan (PROMETHAZINE-DM) 6.25-15 MG/5ML syrup    Sig: Take 5 mLs by mouth 4 (four) times daily as needed.    Dispense:  118 mL    Refill:  0    Supervising Provider:   DE [place], RAYMOND J  [id number]   Lab Orders         POCT rapid strep A         POC COVID-19 BinaxNow     Return if symptoms worsen or fail to improve.    Patient to reach out to office if new, worrisome, or unresolved symptoms arise or if no improvement in patient's condition. Patient verbalized understanding and is agreeable to treatment plan. All questions answered to patient's satisfaction.    Hilbert Bible, [place]

## 2023-07-21 NOTE — Patient Instructions (Signed)
COVID-19 positive:  Stay home and away from others until symptoms are improving and fever free for 24 hours without the use of tylenol or ibuprofen. If symptoms have improved and fever resolved, then can be in public setting, but should wear a mask around others, practice safe distancing, and perform hand hygiene for an additional 5 days. If symptoms worsen, or the patient develop shortness of breath, pulse oximeter reading of < 90%, or chest pain, seek immediate care at nearest emergency department or call 911.   Vitamin Regimen:  Vitamin C 500mg  twice daily  Vitamin D 5000 units once daily  Zinc 50-75mg  once daily   Over the counter Medications:  Aspirin 81mg  per day * unless allergic or contraindicated*  Use Tylenol (acetaminophen) for fever *unless allergic or contraindicated*    Non-Medication Therapy:  Drink plenty of fluids, warm if possible.   A teaspoon of honey may help ease coughing symptoms.   Cough drops or hard candy for coughing.   Over the Counter Medication Therapy:  Use a cough expectorant such as guaifenesin (Mucinex) if recommended by your doctor for a wet, congested cough. If you have high blood pressure, please ask your doctor first before using this.   Use a cough suppressant such as dextromethorphan (Robitussin/Delsym) for a dry cough. If you have high blood pressure, please ask your doctor first before using this.   If you have high blood pressure, medication such as Coricidin HBP is safe to take for your cough and will not increase your blood pressure.

## 2023-07-28 ENCOUNTER — Other Ambulatory Visit (HOSPITAL_COMMUNITY): Payer: Self-pay

## 2023-07-28 DIAGNOSIS — F411 Generalized anxiety disorder: Secondary | ICD-10-CM | POA: Diagnosis not present

## 2023-07-29 ENCOUNTER — Other Ambulatory Visit: Payer: Self-pay

## 2023-08-01 ENCOUNTER — Other Ambulatory Visit: Payer: Self-pay

## 2023-08-03 ENCOUNTER — Other Ambulatory Visit: Payer: Self-pay

## 2023-08-08 ENCOUNTER — Other Ambulatory Visit (HOSPITAL_BASED_OUTPATIENT_CLINIC_OR_DEPARTMENT_OTHER): Payer: Commercial Managed Care - PPO

## 2023-08-08 DIAGNOSIS — Z0000 Encounter for general adult medical examination without abnormal findings: Secondary | ICD-10-CM | POA: Diagnosis not present

## 2023-08-09 ENCOUNTER — Encounter (HOSPITAL_BASED_OUTPATIENT_CLINIC_OR_DEPARTMENT_OTHER): Payer: Self-pay | Admitting: Family Medicine

## 2023-08-09 LAB — LIPID PANEL
Chol/HDL Ratio: 3.7 ratio (ref 0.0–5.0)
Cholesterol, Total: 141 mg/dL (ref 100–199)
HDL: 38 mg/dL — ABNORMAL LOW (ref >39)
LDL Chol Calc (NIH): 82 mg/dL (ref 0–99)
Triglycerides: 113 mg/dL (ref 0–149)
VLDL Cholesterol Cal: 21 mg/dL (ref 5–40)

## 2023-08-09 LAB — COMPREHENSIVE METABOLIC PANEL
ALT: 65 IU/L — ABNORMAL HIGH (ref 0–44)
AST: 37 IU/L (ref 0–40)
Albumin: 4.3 g/dL (ref 4.1–5.1)
Alkaline Phosphatase: 83 IU/L (ref 44–121)
BUN/Creatinine Ratio: 11 (ref 9–20)
BUN: 11 mg/dL (ref 6–20)
Bilirubin Total: 0.8 mg/dL (ref 0.0–1.2)
CO2: 25 mmol/L (ref 20–29)
Calcium: 9.2 mg/dL (ref 8.7–10.2)
Chloride: 103 mmol/L (ref 96–106)
Creatinine, Ser: 0.96 mg/dL (ref 0.76–1.27)
Globulin, Total: 2.7 g/dL (ref 1.5–4.5)
Glucose: 96 mg/dL (ref 70–99)
Potassium: 4.7 mmol/L (ref 3.5–5.2)
Sodium: 141 mmol/L (ref 134–144)
Total Protein: 7 g/dL (ref 6.0–8.5)
eGFR: 103 mL/min/{1.73_m2} (ref >59)

## 2023-08-09 LAB — HEMOGLOBIN A1C
Est. average glucose Bld gHb Est-mCnc: 94 mg/dL
Hgb A1c MFr Bld: 4.9 % (ref 4.8–5.6)

## 2023-08-09 LAB — TSH RFX ON ABNORMAL TO FREE T4: TSH: 1.11 u[IU]/mL (ref 0.450–4.500)

## 2023-08-09 NOTE — Progress Notes (Deleted)
 Office Visit Note  Patient: Luis Khan             Date of Birth: 1985-10-29           MRN: [id number]             PCP: de Peru, Raymond J, MD Referring: de Peru, Raymond J, MD Visit Date: 08/23/2023   Subjective:  No chief complaint on file.   History of Present Illness: Luis Khan is a 38 y.o. 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    Previous HPI 02/23/2023 Luis Khan is a 38 y.o. 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  Feels his arthritis and psoriasis are doing very well.  Does not have any prolonged morning stiffness or requiring over-the-counter medications for joint pain.  Still 19 small area of rashes on the left upper thigh.  He also developed a new area with a frequently raised bump behind the left ear coming and going intermittently improves with topical steroids.  He had a few episodes of positional dizziness usually occur after prolonged time without eating or after having a few drinks.  Blood pressure has been better controlled remains mildly hypertensive.   Previous HPI 11/25/2022 Luis Khan is a 38 y.o. 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 Since last visit his joint symptoms are doing well. He is physically active doing peloton about 5 days per week. He was sick with a persistent sinus infection treated with oral antibiotics in the first half of May. He suffered a syncopal episode with fall and some bruising on hip and elbow while vacationing in [place] later in May. Was associated with prolonged time in hot bath water and alcohol use maybe some dehydration. No repeat episodes. He had some bumpy, erythematous rash on the central chest after a long day at the [place] [employer] this resolved fully. Psoriasis rashes have not returned.   Previous HPI 08/10/22 Luis Khan is a 38 y.o. male here for follow up for axial spondylarthritis on  Enbrel 50 mg subcu weekly.  So far since her last visit his arthritis is doing well no recurrent episodes of large knee effusion or requiring intra-articular steroid injections.  He increased his physical exercise now working out up to 5 days a week get some typical muscle soreness with this but no flareup of joint pain.  Feels his flexibility is getting better.  He is motivated due to worsening report on his blood pressure at PCP clinic follow-up though did not start any medications he is working on lifestyle.  Skin rash is doing well on his extremities but is noticing itchy rash on the center of the chest.  This bothers him usually for short time in the morning on a daily basis but is not severe he has not noticed any particular expansion over time.   Previous HPI 05/05/22 Luis Khan is a 38 y.o. male here for follow up for axial spondyloarthritis after starting Enbrel 50 mg [place] weekly. He has seen a large improvement in symptoms started since about 2 or 3 weeks in and has done well since then. Morning stiffness is now minimal. Flexibility in hips and knees improved. Still taking aleve routine twice daily. Skin rashes are also largely but not completely better on scalp, arms, and hips.   Previous HPI 02/10/22 Luis Khan is a  38 y.o. male here for follow up for evaluation of recurrent left knee effusion and joint pains in shoulders, back, and hips. Labs at initial visit positive for HLA-B27 and elevated ESR and CRP.  Since her last visit he had a left knee steroid injection with Dr. Pearletha Forge with improvement in symptoms as he needs to be functional for an upcoming golf event this weekend.  Otherwise symptoms remain about the same he is still requiring maximal dose of naproxen to mostly control symptoms.   Previous HPI 01/26/2022 Luis Khan is a 38 y.o. male here for evaluation of recurrent left knee effusion also joint pains increased in multiple areas. He has been noticing new or increased  symptoms since last summer with left knee effusion and restricted range of motion. He also had ongoing right knee pain with patellar tendonitis.  Imaging without significant OA changes and knee aspiration with moderate WBC increase otherwise bland. The knee effusion remained improved for months and he participated in PT for several months with a good benefit in his symptoms. However over time and especially since finishing PT symptoms are worsening again now including mid back pain, shoulders, hips, and knee pains. His back pain is most severe at night and first thing in the mornings. He sees a large but incomplete improvement with taking 2 tablets of naproxen, most effective at night time to reduce morning symptoms. He had recurrence of left knee effusion and aspiration by Dr. Pearletha Forge with findings of elevated cell count both RBCs and WBCs 7398. Previous aspiration from a year ago with elevated WBCs 4067. He does have a history of skin psoriasis, referred to new dermatology last October for increase in symptoms and he is controlling this with topical clobetasol. He has chronic GI symptoms attributed to IBS and also history of celiac disease. Currently these symptoms are at about normal baseline. He was also seeing his eye doctor recent with recurrent styes or hordeolum, but no history of iritis or uveitis known.    Labs reviewed 02/2021 RF neg ANA neg ESR 4 CRP 5   Imaging reviewed 02/10/2021 MRI Left knee IMPRESSION: Focal mild bony edema within the medial aspect of the mid patella, near the MPFL/medial retinacular attachment. The MPFL/medial retinaculum appear intact. This could represent a focal bony contusion or traction injury.   Moderate-sized joint effusion.   No evidence of meniscus tear.   No Rheumatology ROS completed.   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/04/2021   Knee pain, chronic 03/04/2021   Allergy, unspecified, initial encounter 02/17/2021   Gastroesophageal reflux disease 04/09/2015    Past Medical History:  Diagnosis Date   Allergy    Anxiety    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   Blood transfusion without reported diagnosis    Chronic allergic rhinitis    Flat feet, bilateral    GERD (gastroesophageal reflux disease) 10/03/2014   NASH (nonalcoholic steatohepatitis)    Patellar tendinitis    Right Knee   Premature labor, onset of delivery before 37  weeks of gestation, fetus 5 07/09/2015   Psoriasis 09/2814    Family History  Problem Relation Age of Onset   Irritable bowel syndrome Mother    Kidney Stones Mother    Hypertension Father    Gallstones Father    Hyperlipidemia Father    Pancreatic cancer Maternal Grandmother    Pancreatic disease Maternal Grandmother    Cancer Maternal Grandmother    Other Maternal Grandmother        Disk fusion   Dementia Maternal Grandfather    Heart attack Maternal Grandfather    Gallstones Paternal Grandmother    Drug abuse Paternal Grandmother    Hypertension Paternal Grandmother    Kidney Stones Paternal Grandmother    Congestive Heart Failure Paternal Grandfather    Coronary artery disease Paternal Grandfather    Heart attack Paternal Grandfather    Diabetes Paternal Grandfather    Healthy Daughter    Gallbladder disease Maternal Aunt    Kidney Stones Maternal Aunt    Diabetes Mellitus II Maternal Uncle    Past Surgical History:  Procedure Laterality Date    CHOLECYSTECTOMY N/A 07/23/2016   Procedure: LAPAROSCOPIC CHOLECYSTECTOMY WITH INTRAOPERATIVE CHOLANGIOGRAM;  Surgeon: Nadeen Landau, MD;  Location: [facility] ORS;  Service: General;  Laterality: N/A;   ESOPHAGOGASTRODUODENOSCOPY (EGD) WITH PROPOFOL N/A 08/01/2015   Procedure: ESOPHAGOGASTRODUODENOSCOPY (EGD) WITH PROPOFOL;  Surgeon: Christena Deem, MD;  Location: [facility] END[facility]COPY;  Service: Endoscopy;  Laterality: N/A;   WISDOM TOOTH EXTRACTION     Social History   Social History Narrative   Not on file   Immunization History  Administered Date(s) Administered   Influenza, Seasonal, Injecte, Preservative Fre 02/11/2023   Influenza,inj,Quad PF,6+ Mos 04/03/2019, 03/21/2022   Influenza-Unspecified 03/02/2018, 02/27/2020, 04/03/2021   Tdap 01/31/2018     Objective: Vital Signs: There were no vitals taken for this visit.   Physical Exam   Musculoskeletal Exam: ***  CDAI Exam: CDAI Score: -- Patient Global: --; Provider Global: -- Swollen: --; Tender: -- Joint Exam 08/23/2023   No joint exam has been documented for this visit   There is currently no information documented on the homunculus. Go to the Rheumatology activity and complete the homunculus joint exam.  Investigation: No additional findings.  Imaging: No results found.  Recent Labs: Lab Results  Component Value Date   WBC 10.0 07/04/2023   HGB 18.2 (H) 07/04/2023   PLT 233 07/04/2023   NA 141 08/08/2023   K 4.7 08/08/2023   CL 103 08/08/2023   CO2 25 08/08/2023   GLUCOSE 96 08/08/2023   BUN 11 08/08/2023   CREATININE 0.96 08/08/2023   BILITOT 0.8 08/08/2023   ALKPHOS 83 08/08/2023   AST 37 08/08/2023   ALT 65 (H) 08/08/2023   PROT 7.0 08/08/2023   ALBUMIN 4.3 08/08/2023   CALCIUM 9.2 08/08/2023   GFRAA 107 12/26/2018   QFTBGOLDPLUS NEGATIVE 11/25/2022    Speciality Comments: No specialty comments available.  Procedures:  No procedures performed Allergies: Augmentin [amoxicillin-pot  clavulanate], Cephalexin, and Z-pak [azithromycin]   Assessment / Plan:     Visit Diagnoses: No diagnosis found.  ***  Orders: No orders of the defined types were placed in this encounter.  No orders of the defined types were placed in this encounter.    Follow-Up Instructions: No follow-ups on file.   Metta Clines, RT  Note - This record has been created using [employer].  Chart creation errors have been sought, but may not always  have been located.  Such creation errors do not reflect on  the standard of medical care.

## 2023-08-11 DIAGNOSIS — F411 Generalized anxiety disorder: Secondary | ICD-10-CM | POA: Diagnosis not present

## 2023-08-12 ENCOUNTER — Other Ambulatory Visit (HOSPITAL_COMMUNITY): Payer: Self-pay

## 2023-08-15 ENCOUNTER — Encounter (HOSPITAL_BASED_OUTPATIENT_CLINIC_OR_DEPARTMENT_OTHER): Payer: Self-pay | Admitting: Family Medicine

## 2023-08-15 ENCOUNTER — Ambulatory Visit (INDEPENDENT_AMBULATORY_CARE_PROVIDER_SITE_OTHER): Payer: Commercial Managed Care - PPO | Admitting: Family Medicine

## 2023-08-15 VITALS — BP 130/89 | HR 59 | Ht 77.0 in | Wt 243.3 lb

## 2023-08-15 DIAGNOSIS — Z0000 Encounter for general adult medical examination without abnormal findings: Secondary | ICD-10-CM

## 2023-08-15 DIAGNOSIS — H0019 Chalazion unspecified eye, unspecified eyelid: Secondary | ICD-10-CM | POA: Insufficient documentation

## 2023-08-15 DIAGNOSIS — H0014 Chalazion left upper eyelid: Secondary | ICD-10-CM

## 2023-08-15 DIAGNOSIS — F411 Generalized anxiety disorder: Secondary | ICD-10-CM | POA: Diagnosis not present

## 2023-08-15 NOTE — Assessment & Plan Note (Signed)
 Exam consistent with urination.  Has been present for about a month now, given duration, can proceed with referral to ophthalmology

## 2023-08-15 NOTE — Assessment & Plan Note (Signed)
Routine HCM labs reviewed. HCM reviewed/discussed. Anticipatory guidance regarding healthy weight, lifestyle and choices given. Recommend healthy diet.  Recommend approximately 150 minutes/week of moderate intensity exercise Recommend regular dental and vision exams Always use seatbelt/lap and shoulder restraints Recommend using smoke alarms and checking batteries at least twice a year Recommend using sunscreen when outside 

## 2023-08-15 NOTE — Patient Instructions (Signed)
  Medication Instructions:  Your physician recommends that you continue on your current medications as directed. Please refer to the Current Medication list given to you today. --If you need a refill on any your medications before your next appointment, please call your pharmacy first. If no refills are authorized on file call the office.--  Referrals/Procedures/Imaging: [facility]  Follow-Up: Your next appointment:   Your physician recommends that you schedule a follow-up appointment in: 6 month follow up  with Dr. de Peru  You will receive a text message or e-mail with a link to a survey about your care and experience with [place] today! We would greatly appreciate your feedback!   Thanks for letting us be apart of your health journey!!  Primary Care and Sports Medicine   Dr. Ceasar Mons [place]   We encourage you to activate your patient portal called "MyChart".  Sign up information is provided on this After Visit Summary.  MyChart is used to connect with patients for Virtual Visits (Telemedicine).  Patients are able to view lab/test results, encounter notes, upcoming appointments, etc.  Non-urgent messages can be sent to your provider as well. To learn more about what you can do with MyChart, please visit --  [email or web address].

## 2023-08-15 NOTE — Progress Notes (Signed)
 Subjective:    CC: Annual Physical Exam  HPI: Luis Khan is a 38 y.o. presenting for annual physical  I reviewed the past medical history, family history, social history, surgical history, and allergies today and no changes were needed.  Please see the problem list section below in epic for further details.  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   Blood transfusion without reported diagnosis    Chronic allergic rhinitis    Flat feet, bilateral    GERD (gastroesophageal reflux disease) 10/03/2014   NASH (nonalcoholic steatohepatitis)    Patellar tendinitis    Right Knee   Premature labor, onset of delivery before 37 weeks of gestation, fetus 5 07/09/2015   Psoriasis 09/2814   Past Surgical History: Past Surgical History:  Procedure Laterality Date   CHOLECYSTECTOMY N/A 07/23/2016   Procedure: LAPAROSCOPIC CHOLECYSTECTOMY WITH INTRAOPERATIVE CHOLANGIOGRAM;  Surgeon: Nadeen Landau, MD;  Location: [facility] ORS;  Service: General;  Laterality: N/A;   ESOPHAGOGASTRODUODENOSCOPY (EGD) WITH PROPOFOL N/A 08/01/2015   Procedure: ESOPHAGOGASTRODUODENOSCOPY (EGD) WITH PROPOFOL;  Surgeon: Christena Deem, MD;  Location: [facility] ENDOSCOPY;  Service: Endoscopy;  Laterality: N/A;   WISDOM TOOTH EXTRACTION     Social History: Social History   Socioeconomic History   Marital status: Married    Spouse name: Not on file   Number of children: Not on file   Years of education: Not on file   Highest education level: Master's degree (e.g., MA, MS, MEng, MEd, MSW, MBA)  Occupational History   Not on file  Tobacco Use   Smoking status: Never    Passive exposure: Never   Smokeless tobacco: Never  Vaping Use   Vaping status: Never Used  Substance and Sexual Activity   Alcohol use: Yes    Alcohol/week: 1.0 standard drink of alcohol    Types: 1 Glasses of wine per  week    Comment: occasionally/ special events   Drug use: No   Sexual activity: Yes    Birth control/protection: None    Comment: Wife has an IUD  Other Topics Concern   Not on file  Social History Narrative   Not on file   Social Drivers of Health   Financial Resource Strain: Low Risk  (08/15/2023)   Overall Financial Resource Strain (CARDIA)    Difficulty of Paying Living Expenses: Not hard at all  Food Insecurity: No Food Insecurity (08/15/2023)   Hunger Vital Sign    Worried About Running Out of Food in the Last Year: Never true    Ran Out of Food in the Last Year: Never true  Transportation Needs: No Transportation Needs (08/15/2023)   PRAPARE - Administrator, Civil Service (Medical): No    Lack of Transportation (Non-Medical): No  Physical Activity: Sufficiently Active (08/15/2023)   Exercise Vital Sign    Days of Exercise per Week: 5 days    Minutes of Exercise per Session: 40 min  Stress: No Stress Concern Present (08/15/2023)   [employer] of Occupational Health - Occupational Stress Questionnaire    Feeling of Stress : Not at all  Social Connections: Socially Integrated (08/15/2023)   Social Connection and Isolation Panel [NHANES]    Frequency of Communication with Friends and Family: More than three times a week    Frequency of Social Gatherings with Friends and Family: More than three times a week  Attends Religious Services: More than 4 times per year    Active Member of Clubs or Organizations: Yes    Attends Engineer, structural: More than 4 times per year    Marital Status: Married   Family History: Family History  Problem Relation Age of Onset   Irritable bowel syndrome Mother    Kidney Stones Mother    Hypertension Father    Gallstones Father    Hyperlipidemia Father    Pancreatic cancer Maternal Grandmother    Pancreatic disease Maternal Grandmother    Cancer Maternal Grandmother    Other Maternal Grandmother        Disk  fusion   Dementia Maternal Grandfather    Heart attack Maternal Grandfather    Gallstones Paternal Grandmother    Drug abuse Paternal Grandmother    Hypertension Paternal Grandmother    Kidney Stones Paternal Grandmother    Congestive Heart Failure Paternal Grandfather    Coronary artery disease Paternal Grandfather    Heart attack Paternal Grandfather    Diabetes Paternal Grandfather    Healthy Daughter    Gallbladder disease Maternal Aunt    Kidney Stones Maternal Aunt    Diabetes Mellitus II Maternal Uncle    Allergies: Allergies  Allergen Reactions   Augmentin [Amoxicillin-Pot Clavulanate]     Naausea, diarrhea stomach cramping.   Cephalexin Diarrhea and Nausea Only   Z-Pak [Azithromycin] Nausea And Vomiting   Medications: See med rec.  Review of Systems: No headache, visual changes, nausea, vomiting, diarrhea, constipation, dizziness, abdominal pain, skin rash, fevers, chills, night sweats, swollen lymph nodes, weight loss, chest pain, body aches, joint swelling, muscle aches, shortness of breath, mood changes, visual or auditory hallucinations.  Objective:    BP 130/89 (BP Location: Left Arm, Patient Position: Sitting, Cuff Size: Large)   Pulse (!) 59   Ht 6\' 5"  (1.956 m)   Wt 243 lb 4.8 oz (110.4 kg)   SpO2 97%   BMI 28.85 kg/m   General: Well Developed, well nourished, and in no acute distress. Neuro: Alert and oriented x3, extra-ocular muscles intact, sensation grossly intact. Cranial nerves II through XII are intact, motor, sensory, and coordinative functions are all intact. HEENT: Normocephalic, atraumatic, pupils equal round reactive to light, neck supple, no masses, no lymphadenopathy, thyroid nonpalpable. Oropharynx, nasopharynx, external ear canals are unremarkable.  Small nodule located along left upper eyelid.  This is nontender. Skin: Warm and dry, no rashes noted. Cardiac: Regular rate and rhythm, no murmurs rubs or gallops. Respiratory: Clear to  auscultation bilaterally. Not using accessory muscles, speaking in full sentences. Abdominal: Soft, nontender, nondistended, positive bowel sounds, no masses, no organomegaly. Musculoskeletal: Shoulder, elbow, wrist, hip, knee, ankle stable, and with full range of motion.  Impression and Recommendations:    Wellness examination Assessment & Plan: Routine HCM labs reviewed. HCM reviewed/discussed. Anticipatory guidance regarding healthy weight, lifestyle and choices given. Recommend healthy diet.  Recommend approximately 150 minutes/week of moderate intensity exercise Recommend regular dental and vision exams Always use seatbelt/lap and shoulder restraints Recommend using smoke alarms and checking batteries at least twice a year Recommend using sunscreen when outside   [place] of left upper eyelid Assessment & Plan: Exam consistent with urination.  Has been present for about a month now, given duration, can proceed with referral to ophthalmology  Orders: -     Ambulatory referral to Ophthalmology  Return in about 6 months (around 02/15/2024) for blood pressure.   ___________________________________________ Kewan Mcnease de [place], MD, ABFM, CAQSM Primary  Care and Sports Medicine [facility] [place]

## 2023-08-19 ENCOUNTER — Other Ambulatory Visit: Payer: Self-pay

## 2023-08-19 ENCOUNTER — Other Ambulatory Visit (HOSPITAL_COMMUNITY): Payer: Self-pay

## 2023-08-19 ENCOUNTER — Telehealth: Payer: Self-pay

## 2023-08-19 NOTE — Progress Notes (Signed)
 Specialty Pharmacy Refill Coordination Note  Luis Khan is a 38 y.o. male contacted today regarding refills of specialty medication(s) Etanercept (Enbrel Mini)   Patient requested Delivery   Delivery date: 08/23/23   Verified address: [address]   [place] [address]   Medication Luis be filled on 08/22/23.

## 2023-08-19 NOTE — Telephone Encounter (Signed)
 Received notification from [facility] pharmacy that patient requires a new authorization for their medication.  Submitted an URGENT Prior Authorization request to [facility] for ENBREL via CoverMyMeds. Will update once we receive a response.  Key: [id number]

## 2023-08-22 ENCOUNTER — Other Ambulatory Visit: Payer: Self-pay

## 2023-08-22 NOTE — Telephone Encounter (Signed)
 Received notification from [facility] regarding a prior authorization for ENBREL. Authorization has been APPROVED from 08/19/2023 to 08/17/2024. Approval letter sent to scan center.  Patient must continue to fill through [facility]: [phone]   Authorization # [id number]  Chesley Mires, PharmD, MPH, BCPS, CPP Clinical Pharmacist (Rheumatology and Pulmonology)

## 2023-08-23 ENCOUNTER — Ambulatory Visit: Payer: Commercial Managed Care - PPO | Admitting: Internal Medicine

## 2023-08-23 DIAGNOSIS — L409 Psoriasis, unspecified: Secondary | ICD-10-CM

## 2023-08-23 DIAGNOSIS — M452 Ankylosing spondylitis of cervical region: Secondary | ICD-10-CM

## 2023-08-23 DIAGNOSIS — R55 Syncope and collapse: Secondary | ICD-10-CM

## 2023-08-23 DIAGNOSIS — Z79899 Other long term (current) drug therapy: Secondary | ICD-10-CM

## 2023-08-31 DIAGNOSIS — F411 Generalized anxiety disorder: Secondary | ICD-10-CM | POA: Diagnosis not present

## 2023-09-08 DIAGNOSIS — F411 Generalized anxiety disorder: Secondary | ICD-10-CM | POA: Diagnosis not present

## 2023-09-13 ENCOUNTER — Other Ambulatory Visit (HOSPITAL_COMMUNITY): Payer: Self-pay

## 2023-09-13 DIAGNOSIS — F411 Generalized anxiety disorder: Secondary | ICD-10-CM | POA: Diagnosis not present

## 2023-09-16 ENCOUNTER — Other Ambulatory Visit: Payer: Self-pay

## 2023-09-21 ENCOUNTER — Other Ambulatory Visit: Payer: Self-pay

## 2023-09-21 ENCOUNTER — Other Ambulatory Visit (HOSPITAL_COMMUNITY): Payer: Self-pay

## 2023-09-21 ENCOUNTER — Other Ambulatory Visit: Payer: Self-pay | Admitting: Internal Medicine

## 2023-09-21 DIAGNOSIS — M452 Ankylosing spondylitis of cervical region: Secondary | ICD-10-CM

## 2023-09-21 DIAGNOSIS — L409 Psoriasis, unspecified: Secondary | ICD-10-CM

## 2023-09-21 DIAGNOSIS — Z79899 Other long term (current) drug therapy: Secondary | ICD-10-CM

## 2023-09-21 MED ORDER — ENBREL MINI 50 MG/ML ~~LOC~~ SOCT
50.0000 mg | SUBCUTANEOUS | 0 refills | Status: DC
  Filled 2023-09-21 (×2): qty 4, 28d supply, fill #0

## 2023-09-21 NOTE — Progress Notes (Signed)
 Specialty Pharmacy Refill Coordination Note  Luis Khan is a 38 y.o. male contacted today regarding refills of specialty medication(s) Etanercept (Enbrel Mini)   Patient requested Delivery   Delivery date: 09/23/23   Verified address: [address]   [name] [address]   Medication Luis be filled on 09/22/23.   This fill date is pending response to refill request from provider. Patient is aware and if they have not received fill by intended date they must follow up with pharmacy. Patient Luis call provider for refill as well.

## 2023-09-21 NOTE — Telephone Encounter (Signed)
 Last Fill: 02/23/2023  Labs: 08/08/2023 ALT 65, 07/04/2023 Hgb 18.2  TB Gold: 11/25/2022 Neg    Next Visit: 10/06/2023  Last Visit: 02/23/2023  DX: 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    Current Dose per office note 02/23/2023: Enbrel 50 mg subcu weekly.   Okay to refill Enbrel?

## 2023-09-22 ENCOUNTER — Other Ambulatory Visit (HOSPITAL_COMMUNITY): Payer: Self-pay

## 2023-09-22 ENCOUNTER — Other Ambulatory Visit: Payer: Self-pay

## 2023-09-22 DIAGNOSIS — F411 Generalized anxiety disorder: Secondary | ICD-10-CM | POA: Diagnosis not present

## 2023-09-28 ENCOUNTER — Ambulatory Visit: Admitting: Internal Medicine

## 2023-09-28 NOTE — Progress Notes (Deleted)
 Office Visit Note  Patient: Luis Khan             Date of Birth: November 27, 1985           MRN: [id number]             PCP: de [place], Raymond J, MD Referring: de [place], Raymond J, MD Visit Date: 09/28/2023   Subjective:  No chief complaint on file.   History of Present Illness: Luis Khan is a 38 y.o. male here for follow up ***   Previous HPI 02/23/23 Luis Khan is a 38 y.o. 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  Feels his arthritis and psoriasis are doing very well.  Does not have any prolonged morning stiffness or requiring over-the-counter medications for joint pain.  Still 19 small area of rashes on the left upper thigh.  He also developed a new area with a frequently raised bump behind the left ear coming and going intermittently improves with topical steroids.  He had a few episodes of positional dizziness usually occur after prolonged time without eating or after having a few drinks.  Blood pressure has been better controlled remains mildly hypertensive.   Previous HPI 11/25/2022 Luis Khan is a 38 y.o. 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 Since last visit his joint symptoms are doing well. He is physically active doing peloton about 5 days per week. He was sick with a persistent sinus infection treated with oral antibiotics in the first half of May. He suffered a syncopal episode with fall and some bruising on hip and elbow while vacationing in [place]  later in May. Was associated with prolonged time in hot bath water and alcohol use maybe some dehydration. No repeat episodes. He had some bumpy, erythematous rash on the central chest after a long [place] at the [place] this resolved fully. Psoriasis rashes have not returned.   Previous HPI 08/10/22 Luis Khan is a 38 y.o. male here for follow up for axial spondylarthritis on Enbrel  50 mg [place] weekly.  So far since her last  visit his arthritis is doing well no recurrent episodes of large knee effusion or requiring intra-articular steroid injections.  He increased his physical exercise now working out up to 5 days a week get some typical muscle soreness with this but no flareup of joint pain.  Feels his flexibility is getting better.  He is motivated due to worsening report on his blood pressure at PCP clinic follow-up though did not start any medications he is working on lifestyle.  Skin rash is doing well on his extremities but is noticing itchy rash on the center of the chest.  This bothers him usually for short time in the morning on a daily basis but is not severe he has not noticed any particular expansion over time.   Previous HPI 05/05/22 Luis Khan is a 38 y.o. male here for follow up for axial spondyloarthritis after starting Enbrel  50 mg [place] weekly. He has seen a large improvement in symptoms started since about 2 or 3 weeks in and has done well since then. Morning stiffness is now minimal. Flexibility in hips and knees improved. Still taking aleve routine twice daily. Skin rashes are also largely but not completely better on scalp, arms, and hips.   Previous HPI 02/10/22 Luis Khan is a 38 y.o. male here for follow up for evaluation  of recurrent left knee effusion and joint pains in shoulders, back, and hips. Labs at initial visit positive for HLA-B27 and elevated ESR and CRP.  Since her last visit he had a left knee steroid injection with Dr. Peggy Bowens with improvement in symptoms as he needs to be functional for an upcoming golf event this weekend.  Otherwise symptoms remain about the same he is still requiring maximal dose of naproxen to mostly control symptoms.   Previous HPI 01/26/2022 Luis Khan is a 38 y.o. male here for evaluation of recurrent left knee effusion also joint pains increased in multiple areas. He has been noticing new or increased symptoms since last summer with left knee  effusion and restricted range of motion. He also had ongoing right knee pain with patellar tendonitis.  Imaging without significant OA changes and knee aspiration with moderate WBC increase otherwise bland. The knee effusion remained improved for months and he participated in PT for several months with a good benefit in his symptoms. However over time and especially since finishing PT symptoms are worsening again now including mid back pain, shoulders, hips, and knee pains. His back pain is most severe at night and first thing in the mornings. He sees a large but incomplete improvement with taking 2 tablets of naproxen, most effective at night time to reduce morning symptoms. He had recurrence of left knee effusion and aspiration by Dr. Peggy Bowens with findings of elevated cell count both RBCs and WBCs 7398. Previous aspiration from a year ago with elevated WBCs 4067. He does have a history of skin psoriasis, referred to new dermatology last October for increase in symptoms and he is controlling this with topical clobetasol . He has chronic GI symptoms attributed to IBS and also history of celiac disease. Currently these symptoms are at about normal baseline. He was also seeing his eye doctor recent with recurrent styes or hordeolum, but no history of iritis or uveitis known.    Labs reviewed 02/2021 RF neg ANA neg ESR 4 CRP 5   Imaging reviewed 02/10/2021 MRI Left knee IMPRESSION: Focal mild bony edema within the medial aspect of the mid patella, near the MPFL/medial retinacular attachment. The MPFL/medial retinaculum appear intact. This could represent a focal bony contusion or traction injury.   Moderate-sized joint effusion.   No evidence of meniscus tear.   No Rheumatology ROS completed.   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   Low back pain 01/26/2022   High risk medication use 01/26/2022   Wellness examination 07/21/2021   Environmental and seasonal allergies 03/17/2021   Psoriasis 03/17/2021   FCU (flexor carpi ulnaris) tenosynovitis 03/04/2021   Knee pain, chronic 03/04/2021   Allergy, unspecified, initial encounter 02/17/2021   Gastroesophageal reflux disease 04/09/2015    Past Medical History:  Diagnosis Date   Allergy    Anxiety    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   Blood transfusion without reported diagnosis    Chronic allergic rhinitis    Flat feet, bilateral    GERD (gastroesophageal reflux disease) 10/03/2014   NASH (nonalcoholic steatohepatitis)    Patellar tendinitis    Right Knee   Premature labor, onset of delivery before 37 weeks of gestation, fetus 5 07/09/2015  Psoriasis 09/2814    Family History  Problem Relation Age of Onset   Irritable bowel syndrome Mother    Kidney Stones Mother    Hypertension Father    Gallstones Father    Hyperlipidemia Father    Pancreatic cancer Maternal Grandmother    Pancreatic disease Maternal Grandmother    Cancer Maternal Grandmother    Other Maternal Grandmother        Disk fusion   Dementia Maternal Grandfather    Heart attack Maternal Grandfather    Gallstones Paternal Grandmother    Drug abuse Paternal Grandmother    Hypertension Paternal Grandmother    Kidney Stones Paternal Grandmother    Congestive Heart Failure Paternal Grandfather    Coronary artery disease Paternal Grandfather    Heart attack Paternal Grandfather    Diabetes Paternal Grandfather    Healthy Daughter    Gallbladder disease Maternal Aunt    Kidney Stones Maternal Aunt    Diabetes Mellitus II Maternal Uncle    Past Surgical History:  Procedure Laterality Date   CHOLECYSTECTOMY N/A  07/23/2016   Procedure: LAPAROSCOPIC CHOLECYSTECTOMY WITH INTRAOPERATIVE CHOLANGIOGRAM;  Surgeon: Benancio Bracket, MD;  Location: [facility];  Service: General;  Laterality: N/A;   ESOPHAGOGASTRODUODENOSCOPY (EGD) WITH PROPOFOL  N/A 08/01/2015   Procedure: ESOPHAGOGASTRODUODENOSCOPY (EGD) WITH PROPOFOL ;  Surgeon: Deveron Fly, MD;  Location: [facility];  Service: Endoscopy;  Laterality: N/A;   WISDOM TOOTH EXTRACTION     Social History   Social History Narrative   Not on file   Immunization History  Administered Date(s) Administered   Influenza, Seasonal, Injecte, Preservative Fre 02/11/2023   Influenza,inj,Quad PF,6+ Mos 04/03/2019, 03/21/2022   Influenza-Unspecified 03/02/2018, 02/27/2020, 04/03/2021   Tdap 01/31/2018     Objective: Vital Signs: There were no vitals taken for this visit.   Physical Exam   Musculoskeletal Exam: ***  CDAI Exam: CDAI Score: -- Patient Global: --; Provider Global: -- Swollen: --; Tender: -- Joint Exam 09/28/2023   No joint exam has been documented for this visit   There is currently no information documented on the homunculus. Go to the Rheumatology activity and complete the homunculus joint exam.  Investigation: No additional findings.  Imaging: No results found.  Recent Labs: Lab Results  Component Value Date   WBC 10.0 07/04/2023   HGB 18.2 (H) 07/04/2023   PLT 233 07/04/2023   NA 141 08/08/2023   K 4.7 08/08/2023   CL 103 08/08/2023   CO2 25 08/08/2023   GLUCOSE 96 08/08/2023   BUN 11 08/08/2023   CREATININE 0.96 08/08/2023   BILITOT 0.8 08/08/2023   ALKPHOS 83 08/08/2023   AST 37 08/08/2023   ALT 65 (H) 08/08/2023   PROT 7.0 08/08/2023   ALBUMIN 4.3 08/08/2023   CALCIUM 9.2 08/08/2023   GFRAA 107 12/26/2018   QFTBGOLDPLUS NEGATIVE 11/25/2022    Speciality Comments: No specialty comments available.  Procedures:  No procedures performed Allergies: Augmentin  [amoxicillin -pot clavulanate], Cephalexin , and  Z-pak [azithromycin ]   Assessment / Plan:     Visit Diagnoses: No diagnosis found.  ***  Orders: No orders of the defined types were placed in this encounter.  No orders of the defined types were placed in this encounter.    Follow-Up Instructions: No follow-ups on file.   Matt Song, MD  Note - This record has been created using [employer].  Chart creation errors have been sought, but may not always  have been located. Such creation errors do not reflect on  the standard of medical care.

## 2023-10-05 NOTE — Progress Notes (Signed)
 Office Visit Note  Patient: Luis Khan             Date of Birth: [birth year]           MRN: [id number]             PCP: de [place], Raymond J, MD Referring: de [place], Raymond J, MD Visit Date: 10/06/2023   Subjective:  Pain of the Right Hip, Pain of the Left Hip, and Follow-up (Hot flash daily at bedtime x 2 months, pain bil legs)    Discussed the use of AI scribe software for clinical note transcription with the patient, who gave verbal consent to proceed.  History of Present Illness   Luis Khan is a 38 y.o. 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 He has current complaints of hip pain and hot flashes.  He experiences cramping pain primarily in his hips and down his legs, which he attributes to a lack of regular exercise due to a busy work schedule. The pain subsides with a regular exercise routine but returns when he is less active. Stretching, particularly targeting the IT band, improves the pain. No visible swelling is noted in the affected areas.  He had a severe norovirus infection in February, requiring IV fluids due to dehydration. He recovered well and resumed his exercise routine, which alleviated his hip pain at that time.  He has been experiencing hot flashes once daily for the past two months, predominantly in the late evening while at rest. The sensation is described as a sudden increase in body temperature lasting for a minute or two, with facial redness, particularly in the ears. No night sweats or flushing elsewhere. He denies recent medication changes and is unsure if the hot flashes began before or after the norovirus infection.    Previous HPI 02/23/23 Luis Khan is a 38 y.o. 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  Feels his arthritis and psoriasis are doing very well.  Does not have any prolonged morning stiffness or requiring over-the-counter medications for joint pain.   Still 19 small area of rashes on the left upper thigh.  He also developed a new area with a frequently raised bump behind the left ear coming and going intermittently improves with topical steroids.  He had a few episodes of positional dizziness usually occur after prolonged time without eating or after having a few drinks.  Blood pressure has been better controlled remains mildly hypertensive.   Previous HPI 11/25/2022 Luis Khan is a 38 y.o. 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 Since last visit his joint symptoms are doing well. He is physically active doing peloton about 5 days per week. He was sick with a persistent sinus infection treated with oral antibiotics in the first half of May. He suffered a syncopal episode with fall and some bruising on hip and elbow while vacationing in [place]  later in May. Was associated with prolonged time in hot bath water and alcohol use maybe some dehydration. No repeat episodes. He had some bumpy, erythematous rash on the central chest after a long day at the [place]  [employer] this resolved fully. Psoriasis rashes have not returned.   Previous HPI 08/10/22 Luis Khan is a 38 y.o. male here for follow up for axial spondylarthritis on Enbrel  50 mg [place] weekly.  So far since her last visit his arthritis  is doing well no recurrent episodes of large knee effusion or requiring intra-articular steroid injections.  He increased his physical exercise now working out up to 5 days a week get some typical muscle soreness with this but no flareup of joint pain.  Feels his flexibility is getting better.  He is motivated due to worsening report on his blood pressure at PCP clinic follow-up though did not start any medications he is working on lifestyle.  Skin rash is doing well on his extremities but is noticing itchy rash on the center of the chest.  This bothers him usually for short time in the morning on a daily basis but is not  severe he has not noticed any particular expansion over time.   Previous HPI 05/05/22 Luis Khan is a 38 y.o. male here for follow up for axial spondyloarthritis after starting Enbrel  50 mg [place] weekly. He has seen a large improvement in symptoms started since about 2 or 3 weeks in and has done well since then. Morning stiffness is now minimal. Flexibility in hips and knees improved. Still taking aleve routine twice daily. Skin rashes are also largely but not completely better on scalp, arms, and hips.   Previous HPI 02/10/22 Luis Khan is a 38 y.o. male here for follow up for evaluation of recurrent left knee effusion and joint pains in shoulders, back, and hips. Labs at initial visit positive for HLA-B27 and elevated ESR and CRP.  Since her last visit he had a left knee steroid injection with Dr. Peggy Bowens with improvement in symptoms as he needs to be functional for an upcoming golf event this weekend.  Otherwise symptoms remain about the same he is still requiring maximal dose of naproxen to mostly control symptoms.   Previous HPI 01/26/2022 Luis Khan is a 38 y.o. male here for evaluation of recurrent left knee effusion also joint pains increased in multiple areas. He has been noticing new or increased symptoms since last summer with left knee effusion and restricted range of motion. He also had ongoing right knee pain with patellar tendonitis.  Imaging without significant OA changes and knee aspiration with moderate WBC increase otherwise bland. The knee effusion remained improved for months and he participated in PT for several months with a good benefit in his symptoms. However over time and especially since finishing PT symptoms are worsening again now including mid back pain, shoulders, hips, and knee pains. His back pain is most severe at night and first thing in the mornings. He sees a large but incomplete improvement with taking 2 tablets of naproxen, most effective at night time  to reduce morning symptoms. He had recurrence of left knee effusion and aspiration by Dr. Peggy Bowens with findings of elevated cell count both RBCs and WBCs 7398. Previous aspiration from a year ago with elevated WBCs 4067. He does have a history of skin psoriasis, referred to new dermatology last October for increase in symptoms and he is controlling this with topical clobetasol . He has chronic GI symptoms attributed to IBS and also history of celiac disease. Currently these symptoms are at about normal baseline. He was also seeing his eye doctor recent with recurrent styes or hordeolum, but no history of iritis or uveitis known.    Labs reviewed 02/2021 RF neg ANA neg ESR 4 CRP 5   Imaging reviewed 02/10/2021 MRI Left knee IMPRESSION: Focal mild bony edema within the medial aspect of the mid patella, near the MPFL/medial retinacular attachment. The MPFL/medial retinaculum  appear intact. This could represent a focal bony contusion or traction injury.   Moderate-sized joint effusion.   No evidence of meniscus tear.   Review of Systems  Constitutional:  Negative for fatigue.  HENT:  Negative for mouth sores and mouth dryness.   Eyes:  Negative for dryness.  Respiratory:  Negative for shortness of breath.   Cardiovascular:  Negative for chest pain and palpitations.  Gastrointestinal:  Negative for blood in stool, constipation and diarrhea.  Endocrine: Negative for increased urination.  Genitourinary:  Negative for involuntary urination.  Musculoskeletal:  Positive for joint pain, joint pain, myalgias and myalgias. Negative for gait problem, joint swelling, muscle weakness, morning stiffness and muscle tenderness.  Skin:  Negative for color change, rash, hair loss and sensitivity to sunlight.  Allergic/Immunologic: Negative for susceptible to infections.  Neurological:  Negative for dizziness and headaches.  Hematological:  Negative for swollen glands.  Psychiatric/Behavioral:  Negative  for depressed mood and sleep disturbance. The patient is not nervous/anxious.     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/04/2021   Knee pain, chronic 03/04/2021   Allergy, unspecified, initial encounter 02/17/2021   Gastroesophageal reflux disease 04/09/2015    Past Medical History:  Diagnosis Date   Allergy    Anxiety    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   Blood transfusion without reported diagnosis    Chronic allergic rhinitis    Flat feet, bilateral    GERD (gastroesophageal reflux disease) 10/03/2014   NASH (nonalcoholic steatohepatitis)    Patellar tendinitis    Right Knee   Premature labor, onset of delivery before 37 weeks of gestation, fetus 5 07/09/2015   Psoriasis 09/2814    Family History  Problem Relation Age of Onset   Irritable bowel syndrome Mother    Kidney Stones Mother    Hypertension Father    Gallstones Father    Hyperlipidemia Father    Pancreatic cancer Maternal Grandmother    Pancreatic disease Maternal Grandmother    Cancer Maternal Grandmother    Other Maternal Grandmother        Disk fusion   Dementia Maternal Grandfather    Heart attack Maternal Grandfather    Gallstones Paternal Grandmother    Drug abuse Paternal Grandmother    Hypertension Paternal Grandmother    Kidney Stones Paternal Grandmother    Congestive Heart Failure Paternal Grandfather     Coronary artery disease Paternal Grandfather    Heart attack Paternal Grandfather    Diabetes Paternal Grandfather    Healthy Daughter    Gallbladder disease Maternal Aunt    Kidney Stones Maternal Aunt    Diabetes Mellitus II Maternal Uncle    Past Surgical History:  Procedure Laterality Date   CHOLECYSTECTOMY N/A 07/23/2016   Procedure: LAPAROSCOPIC CHOLECYSTECTOMY WITH INTRAOPERATIVE CHOLANGIOGRAM;  Surgeon: Benancio Bracket, MD;  Location: [facility] ORS;  Service: General;  Laterality: N/A;   ESOPHAGOGASTRODUODENOSCOPY (EGD) WITH PROPOFOL  N/A 08/01/2015   Procedure: ESOPHAGOGASTRODUODENOSCOPY (EGD) WITH PROPOFOL ;  Surgeon: Deveron Fly, MD;  Location: [facility] ENDOSCOPY;  Service: Endoscopy;  Laterality: N/A;   WISDOM TOOTH EXTRACTION     Social History   Social History Narrative   Not on file   Immunization History  Administered Date(s) Administered   Influenza, Seasonal, Injecte, Preservative Fre 02/11/2023   Influenza,inj,Quad PF,6+ Mos 04/03/2019, 03/21/2022   Influenza-Unspecified 03/02/2018, 02/27/2020, 04/03/2021   Tdap 01/31/2018     Objective: Vital Signs: BP 127/87 (BP Location: Right Arm, Patient Position: Sitting, Cuff Size: Normal)   Pulse 65   Resp 14   Ht 6\' 5"  (1.956 m)   Wt 255 lb (115.7 kg)   BMI 30.24 kg/m    Physical Exam HENT:     Mouth/Throat:     Mouth: Mucous membranes are moist.     Pharynx: Oropharynx is clear.  Eyes:     Conjunctiva/sclera: Conjunctivae normal.  Cardiovascular:     Rate and Rhythm: Normal rate and regular rhythm.  Pulmonary:     Effort: Pulmonary effort is normal.     Breath sounds: Normal breath sounds.  Musculoskeletal:     Right lower leg: No edema.     Left lower leg: No edema.  Lymphadenopathy:     Cervical: No cervical adenopathy.  Skin:    General: Skin is warm and dry.     Findings: No rash.  Neurological:     Mental Status: He is alert.  Psychiatric:        Mood and Affect: Mood normal.       Musculoskeletal Exam:  Shoulders full ROM no tenderness or swelling Elbows full ROM no tenderness or swelling Wrists full ROM no tenderness or swelling Fingers full ROM no tenderness or swelling Left hip internal rotation slightly less than right, without significant pain or any tenderness to pressure Knees full ROM no tenderness or swelling Ankles full ROM no tenderness or swelling   Investigation: No additional findings.  Imaging: No results found.  Recent Labs: Lab Results  Component Value Date   WBC 5.5 10/06/2023   HGB 16.5 10/06/2023   PLT 253 10/06/2023   NA 139 10/06/2023   K 4.6 10/06/2023   CL 104 10/06/2023   CO2 30 10/06/2023   GLUCOSE 82 10/06/2023   BUN 19 10/06/2023   CREATININE 0.86 10/06/2023   BILITOT 0.8 10/06/2023   ALKPHOS 83 08/08/2023   AST 53 (H) 10/06/2023   ALT 94 (H) 10/06/2023   PROT 7.3 10/06/2023   ALBUMIN 4.3 08/08/2023   CALCIUM 9.3 10/06/2023   GFRAA 107 12/26/2018   QFTBGOLDPLUS NEGATIVE 11/25/2022    Speciality Comments: No specialty comments available.  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 Intermittent hip and leg pain possibly linked to exercise. Considered reactive arthritis due to HLA B27 positivity and past gastrointestinal infection. Symptoms atypical for reactive arthritis though. - Order blood tests for inflammation also including immunoglobulin titers to evaluate for autoimmune disease activity both for arthritis and consitutional/vascular symptoms - Continue Enbrel  50 mg [place] weekly  High risk medication use - Plan: CBC with Differential/Platelet, Comprehensive metabolic  panel with GFR, IgG, IgA, IgM Significant viral GI infection but not sure this is drug related since he had known exposure and highly infectious  pathogen regardless. -Checking CBC and CMP for medication monitoring on long term use of Enbrel   Vasovagal syncope Abnormal flushing and sweating - Plan: IgG, IgA, IgM Hot flashes Daily evening hot flashes for two months, without night sweats. Possible vasomotor phenomenon unrelated to reactive arthritis or medication. Discussed potential immune system activity changes at night. Flushing also could indicate histamine or autonomic/vagal process. - Order blood tests to rule out underlying conditions as above        Orders: Orders Placed This Encounter  Procedures   Sedimentation rate   C-reactive protein   CBC with Differential/Platelet   Comprehensive metabolic panel with GFR   IgG, IgA, IgM   No orders of the defined types were placed in this encounter.    Follow-Up Instructions: Return in about 3 months (around 01/06/2024) for AS on ENB f/u 3mos.   Matt Song, MD  Note - This record has been created using [employer].  Chart creation errors have been sought, but may not always  have been located. Such creation errors do not reflect on  the standard of medical care.

## 2023-10-06 ENCOUNTER — Ambulatory Visit: Attending: Internal Medicine | Admitting: Internal Medicine

## 2023-10-06 ENCOUNTER — Ambulatory Visit: Admitting: Internal Medicine

## 2023-10-06 ENCOUNTER — Encounter: Payer: Self-pay | Admitting: Internal Medicine

## 2023-10-06 VITALS — BP 127/87 | HR 65 | Resp 14 | Ht 77.0 in | Wt 255.0 lb

## 2023-10-06 DIAGNOSIS — R61 Generalized hyperhidrosis: Secondary | ICD-10-CM | POA: Diagnosis not present

## 2023-10-06 DIAGNOSIS — F411 Generalized anxiety disorder: Secondary | ICD-10-CM | POA: Diagnosis not present

## 2023-10-06 DIAGNOSIS — Z79899 Other long term (current) drug therapy: Secondary | ICD-10-CM | POA: Diagnosis not present

## 2023-10-06 DIAGNOSIS — R55 Syncope and collapse: Secondary | ICD-10-CM | POA: Diagnosis not present

## 2023-10-06 DIAGNOSIS — R232 Flushing: Secondary | ICD-10-CM | POA: Diagnosis not present

## 2023-10-06 DIAGNOSIS — M452 Ankylosing spondylitis of cervical region: Secondary | ICD-10-CM | POA: Diagnosis not present

## 2023-10-07 LAB — COMPREHENSIVE METABOLIC PANEL WITH GFR
AG Ratio: 1.6 (calc) (ref 1.0–2.5)
ALT: 94 U/L — ABNORMAL HIGH (ref 9–46)
AST: 53 U/L — ABNORMAL HIGH (ref 10–40)
Albumin: 4.5 g/dL (ref 3.6–5.1)
Alkaline phosphatase (APISO): 66 U/L (ref 36–130)
BUN: 19 mg/dL (ref 7–25)
CO2: 30 mmol/L (ref 20–32)
Calcium: 9.3 mg/dL (ref 8.6–10.3)
Chloride: 104 mmol/L (ref 98–110)
Creat: 0.86 mg/dL (ref 0.60–1.26)
Globulin: 2.8 g/dL (ref 1.9–3.7)
Glucose, Bld: 82 mg/dL (ref 65–99)
Potassium: 4.6 mmol/L (ref 3.5–5.3)
Sodium: 139 mmol/L (ref 135–146)
Total Bilirubin: 0.8 mg/dL (ref 0.2–1.2)
Total Protein: 7.3 g/dL (ref 6.1–8.1)
eGFR: 113 mL/min/{1.73_m2} (ref >=60)

## 2023-10-07 LAB — CBC WITH DIFFERENTIAL/PLATELET
Absolute Lymphocytes: 2123 {cells}/uL (ref 850–3900)
Absolute Monocytes: 556 {cells}/uL (ref 200–950)
Basophils Absolute: 28 {cells}/uL (ref 0–200)
Basophils Relative: 0.5 %
Eosinophils Absolute: 149 {cells}/uL (ref 15–500)
Eosinophils Relative: 2.7 %
HCT: 48.4 % (ref 38.5–50.0)
Hemoglobin: 16.5 g/dL (ref 13.2–17.1)
MCH: 32 pg (ref 27.0–33.0)
MCHC: 34.1 g/dL (ref 32.0–36.0)
MCV: 94 fL (ref 80.0–100.0)
MPV: 10.1 fL (ref 7.5–12.5)
Monocytes Relative: 10.1 %
Neutro Abs: 2646 {cells}/uL (ref 1500–7800)
Neutrophils Relative %: 48.1 %
Platelets: 253 10*3/uL (ref 140–400)
RBC: 5.15 10*6/uL (ref 4.20–5.80)
RDW: 13.1 % (ref 11.0–15.0)
Total Lymphocyte: 38.6 %
WBC: 5.5 10*3/uL (ref 3.8–10.8)

## 2023-10-07 LAB — SEDIMENTATION RATE: Sed Rate: 2 mm/h (ref 0–15)

## 2023-10-07 LAB — IGG, IGA, IGM
IgG (Immunoglobin G), Serum: 1446 mg/dL (ref 600–1640)
IgM, Serum: 68 mg/dL (ref 50–300)
Immunoglobulin A: 447 mg/dL — ABNORMAL HIGH (ref 47–310)

## 2023-10-07 LAB — C-REACTIVE PROTEIN: CRP: <3 mg/L (ref <8.0)

## 2023-10-12 ENCOUNTER — Other Ambulatory Visit: Payer: Self-pay

## 2023-10-14 ENCOUNTER — Other Ambulatory Visit: Payer: Self-pay

## 2023-10-14 ENCOUNTER — Other Ambulatory Visit: Payer: Self-pay | Admitting: Internal Medicine

## 2023-10-14 ENCOUNTER — Other Ambulatory Visit (HOSPITAL_COMMUNITY): Payer: Self-pay

## 2023-10-14 DIAGNOSIS — M452 Ankylosing spondylitis of cervical region: Secondary | ICD-10-CM

## 2023-10-14 DIAGNOSIS — Z79899 Other long term (current) drug therapy: Secondary | ICD-10-CM

## 2023-10-14 DIAGNOSIS — L409 Psoriasis, unspecified: Secondary | ICD-10-CM

## 2023-10-14 MED ORDER — ENBREL MINI 50 MG/ML ~~LOC~~ SOCT
50.0000 mg | SUBCUTANEOUS | 0 refills | Status: DC
  Filled 2023-10-17: qty 4, 28d supply, fill #0
  Filled 2023-11-14: qty 4, 28d supply, fill #1
  Filled 2023-12-12: qty 4, 28d supply, fill #2

## 2023-10-14 NOTE — Telephone Encounter (Signed)
 Last Fill: 09/21/2023  Labs: 10/06/2023 AST 53, ALT 94 CB WNL  TB Gold: 11/25/2022 Neg    Next Visit: 01/31/2024  Last Visit: 10/06/2023  XB:[id number] spondyloarthritis with involvement of peripheral joint   Current Dose per office note 10/06/2023: not discussed, chart no complete  Okay to refill Enbrel ?

## 2023-10-14 NOTE — Progress Notes (Signed)
 Specialty Pharmacy Refill Coordination Note  Luis Khan is a 38 y.o. male contacted today regarding refills of specialty medication(s) Etanercept  (Enbrel  Mini)   Patient requested Delivery   Delivery date: 10/18/23   Verified address: [address]   [name] [address]   Medication Luis be filled on 10/17/23.    This fill date is pending response to refill request from provider. Patient is aware and if they have not received fill by intended date, they must follow up with pharmacy.

## 2023-10-17 ENCOUNTER — Other Ambulatory Visit (HOSPITAL_COMMUNITY): Payer: Self-pay

## 2023-10-17 ENCOUNTER — Other Ambulatory Visit: Payer: Self-pay

## 2023-10-19 ENCOUNTER — Ambulatory Visit: Payer: Self-pay | Admitting: Internal Medicine

## 2023-10-19 DIAGNOSIS — F411 Generalized anxiety disorder: Secondary | ICD-10-CM | POA: Diagnosis not present

## 2023-10-27 DIAGNOSIS — F411 Generalized anxiety disorder: Secondary | ICD-10-CM | POA: Diagnosis not present

## 2023-11-03 ENCOUNTER — Other Ambulatory Visit (HOSPITAL_COMMUNITY): Payer: Self-pay

## 2023-11-03 DIAGNOSIS — F411 Generalized anxiety disorder: Secondary | ICD-10-CM | POA: Diagnosis not present

## 2023-11-03 DIAGNOSIS — H02883 Meibomian gland dysfunction of right eye, unspecified eyelid: Secondary | ICD-10-CM | POA: Diagnosis not present

## 2023-11-03 DIAGNOSIS — H0014 Chalazion left upper eyelid: Secondary | ICD-10-CM | POA: Diagnosis not present

## 2023-11-03 MED ORDER — DOXYCYCLINE HYCLATE 50 MG PO CAPS
50.0000 mg | ORAL_CAPSULE | Freq: Every day | ORAL | 3 refills | Status: AC
  Filled 2023-11-03: qty 30, 30d supply, fill #0

## 2023-11-09 ENCOUNTER — Other Ambulatory Visit: Payer: Self-pay

## 2023-11-09 ENCOUNTER — Other Ambulatory Visit (HOSPITAL_COMMUNITY): Payer: Self-pay

## 2023-11-09 ENCOUNTER — Other Ambulatory Visit (HOSPITAL_BASED_OUTPATIENT_CLINIC_OR_DEPARTMENT_OTHER): Payer: Self-pay | Admitting: Family Medicine

## 2023-11-09 DIAGNOSIS — K219 Gastro-esophageal reflux disease without esophagitis: Secondary | ICD-10-CM

## 2023-11-09 MED ORDER — PANTOPRAZOLE SODIUM 40 MG PO TBEC
40.0000 mg | DELAYED_RELEASE_TABLET | Freq: Every day | ORAL | 1 refills | Status: DC
  Filled 2023-11-09: qty 90, 90d supply, fill #0
  Filled 2024-02-05: qty 90, 90d supply, fill #1

## 2023-11-09 MED ORDER — ESCITALOPRAM OXALATE 5 MG PO TABS
5.0000 mg | ORAL_TABLET | Freq: Every day | ORAL | 1 refills | Status: DC
  Filled 2023-11-09: qty 90, 90d supply, fill #0
  Filled 2024-02-05: qty 90, 90d supply, fill #1

## 2023-11-10 ENCOUNTER — Other Ambulatory Visit: Payer: Self-pay

## 2023-11-10 DIAGNOSIS — F411 Generalized anxiety disorder: Secondary | ICD-10-CM | POA: Diagnosis not present

## 2023-11-14 ENCOUNTER — Other Ambulatory Visit: Payer: Self-pay

## 2023-11-14 ENCOUNTER — Encounter (INDEPENDENT_AMBULATORY_CARE_PROVIDER_SITE_OTHER): Payer: Self-pay

## 2023-11-14 NOTE — Progress Notes (Signed)
 Specialty Pharmacy Refill Coordination Note  Luis Khan is a 38 y.o. male contacted today regarding refills of specialty medication(s) Etanercept  (Enbrel  Mini)   Patient requested (Patient-Rptd) Delivery   Delivery date: 11/16/23   Verified address: (Patient-Rptd) [address] [place] [address]   Medication Luis be filled on 11/15/2023.

## 2023-11-16 ENCOUNTER — Other Ambulatory Visit: Payer: Self-pay

## 2023-11-24 DIAGNOSIS — F411 Generalized anxiety disorder: Secondary | ICD-10-CM | POA: Diagnosis not present

## 2023-12-01 DIAGNOSIS — F411 Generalized anxiety disorder: Secondary | ICD-10-CM | POA: Diagnosis not present

## 2023-12-07 DIAGNOSIS — F411 Generalized anxiety disorder: Secondary | ICD-10-CM | POA: Diagnosis not present

## 2023-12-12 ENCOUNTER — Other Ambulatory Visit: Payer: Self-pay

## 2023-12-12 ENCOUNTER — Encounter (INDEPENDENT_AMBULATORY_CARE_PROVIDER_SITE_OTHER): Payer: Self-pay

## 2023-12-12 ENCOUNTER — Other Ambulatory Visit: Payer: Self-pay | Admitting: Pharmacy Technician

## 2023-12-12 NOTE — Progress Notes (Signed)
 Specialty Pharmacy Refill Coordination Note  Luis Khan is a 38 y.o. male contacted today regarding refills of specialty medication(s)  Etanercept  (Enbrel  Mini)    Patient requested (Patient-Rptd) Delivery   Delivery date: 12/14/23 Verified address: (Patient-Rptd) [address], [place], [place] [address]   Medication Luis be filled on 12/13/23.

## 2023-12-15 DIAGNOSIS — F411 Generalized anxiety disorder: Secondary | ICD-10-CM | POA: Diagnosis not present

## 2023-12-20 DIAGNOSIS — F411 Generalized anxiety disorder: Secondary | ICD-10-CM | POA: Diagnosis not present

## 2023-12-28 DIAGNOSIS — F411 Generalized anxiety disorder: Secondary | ICD-10-CM | POA: Diagnosis not present

## 2023-12-29 ENCOUNTER — Other Ambulatory Visit (HOSPITAL_COMMUNITY): Payer: Self-pay

## 2023-12-30 ENCOUNTER — Encounter (INDEPENDENT_AMBULATORY_CARE_PROVIDER_SITE_OTHER): Payer: Self-pay

## 2023-12-30 ENCOUNTER — Other Ambulatory Visit: Payer: Self-pay

## 2023-12-30 ENCOUNTER — Other Ambulatory Visit: Payer: Self-pay | Admitting: Internal Medicine

## 2023-12-30 DIAGNOSIS — Z79899 Other long term (current) drug therapy: Secondary | ICD-10-CM

## 2023-12-30 DIAGNOSIS — M452 Ankylosing spondylitis of cervical region: Secondary | ICD-10-CM

## 2023-12-30 DIAGNOSIS — Z111 Encounter for screening for respiratory tuberculosis: Secondary | ICD-10-CM

## 2023-12-30 DIAGNOSIS — L409 Psoriasis, unspecified: Secondary | ICD-10-CM

## 2023-12-30 NOTE — Progress Notes (Signed)
 Specialty Pharmacy Refill Coordination Note  Luis Khan is a 38 y.o. male contacted today regarding refills of specialty medication(s) Etanercept  (Enbrel  Mini)   Patient requested (Patient-Rptd) Delivery   Delivery date: 01/04/24   Verified address: (Patient-Rptd) [address] [place], [place] [address]   Medication Luis be filled on 07.29.25.

## 2023-12-30 NOTE — Telephone Encounter (Signed)
 Last Fill: 10/14/2023  Labs: 10/06/2023 Sed rate and CRP are normal. His IgA level was slightly high at 447 but other antibody levels were normal and I don't think this would account for significant extra immune system weakness. Liver enzymes got worse again. There was no change in medicine to account for this. I do see his weight increased somewhat, making fatty liver disease changes possible.   TB Gold: 11/25/2022   [facility] for patient to update.   Next Visit: 01/31/2024  Last Visit: 10/06/2023  DX: 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   Current Dose per office note 10/06/2023: Enbrel  50 mg [place] weekly   Okay to refill Enbrel ?

## 2024-01-02 ENCOUNTER — Other Ambulatory Visit: Payer: Self-pay

## 2024-01-02 MED ORDER — ENBREL MINI 50 MG/ML ~~LOC~~ SOCT
50.0000 mg | SUBCUTANEOUS | 0 refills | Status: DC
Start: 2024-01-02 — End: 2024-03-29
  Filled 2024-01-02: qty 4, 28d supply, fill #0
  Filled 2024-01-20: qty 4, 28d supply, fill #1
  Filled 2024-02-23: qty 4, 28d supply, fill #2

## 2024-01-03 DIAGNOSIS — F411 Generalized anxiety disorder: Secondary | ICD-10-CM | POA: Diagnosis not present

## 2024-01-12 DIAGNOSIS — H52223 Regular astigmatism, bilateral: Secondary | ICD-10-CM | POA: Diagnosis not present

## 2024-01-16 ENCOUNTER — Ambulatory Visit (INDEPENDENT_AMBULATORY_CARE_PROVIDER_SITE_OTHER): Admitting: Family Medicine

## 2024-01-16 VITALS — BP 128/84 | Ht 77.0 in | Wt 245.0 lb

## 2024-01-16 DIAGNOSIS — R269 Unspecified abnormalities of gait and mobility: Secondary | ICD-10-CM | POA: Diagnosis not present

## 2024-01-16 NOTE — Progress Notes (Signed)
 Patient returned today for 2 new pair of sports insoles - given with medium scaphoid pads.  Right side lateral heel wedge and lateral posting similar to previous inserts.  Felt comfortable.

## 2024-01-17 NOTE — Progress Notes (Signed)
 Office Visit Note  Patient: Luis Khan             Date of Birth: Mar 20, 1986           MRN: [id number]             PCP: de Peru, Raymond J, MD Referring: de Peru, Raymond J, MD Visit Date: 01/31/2024   Subjective:   Discussed the use of AI scribe software for clinical note transcription with the patient, who gave verbal consent to proceed.  History of Present Illness   Luis Khan is a 38 y.o. 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    He has not been exercising as much as he would like, a situation that started before a recent family bereavement after unexpected death of his father-in-law and has worsened since. Despite this, he feels good overall with no pain or flare-ups of his condition.  He has not experienced any recent illnesses, including viral infections or conditions requiring antibiotics. He mentions that, despite not exercising as much as he would like, he has been moving a lot and doing things, which he feels has kept his body fresher than usual.  He has not been sick with any significant illness since our last visit.     Previous HPI 10/06/2023 Luis Khan is a 38 y.o. 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 He has current complaints of hip pain and hot flashes.   He experiences cramping pain primarily in his hips and down his legs, which he attributes to a lack of regular exercise due to a busy work schedule. The pain subsides with a regular exercise routine but returns when he is less active. Stretching, particularly targeting the IT band, improves the pain. No visible swelling is noted in the affected areas.   He had a severe norovirus infection in February, requiring IV fluids due to dehydration. He recovered well and resumed his exercise routine, which alleviated his hip pain at that time.   He has been experiencing hot flashes once daily for the past two months, predominantly  in the late evening while at rest. The sensation is described as a sudden increase in body temperature lasting for a minute or two, with facial redness, particularly in the ears. No night sweats or flushing elsewhere. He denies recent medication changes and is unsure if the hot flashes began before or after the norovirus infection.   Previous HPI 02/23/23 Luis Khan is a 37 y.o. 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  Feels his arthritis and psoriasis are doing very well.  Does not have any prolonged morning stiffness or requiring over-the-counter medications for joint pain.  Still 19 small area of rashes on the left upper thigh.  He also developed a new area with a frequently raised bump behind the left ear coming and going intermittently improves with topical steroids.  He had a few episodes of positional dizziness usually occur after prolonged time without eating or after having a few drinks.  Blood pressure has been better controlled remains mildly hypertensive.   Previous HPI 11/25/2022 Luis Khan is a 38 y.o. 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 Since last visit his joint symptoms are doing well. He is physically active doing peloton about 5 days per week. He was sick with a persistent sinus  infection treated with oral antibiotics in the first half of May. He suffered a syncopal episode with fall and some bruising on hip and elbow while vacationing in [place]  later in May. Was associated with prolonged time in hot bath water and alcohol use maybe some dehydration. No repeat episodes. He had some bumpy, erythematous rash on the central chest after a long day at the [place] this resolved fully. Psoriasis rashes have not returned.   Previous HPI 08/10/22 Luis Khan is a 38 y.o. male here for follow up for axial spondylarthritis on Enbrel  50 mg [place] weekly.  So far since her last visit his arthritis is  doing well no recurrent episodes of large knee effusion or requiring intra-articular steroid injections.  He increased his physical exercise now working out up to 5 days a week get some typical muscle soreness with this but no flareup of joint pain.  Feels his flexibility is getting better.  He is motivated due to worsening report on his blood pressure at PCP clinic follow-up though did not start any medications he is working on lifestyle.  Skin rash is doing well on his extremities but is noticing itchy rash on the center of the chest.  This bothers him usually for short time in the morning on a daily basis but is not severe he has not noticed any particular expansion over time.   Previous HPI 05/05/22 Luis Khan is a 38 y.o. male here for follow up for axial spondyloarthritis after starting Enbrel  50 mg [place] weekly. He has seen a large improvement in symptoms started since about 2 or 3 weeks in and has done well since then. Morning stiffness is now minimal. Flexibility in hips and knees improved. Still taking aleve routine twice daily. Skin rashes are also largely but not completely better on scalp, arms, and hips.   Previous HPI 02/10/22 Luis Khan is a 38 y.o. male here for follow up for evaluation of recurrent left knee effusion and joint pains in shoulders, back, and hips. Labs at initial visit positive for HLA-B27 and elevated ESR and CRP.  Since her last visit he had a left knee steroid injection with Dr. Cleatrice with improvement in symptoms as he needs to be functional for an upcoming golf event this weekend.  Otherwise symptoms remain about the same he is still requiring maximal dose of naproxen to mostly control symptoms.   Previous HPI 01/26/2022 Luis Khan is a 38 y.o. male here for evaluation of recurrent left knee effusion also joint pains increased in multiple areas. He has been noticing new or increased symptoms since last summer with left knee effusion and restricted range  of motion. He also had ongoing right knee pain with patellar tendonitis.  Imaging without significant OA changes and knee aspiration with moderate WBC increase otherwise bland. The knee effusion remained improved for months and he participated in PT for several months with a good benefit in his symptoms. However over time and especially since finishing PT symptoms are worsening again now including mid back pain, shoulders, hips, and knee pains. His back pain is most severe at night and first thing in the mornings. He sees a large but incomplete improvement with taking 2 tablets of naproxen, most effective at night time to reduce morning symptoms. He had recurrence of left knee effusion and aspiration by Dr. Cleatrice with findings of elevated cell count both RBCs and WBCs 7398. Previous aspiration from a year ago with elevated WBCs 4067. He  does have a history of skin psoriasis, referred to new dermatology last October for increase in symptoms and he is controlling this with topical clobetasol . He has chronic GI symptoms attributed to IBS and also history of celiac disease. Currently these symptoms are at about normal baseline. He was also seeing his eye doctor recent with recurrent styes or hordeolum, but no history of iritis or uveitis known.    Labs reviewed 02/2021 RF neg ANA neg ESR 4 CRP 5   Imaging reviewed 02/10/2021 MRI Left knee IMPRESSION: Focal mild bony edema within the medial aspect of the mid patella, near the MPFL/medial retinacular attachment. The MPFL/medial retinaculum appear intact. This could represent a focal bony contusion or traction injury.   Moderate-sized joint effusion.   No evidence of meniscus tear.  Review of Systems  Constitutional:  Negative for fatigue.  HENT:  Negative for mouth sores and mouth dryness.   Eyes:  Negative for dryness.  Respiratory:  Negative for shortness of breath.   Cardiovascular:  Negative for chest pain and palpitations.   Gastrointestinal:  Negative for blood in stool, constipation and diarrhea.  Endocrine: Negative for increased urination.  Genitourinary:  Negative for involuntary urination.  Musculoskeletal:  Negative for joint pain, gait problem, joint pain, joint swelling, myalgias, muscle weakness, morning stiffness, muscle tenderness and myalgias.  Skin:  Negative for color change, rash, hair loss and sensitivity to sunlight.  Allergic/Immunologic: Positive for susceptible to infections.  Neurological:  Negative for dizziness and headaches.  Hematological:  Negative for swollen glands.  Psychiatric/Behavioral:  Negative for depressed mood and sleep disturbance. The patient is not nervous/anxious.     PMFS History:  Patient Active Problem List   Diagnosis Date Noted  . Abnormal flushing and sweating 10/06/2023  . [place]azion 08/15/2023  . Vasovagal syncope 11/25/2022  . Generalized anxiety disorder 09/13/2022  . Elevated blood pressure reading in office without diagnosis of hypertension 07/22/2022  . 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 Low back pain 01/26/2022  . High risk medication use 01/26/2022  . Wellness examination 07/21/2021  . Environmental and seasonal allergies 03/17/2021  . Psoriasis 03/17/2021  . 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 Knee pain, chronic 03/04/2021  . Allergy, unspecified, initial encounter 02/17/2021  . Gastroesophageal reflux disease 04/09/2015    Past Medical History:  Diagnosis Date  . Allergy   . Anxiety   . 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 Blood transfusion without reported diagnosis   . Chronic allergic rhinitis   . Flat feet, bilateral   . GERD (gastroesophageal reflux disease) 10/03/2014  . NASH (nonalcoholic steatohepatitis)   . Patellar tendinitis    Right Knee  . Premature labor, onset of delivery before 37 weeks of gestation, fetus 5  07/09/2015  . Psoriasis 09/2814    Family History  Problem Relation Age of Onset  . Irritable bowel syndrome Mother   . Kidney Stones Mother   . Hypertension Father   . Gallstones Father   . Hyperlipidemia Father   . Pancreatic cancer Maternal Grandmother   . Pancreatic disease Maternal Grandmother   . Cancer Maternal Grandmother   . Other Maternal Grandmother        Disk fusion  . Dementia Maternal Grandfather   . Heart attack Maternal Grandfather   . Gallstones Paternal Grandmother   . Drug abuse Paternal Grandmother   . Hypertension Paternal Grandmother   . Kidney Stones Paternal Grandmother   .  Congestive Heart Failure Paternal Grandfather   . Coronary artery disease Paternal Grandfather   . Heart attack Paternal Grandfather   . Diabetes Paternal Grandfather   . Healthy Daughter   . Gallbladder disease Maternal Aunt   . Kidney Stones Maternal Aunt   . Diabetes Mellitus II Maternal Uncle    Past Surgical History:  Procedure Laterality Date  . CHOLECYSTECTOMY N/A 07/23/2016   Procedure: LAPAROSCOPIC CHOLECYSTECTOMY WITH INTRAOPERATIVE CHOLANGIOGRAM;  Surgeon: Larinda Unknown Sharps, MD;  Location: [facility] ORS;  Service: General;  Laterality: N/A;  . ESOPHAGOGASTRODUODENOSCOPY (EGD) WITH PROPOFOL  N/A 08/01/2015   Procedure: ESOPHAGOGASTRODUODENOSCOPY (EGD) WITH PROPOFOL ;  Surgeon: Gladis RAYMOND Mariner, MD;  Location: [facility] ENDOSCOPY;  Service: Endoscopy;  Laterality: N/A;  . WISDOM TOOTH EXTRACTION     Social History   Social History Narrative  . Not on file   Immunization History  Administered Date(s) Administered  . Influenza, Seasonal, Injecte, Preservative Fre 02/11/2023  . Influenza,inj,Quad PF,6+ Mos 04/03/2019, 03/21/2022  . Influenza-Unspecified 03/02/2018, 02/27/2020, 04/03/2021  . Tdap 01/31/2018     Objective: Vital Signs: BP 123/77 (BP Location: Left Arm, Patient Position: Sitting, Cuff Size: Normal)   Pulse 68   Resp 14   Ht 6' 5 (1.956 m)   Wt 254 lb  (115.2 kg)   BMI 30.12 kg/m    Physical Exam Eyes:     Conjunctiva/sclera: Conjunctivae normal.  Cardiovascular:     Rate and Rhythm: Normal rate and regular rhythm.  Pulmonary:     Effort: Pulmonary effort is normal.     Breath sounds: Normal breath sounds.  Musculoskeletal:     Right lower leg: No edema.     Left lower leg: No edema.  Lymphadenopathy:     Cervical: No cervical adenopathy.  Skin:    General: Skin is warm and dry.     Findings: No rash.  Neurological:     Mental Status: He is alert.  Psychiatric:        Mood and Affect: Mood normal.      Musculoskeletal Exam:  Shoulders full ROM no tenderness or swelling Elbows full ROM no tenderness or swelling Wrists full ROM no tenderness or swelling Fingers full ROM no tenderness or swelling Knees full ROM no tenderness or swelling Ankles full ROM no tenderness or swelling   Investigation: No additional findings.  Imaging: No results found.  Recent Labs: Lab Results  Component Value Date   WBC 5.5 10/06/2023   HGB 16.5 10/06/2023   PLT 253 10/06/2023   NA 139 10/06/2023   K 4.6 10/06/2023   CL 104 10/06/2023   CO2 30 10/06/2023   GLUCOSE 82 10/06/2023   BUN 19 10/06/2023   CREATININE 0.86 10/06/2023   BILITOT 0.8 10/06/2023   ALKPHOS 83 08/08/2023   AST 53 (H) 10/06/2023   ALT 94 (H) 10/06/2023   PROT 7.3 10/06/2023   ALBUMIN 4.3 08/08/2023   CALCIUM 9.3 10/06/2023   GFRAA 107 12/26/2018   QFTBGOLDPLUS NEGATIVE 11/25/2022    Speciality Comments: No specialty comments available.  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  No significant daily pain or stiffness. Slight  decrease in physical activity due to work and family circumstances and stress recently. - Rechecking CRP for inflammatory disease  activity monitoring - Continue Enbrel  50 mg [place] weekly  High risk medication use - Enbrel  50 mg [place] weekly. - Plan: CBC with Differential/Platelet, Comprehensive metabolic panel with GFR, QuantiFERON-TB Gold Plus Has been tolerating Enbrel  well without reported specific side effect.  No serious interval infections.  Previously had mild abnormalities in liver enzymes otherwise just due for routine monitoring. -Checking CBC and CMP for medication monitoring on long term use of Enbrel     Orders: Orders Placed This Encounter  Procedures  . C-reactive protein  . CBC with Differential/Platelet  . Comprehensive metabolic panel with GFR  . QuantiFERON-TB Gold Plus   No orders of the defined types were placed in this encounter.    Follow-Up Instructions: Return in about 3 months (around 05/02/2024) for PsA on ENB f/u 3mos.   Lonni LELON Ester, MD  Note - This record has been created using [employer].  Chart creation errors have been sought, but may not always  have been located. Such creation errors do not reflect on  the standard of medical care.

## 2024-01-19 ENCOUNTER — Encounter (INDEPENDENT_AMBULATORY_CARE_PROVIDER_SITE_OTHER): Payer: Self-pay

## 2024-01-19 DIAGNOSIS — F411 Generalized anxiety disorder: Secondary | ICD-10-CM | POA: Diagnosis not present

## 2024-01-20 ENCOUNTER — Other Ambulatory Visit: Payer: Self-pay

## 2024-01-20 ENCOUNTER — Other Ambulatory Visit (HOSPITAL_COMMUNITY): Payer: Self-pay

## 2024-01-20 NOTE — Progress Notes (Signed)
 Specialty Pharmacy Refill Coordination Note  MyChart Questionnaire Submission  [name] is a 38 y.o. male contacted today regarding refills of specialty medication(s) Enbrel .  Doses on hand: (Patient-Rptd) 3 - 1 for 01/16/24, 01/23/24, and 01/30/24  Injection date: 02/06/24  Patient requested: (Patient-Rptd) Delivery   Delivery date: 01/24/24  Verified address: [address] [name] [address]  Medication will be filled on 01/23/24.

## 2024-01-23 ENCOUNTER — Other Ambulatory Visit (HOSPITAL_COMMUNITY): Payer: Self-pay

## 2024-01-31 ENCOUNTER — Ambulatory Visit: Attending: Internal Medicine | Admitting: Internal Medicine

## 2024-01-31 ENCOUNTER — Encounter: Payer: Self-pay | Admitting: Internal Medicine

## 2024-01-31 VITALS — BP 123/77 | HR 68 | Resp 14 | Ht 77.0 in | Wt 254.0 lb

## 2024-01-31 DIAGNOSIS — R55 Syncope and collapse: Secondary | ICD-10-CM

## 2024-01-31 DIAGNOSIS — Z79899 Other long term (current) drug therapy: Secondary | ICD-10-CM | POA: Diagnosis not present

## 2024-01-31 DIAGNOSIS — M452 Ankylosing spondylitis of cervical region: Secondary | ICD-10-CM | POA: Diagnosis not present

## 2024-02-02 LAB — QUANTIFERON-TB GOLD PLUS
Mitogen-NIL: 8.2 [IU]/mL
NIL: 0.01 [IU]/mL
QuantiFERON-TB Gold Plus: NEGATIVE
TB1-NIL: 0 [IU]/mL
TB2-NIL: 0 [IU]/mL

## 2024-02-02 LAB — CBC WITH DIFFERENTIAL/PLATELET
Absolute Lymphocytes: 1604 {cells}/uL (ref 850–3900)
Absolute Monocytes: 378 {cells}/uL (ref 200–950)
Basophils Absolute: 38 {cells}/uL (ref 0–200)
Basophils Relative: 0.9 %
Eosinophils Absolute: 130 {cells}/uL (ref 15–500)
Eosinophils Relative: 3.1 %
HCT: 48.4 % (ref 38.5–50.0)
Hemoglobin: 16.4 g/dL (ref 13.2–17.1)
MCH: 32.9 pg (ref 27.0–33.0)
MCHC: 33.9 g/dL (ref 32.0–36.0)
MCV: 97.2 fL (ref 80.0–100.0)
MPV: 10.5 fL (ref 7.5–12.5)
Monocytes Relative: 9 %
Neutro Abs: 2050 {cells}/uL (ref 1500–7800)
Neutrophils Relative %: 48.8 %
Platelets: 225 Thousand/uL (ref 140–400)
RBC: 4.98 Million/uL (ref 4.20–5.80)
RDW: 12.7 % (ref 11.0–15.0)
Total Lymphocyte: 38.2 %
WBC: 4.2 Thousand/uL (ref 3.8–10.8)

## 2024-02-02 LAB — COMPREHENSIVE METABOLIC PANEL WITH GFR
AG Ratio: 1.4 (calc) (ref 1.0–2.5)
ALT: 99 U/L — ABNORMAL HIGH (ref 9–46)
AST: 46 U/L — ABNORMAL HIGH (ref 10–40)
Albumin: 4.4 g/dL (ref 3.6–5.1)
Alkaline phosphatase (APISO): 59 U/L (ref 36–130)
BUN: 18 mg/dL (ref 7–25)
CO2: 28 mmol/L (ref 20–32)
Calcium: 9.5 mg/dL (ref 8.6–10.3)
Chloride: 104 mmol/L (ref 98–110)
Creat: 0.91 mg/dL (ref 0.60–1.26)
Globulin: 3.2 g/dL (ref 1.9–3.7)
Glucose, Bld: 98 mg/dL (ref 65–99)
Potassium: 4.6 mmol/L (ref 3.5–5.3)
Sodium: 139 mmol/L (ref 135–146)
Total Bilirubin: 1.1 mg/dL (ref 0.2–1.2)
Total Protein: 7.6 g/dL (ref 6.1–8.1)
eGFR: 110 mL/min/1.73m2 (ref >=60)

## 2024-02-02 LAB — C-REACTIVE PROTEIN: CRP: <3 mg/L (ref <8.0)

## 2024-02-05 ENCOUNTER — Other Ambulatory Visit (HOSPITAL_COMMUNITY): Payer: Self-pay

## 2024-02-09 DIAGNOSIS — F411 Generalized anxiety disorder: Secondary | ICD-10-CM | POA: Diagnosis not present

## 2024-02-13 ENCOUNTER — Other Ambulatory Visit: Payer: Self-pay

## 2024-02-14 ENCOUNTER — Ambulatory Visit: Payer: Self-pay | Admitting: Internal Medicine

## 2024-02-15 ENCOUNTER — Encounter (HOSPITAL_BASED_OUTPATIENT_CLINIC_OR_DEPARTMENT_OTHER): Payer: Self-pay | Admitting: Family Medicine

## 2024-02-15 ENCOUNTER — Ambulatory Visit (HOSPITAL_BASED_OUTPATIENT_CLINIC_OR_DEPARTMENT_OTHER): Admitting: Family Medicine

## 2024-02-15 VITALS — BP 149/97 | HR 65 | Ht 77.0 in | Wt 253.6 lb

## 2024-02-15 DIAGNOSIS — Z0000 Encounter for general adult medical examination without abnormal findings: Secondary | ICD-10-CM

## 2024-02-15 DIAGNOSIS — R030 Elevated blood-pressure reading, without diagnosis of hypertension: Secondary | ICD-10-CM | POA: Diagnosis not present

## 2024-02-15 DIAGNOSIS — Z23 Encounter for immunization: Secondary | ICD-10-CM

## 2024-02-15 NOTE — Patient Instructions (Signed)
  Medication Instructions:  Your physician recommends that you continue on your current medications as directed. Please refer to the Current Medication list given to you today. --If you need a refill on any your medications before your next appointment, please call your pharmacy first. If no refills are authorized on file call the office.-- Lab Work: Your physician has recommended that you have lab work today: 1 week before next visit  If you have labs (blood work) drawn today and your tests are completely normal, you will receive your results via MyChart message OR a phone call from our staff.  Please ensure you check your voicemail in the event that you authorized detailed messages to be left on a delegated number. If you have any lab test that is abnormal or we need to change your treatment, we will call you to review the results.  Follow-Up: Your next appointment:   Your physician recommends that you schedule a follow-up appointment in: 6 month physical with Dr. de Peru  You will receive a text message or e-mail with a link to a survey about your care and experience with [place] today! We would greatly appreciate your feedback!   Thanks for letting us be apart of your health journey!!  Primary Care and Sports Medicine   Dr. Ceasar Mons Peru   We encourage you to activate your patient portal called "MyChart".  Sign up information is provided on this After Visit Summary.  MyChart is used to connect with patients for Virtual Visits (Telemedicine).  Patients are able to view lab/test results, encounter notes, upcoming appointments, etc.  Non-urgent messages can be sent to your provider as well. To learn more about what you can do with MyChart, please visit --  [email or web address].

## 2024-02-15 NOTE — Progress Notes (Signed)
    Procedures performed today:    None.  Independent interpretation of notes and tests performed by another provider:   None.  Brief History, Exam, Impression, and Recommendations:    BP (!) 149/97 (BP Location: Right Arm, Patient Position: Sitting, Cuff Size: Normal)   Pulse 65   Ht 6' 5 (1.956 m)   Wt 253 lb 9.6 oz (115 kg)   SpO2 95%   BMI 30.07 kg/m   Elevated blood pressure reading in office without diagnosis of hypertension Assessment & Plan: He has not been check BP as often at home recently.  Patient has not had any symptoms of chest pain, headaches, lightheadedness, dizziness. In office today, blood pressure elevated on initial reading, did improve on recheck. We discussed considerations.  We can continue to monitor blood pressure with primary focus on lifestyle modifications. We will plan for follow-up in about 4 to 6 months for physical.  Recommend monitoring blood pressure intermittently at home.  Recommend return to the office should any issues with blood pressure arise   Wellness examination -     CBC with Differential/Platelet; Future -     Comprehensive metabolic panel with GFR; Future -     Hemoglobin A1c; Future -     Lipid panel; Future -     TSH Rfx on Abnormal to Free T4; Future  Return in about 6 months (around 08/14/2024) for CPE with fasting labs 1 week prior.   ___________________________________________ Stevon Gough de [place], MD, ABFM, CAQSM Primary Care and Sports Medicine [facility]

## 2024-02-15 NOTE — Assessment & Plan Note (Addendum)
 He has not been check BP as often at home recently.  Patient has not had any symptoms of chest pain, headaches, lightheadedness, dizziness. In office today, blood pressure elevated on initial reading, did improve on recheck. We discussed considerations.  We can continue to monitor blood pressure with primary focus on lifestyle modifications. We will plan for follow-up in about 4 to 6 months for physical.  Recommend monitoring blood pressure intermittently at home.  Recommend return to the office should any issues with blood pressure arise

## 2024-02-23 ENCOUNTER — Encounter (INDEPENDENT_AMBULATORY_CARE_PROVIDER_SITE_OTHER): Payer: Self-pay

## 2024-02-23 ENCOUNTER — Other Ambulatory Visit: Payer: Self-pay

## 2024-02-23 ENCOUNTER — Other Ambulatory Visit: Payer: Self-pay | Admitting: Pharmacy Technician

## 2024-02-23 DIAGNOSIS — F411 Generalized anxiety disorder: Secondary | ICD-10-CM | POA: Diagnosis not present

## 2024-02-23 NOTE — Progress Notes (Signed)
 Specialty Pharmacy Refill Coordination Note  Luis Khan is a 38 y.o. male contacted today regarding refills of specialty medication(s) Etanercept  (Enbrel  Mini)   Patient requested Delivery   Delivery date: 03/06/24   Verified address: [address] [place] [address]   Medication Luis be filled on 03/05/24. Patient has 3 inj - 1 for 9/22, 9/29, & 10/6. Answered questionnaire.

## 2024-02-29 DIAGNOSIS — F411 Generalized anxiety disorder: Secondary | ICD-10-CM | POA: Diagnosis not present

## 2024-03-01 ENCOUNTER — Other Ambulatory Visit: Payer: Self-pay

## 2024-03-05 ENCOUNTER — Other Ambulatory Visit: Payer: Self-pay

## 2024-03-05 NOTE — Progress Notes (Signed)
 Specialty Pharmacy Ongoing Clinical Assessment Note  Luis Khan is a 38 y.o. male who is being followed by the specialty pharmacy service for RxSp Ankylosing Spondylitis   Patient's specialty medication(s) reviewed today: Etanercept  (Enbrel  Mini)   Missed doses in the last 4 weeks: 0   Patient/Caregiver did not have any additional questions or concerns.   Therapeutic benefit summary: Patient is achieving benefit   Adverse events/side effects summary: No adverse events/side effects   Patient's therapy is appropriate to: Continue    Goals Addressed             This Visit's Progress    Reduce signs and symptoms   On track    Patient is on track. Patient Luis maintain adherence         Follow up: 12 months  Silvano LOISE Dolly Specialty Pharmacist

## 2024-03-18 ENCOUNTER — Other Ambulatory Visit: Payer: Self-pay | Admitting: Medical Genetics

## 2024-03-18 DIAGNOSIS — Z006 Encounter for examination for normal comparison and control in clinical research program: Secondary | ICD-10-CM

## 2024-03-29 ENCOUNTER — Other Ambulatory Visit: Payer: Self-pay | Admitting: Internal Medicine

## 2024-03-29 ENCOUNTER — Other Ambulatory Visit (HOSPITAL_COMMUNITY): Payer: Self-pay

## 2024-03-29 ENCOUNTER — Encounter (INDEPENDENT_AMBULATORY_CARE_PROVIDER_SITE_OTHER): Payer: Self-pay

## 2024-03-29 ENCOUNTER — Other Ambulatory Visit: Payer: Self-pay

## 2024-03-29 DIAGNOSIS — M452 Ankylosing spondylitis of cervical region: Secondary | ICD-10-CM

## 2024-03-29 DIAGNOSIS — Z79899 Other long term (current) drug therapy: Secondary | ICD-10-CM

## 2024-03-29 DIAGNOSIS — L409 Psoriasis, unspecified: Secondary | ICD-10-CM

## 2024-03-29 DIAGNOSIS — F411 Generalized anxiety disorder: Secondary | ICD-10-CM | POA: Diagnosis not present

## 2024-03-29 MED ORDER — ENBREL MINI 50 MG/ML ~~LOC~~ SOCT
50.0000 mg | SUBCUTANEOUS | 0 refills | Status: DC
  Filled 2024-03-29: qty 12, 84d supply, fill #0
  Filled 2024-03-30: qty 4, 28d supply, fill #0

## 2024-03-29 NOTE — Telephone Encounter (Signed)
 Last Fill: 01/02/2024  Labs: 01/31/2024 Liver enzyme tests remain mildly abnormal with AST of 46 and ALT of 99. These are about the same as from 4 months ago. I do not think we need any medication changes at this time but if it stays elevated or goes up further next time we may have to try either interrupting the Enbrel  to see if it is having an effect or get an ultrasound to take a look at the live   TB Gold: 01/31/2024 Neg    Next Visit: 05/07/2024  Last Visit: 01/31/2024  IK:Jkpjo spondyloarthritis with involvement of peripheral joint   Current Dose per office note 01/31/2024: Enbrel  50 mg [place] weekly   Okay to refill Enbrel ?

## 2024-03-30 ENCOUNTER — Other Ambulatory Visit: Payer: Self-pay

## 2024-03-30 NOTE — Progress Notes (Signed)
 Specialty Pharmacy Refill Coordination Note  Luis Khan is a 38 y.o. male contacted today regarding refills of specialty medication(s) Etanercept  (Enbrel  Mini)   Patient requested (Patient-Rptd) Delivery   Delivery date: 04/03/24   Verified address: (Patient-Rptd) [address] [place] [address]   Medication Luis be filled on 04/02/24.

## 2024-04-02 ENCOUNTER — Other Ambulatory Visit: Payer: Self-pay

## 2024-04-05 DIAGNOSIS — F411 Generalized anxiety disorder: Secondary | ICD-10-CM | POA: Diagnosis not present

## 2024-04-19 DIAGNOSIS — F411 Generalized anxiety disorder: Secondary | ICD-10-CM | POA: Diagnosis not present

## 2024-04-23 ENCOUNTER — Other Ambulatory Visit (HOSPITAL_COMMUNITY): Payer: Self-pay

## 2024-04-24 DIAGNOSIS — F411 Generalized anxiety disorder: Secondary | ICD-10-CM | POA: Diagnosis not present

## 2024-04-26 NOTE — Progress Notes (Unsigned)
 Office Visit Note  Patient: Luis Khan             Date of Birth: 30-Aug-1985           MRN: [id number]             PCP: de [place], Raymond J, MD Referring: de [place], Raymond J, MD Visit Date: 05/07/2024   Subjective:  No chief complaint on file.   History of Present Illness: Luis Khan is a 38 y.o. 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    Discussed the use of AI scribe software for clinical note transcription with the patient, who gave verbal consent to proceed.  History of Present Illness   Luis Khan is a 38 year old male with psoriasis and elevated liver enzymes who presents for follow-up on his treatment with Enbrel .  He has a small patch of psoriasis on his left hip, which is the last remaining spot from previous outbreaks. The area is painful due to breaking the scab, but otherwise it has been stable.  His liver enzymes were about double the normal range during his last lab work in August, which was conducted in conjunction with his Enbrel  treatment.  He has been experiencing elevated blood pressure recently, with readings as high as 160/unknown. He attributes this to increased stress from work and life events. He has been monitoring his blood pressure at home and notes that it has improved to 140/97.  He has been experiencing headaches, which he attributes to dehydration, as he is now more dependent on water intake. No recent illnesses, viral infections, or the need for antibiotics. No joint swelling, back pain, or any unusual heart sensations.  He has probably lost somewhere around ten to fifteen pounds over the last three months after resuming his workout regimen. He has not experienced any issues with his heart rate or breathing during exercise.       Previous HPI 01/31/2024 Luis Khan is a 38 y.o. 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     He has not been  exercising as much as he would like, a situation that started before a recent family bereavement after unexpected death of his father-in-law and has worsened since. Despite this, he feels good overall with no pain or flare-ups of his condition.   He has not experienced any recent illnesses, including viral infections or conditions requiring antibiotics. He mentions that, despite not exercising as much as he would like, he has been moving a lot and doing things, which he feels has kept his body fresher than usual.   He has not been sick with any significant illness since our last visit.      Previous HPI 10/06/2023 Luis Khan is a 38 y.o. 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 He has current complaints of hip pain and hot flashes.   He experiences cramping pain primarily in his hips and down his legs, which he attributes to a lack of regular exercise due to a busy work schedule. The pain subsides with a regular exercise routine but returns when he is less active. Stretching, particularly targeting the IT band, improves the pain. No visible swelling is noted in the affected areas.   He had a severe norovirus infection in February, requiring IV fluids due to dehydration. He recovered well and resumed his exercise routine,  which alleviated his hip pain at that time.   He has been experiencing hot flashes once daily for the past two months, predominantly in the late evening while at rest. The sensation is described as a sudden increase in body temperature lasting for a minute or two, with facial redness, particularly in the ears. No night sweats or flushing elsewhere. He denies recent medication changes and is unsure if the hot flashes began before or after the norovirus infection.   Previous HPI 02/23/23 Luis Khan is a 38 y.o. 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  Feels his arthritis and psoriasis are doing  very well.  Does not have any prolonged morning stiffness or requiring over-the-counter medications for joint pain.  Still 19 small area of rashes on the left upper thigh.  He also developed a new area with a frequently raised bump behind the left ear coming and going intermittently improves with topical steroids.  He had a few episodes of positional dizziness usually occur after prolonged time without eating or after having a few drinks.  Blood pressure has been better controlled remains mildly hypertensive.   Previous HPI 11/25/2022 Luis Khan is a 38 y.o. 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 Since last visit his joint symptoms are doing well. He is physically active doing peloton about 5 days per week. He was sick with a persistent sinus infection treated with oral antibiotics in the first half of May. He suffered a syncopal episode with fall and some bruising on hip and elbow while vacationing in [place]  later in May. Was associated with prolonged time in hot bath water and alcohol use maybe some dehydration. No repeat episodes. He had some bumpy, erythematous rash on the central chest after a long day at the [place]  [employer] this resolved fully. Psoriasis rashes have not returned.   Previous HPI 08/10/22 Luis Khan is a 38 y.o. male here for follow up for axial spondylarthritis on Enbrel  50 mg [place] weekly.  So far since her last visit his arthritis is doing well no recurrent episodes of large knee effusion or requiring intra-articular steroid injections.  He increased his physical exercise now working out up to 5 days a week get some typical muscle soreness with this but no flareup of joint pain.  Feels his flexibility is getting better.  He is motivated due to worsening report on his blood pressure at PCP clinic follow-up though did not start any medications he is working on lifestyle.  Skin rash is doing well on his extremities but is noticing itchy rash  on the center of the chest.  This bothers him usually for short time in the morning on a daily basis but is not severe he has not noticed any particular expansion over time.   Previous HPI 05/05/22 ESTIVEN KOHAN is a 38 y.o. male here for follow up for axial spondyloarthritis after starting Enbrel  50 mg  weekly. He has seen a large improvement in symptoms started since about 2 or 3 weeks in and has done well since then. Morning stiffness is now minimal. Flexibility in hips and knees improved. Still taking aleve routine twice daily. Skin rashes are also largely but not completely better on scalp, arms, and hips.   Previous HPI 02/10/22 HARSHAAN WHANG is a 38 y.o. male here for follow up for evaluation of recurrent left knee effusion and joint pains in shoulders, back,  and hips. Labs at initial visit positive for HLA-B27 and elevated ESR and CRP.  Since her last visit he had a left knee steroid injection with Dr. Cleatrice with improvement in symptoms as he needs to be functional for an upcoming golf event this weekend.  Otherwise symptoms remain about the same he is still requiring maximal dose of naproxen to mostly control symptoms.   Previous HPI 01/26/2022 KOLBEY TEICHERT is a 38 y.o. male here for evaluation of recurrent left knee effusion also joint pains increased in multiple areas. He has been noticing new or increased symptoms since last summer with left knee effusion and restricted range of motion. He also had ongoing right knee pain with patellar tendonitis.  Imaging without significant OA changes and knee aspiration with moderate WBC increase otherwise bland. The knee effusion remained improved for months and he participated in PT for several months with a good benefit in his symptoms. However over time and especially since finishing PT symptoms are worsening again now including mid back pain, shoulders, hips, and knee pains. His back pain is most severe at night and first thing in the  mornings. He sees a large but incomplete improvement with taking 2 tablets of naproxen, most effective at night time to reduce morning symptoms. He had recurrence of left knee effusion and aspiration by Dr. Cleatrice with findings of elevated cell count both RBCs and WBCs 7398. Previous aspiration from a year ago with elevated WBCs 4067. He does have a history of skin psoriasis, referred to new dermatology last October for increase in symptoms and he is controlling this with topical clobetasol . He has chronic GI symptoms attributed to IBS and also history of celiac disease. Currently these symptoms are at about normal baseline. He was also seeing his eye doctor recent with recurrent styes or hordeolum, but no history of iritis or uveitis known.    Labs reviewed 02/2021 RF neg ANA neg ESR 4 CRP 5   Imaging reviewed 02/10/2021 MRI Left knee IMPRESSION: Focal mild bony edema within the medial aspect of the mid patella, near the MPFL/medial retinacular attachment. The MPFL/medial retinaculum appear intact. This could represent a focal bony contusion or traction injury.   Moderate-sized joint effusion.   No evidence of meniscus tear.   Review of Systems  Constitutional:  Negative for fatigue.  HENT:  Negative for mouth sores and mouth dryness.   Eyes:  Positive for dryness.  Respiratory:  Negative for shortness of breath.   Cardiovascular:  Positive for hypertension. Negative for chest pain and palpitations.  Gastrointestinal:  Positive for diarrhea. Negative for blood in stool and constipation.  Endocrine: Negative for increased urination.  Genitourinary:  Negative for involuntary urination.  Musculoskeletal:  Negative for joint pain, gait problem, joint pain, joint swelling, myalgias, muscle weakness, morning stiffness, muscle tenderness and myalgias.  Skin:  Positive for rash. Negative for color change, hair loss and sensitivity to sunlight.  Allergic/Immunologic: Positive for susceptible  to infections.  Neurological:  Negative for dizziness and headaches.  Hematological:  Negative for swollen glands.  Psychiatric/Behavioral:  Negative for depressed mood and sleep disturbance. The patient is nervous/anxious.     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   Knee pain, chronic 03/04/2021   Allergy, unspecified, initial encounter 02/17/2021   Gastroesophageal reflux disease 04/09/2015    Past Medical History:  Diagnosis Date   Allergy    Anxiety    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   Blood transfusion without reported diagnosis    Chronic allergic rhinitis    Flat feet, bilateral    GERD (gastroesophageal reflux disease) 10/03/2014   NASH (nonalcoholic steatohepatitis)    Patellar tendinitis    Right Knee   Premature labor, onset of delivery before 37 weeks of gestation, fetus 5 07/09/2015   Psoriasis 09/2814    Family History  Problem Relation Age of Onset   Irritable bowel syndrome Mother    Kidney Stones Mother    Hypertension Father    Gallstones Father    Hyperlipidemia Father    Gallbladder disease Maternal Aunt    Kidney Stones Maternal Aunt    Diabetes Mellitus II Maternal Uncle    Pancreatic cancer Maternal Grandmother    Pancreatic disease Maternal Grandmother    Cancer Maternal Grandmother    Other Maternal Grandmother        Disk fusion   Dementia  Maternal Grandfather    Heart attack Maternal Grandfather    Gallstones Paternal Grandmother    Drug abuse Paternal Grandmother    Hypertension Paternal Grandmother    Kidney Stones Paternal Grandmother    Congestive Heart Failure Paternal Grandfather    Coronary artery disease Paternal Grandfather    Heart attack Paternal Grandfather    Diabetes Paternal Grandfather    Healthy Daughter    Past Surgical History:  Procedure Laterality Date   CHOLECYSTECTOMY N/A 07/23/2016   Procedure: LAPAROSCOPIC CHOLECYSTECTOMY WITH INTRAOPERATIVE CHOLANGIOGRAM;  Surgeon: Larinda Unknown Sharps, MD;  Location: [facility] ORS;  Service: General;  Laterality: N/A;   ESOPHAGOGASTRODUODENOSCOPY (EGD) WITH PROPOFOL  N/A 08/01/2015   Procedure: ESOPHAGOGASTRODUODENOSCOPY (EGD) WITH PROPOFOL ;  Surgeon: Gladis RAYMOND Mariner, MD;  Location: [facility] ENDOSCOPY;  Service: Endoscopy;  Laterality: N/A;   WISDOM TOOTH EXTRACTION     Social History   Social History Narrative   Not on file   Immunization History  Administered Date(s) Administered   Influenza, Seasonal, Injecte, Preservative Fre 02/11/2023, 02/15/2024   Influenza,inj,Quad PF,6+ Mos 04/03/2019, 03/21/2022   Influenza-Unspecified 03/02/2018, 02/27/2020, 04/03/2021   Tdap 01/31/2018     Objective: Vital Signs: BP (!) 140/96   Pulse (!) 56   Temp 98 F (36.7 C)   Resp 17   Ht 6' 5 (1.956 m)   Wt 243 lb (110.2 kg)   BMI 28.82 kg/m    Physical Exam Eyes:     Conjunctiva/sclera: Conjunctivae normal.  Cardiovascular:     Rate and Rhythm: Normal rate and regular rhythm.  Pulmonary:     Effort: Pulmonary effort is normal.     Breath sounds: Normal breath sounds.  Lymphadenopathy:     Cervical: No cervical adenopathy.  Skin:    General: Skin is warm and dry.     Findings: Rash present.     Comments: Left lateral hip rash, minimal tenderness  Neurological:     Mental Status: He is alert.  Psychiatric:        Mood and Affect: Mood normal.       Musculoskeletal Exam:  Shoulders full ROM no tenderness or swelling Elbows full ROM no tenderness or swelling Wrists full ROM no tenderness or swelling Fingers full ROM no tenderness or swelling Hip normal internal and external rotation without pain, no tenderness to lateral hip palpation Knees full ROM no tenderness or swelling Ankles full ROM no tenderness or swelling   Investigation: No additional findings.  Imaging: No results found.  Recent Labs: Lab Results  Component Value Date   WBC 4.2 01/31/2024   HGB 16.4 01/31/2024   PLT 225 01/31/2024   NA 139 01/31/2024   K 4.6 01/31/2024   CL 104 01/31/2024   CO2 28 01/31/2024   GLUCOSE 98 01/31/2024   BUN 18 01/31/2024   CREATININE 0.91 01/31/2024   BILITOT 1.1 01/31/2024   ALKPHOS 83 08/08/2023   AST 46 (H) 01/31/2024   ALT 99 (H) 01/31/2024   PROT 7.6 01/31/2024   ALBUMIN 4.3 08/08/2023   CALCIUM 9.5 01/31/2024   GFRAA 107 12/26/2018   QFTBGOLDPLUS NEGATIVE 01/31/2024    Speciality Comments: No specialty comments available.  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 - Continue current management plan.  Psoriasis of left hip Psoriasis with a small patch causing pain due to a broken scab. - Continue current management plan.  Elevated blood pressure Recent readings of 140/97 and 160/unknown.  Stress and lifestyle factors may contribute. No cardiovascular symptoms. - Continue to monitor blood pressure at home. - Follow up with primary care provider in March.  Elevated liver enzymes Liver enzymes approximately double normal, not requiring further investigation. No new symptoms. - Continue to monitor liver enzymes with upcoming labs in March.        Orders: No orders of the defined types were placed in this encounter.  Meds ordered this encounter  Medications   etanercept  (ENBREL  MINI) 50 MG/ML injection    Sig: Inject 50 mg into the skin once a week.    Dispense:  12 mL    Refill:  0     Follow-Up Instructions: Return in about 6 months (around 11/05/2024) for PsA on ENB f/u 6mos.   Lonni LELON Ester, MD  Note - This record has been created using [employer].  Chart creation errors have been sought, but may not always  have been located. Such creation errors do not reflect on  the standard of medical care.

## 2024-05-01 DIAGNOSIS — F411 Generalized anxiety disorder: Secondary | ICD-10-CM | POA: Diagnosis not present

## 2024-05-07 ENCOUNTER — Other Ambulatory Visit (HOSPITAL_COMMUNITY): Payer: Self-pay

## 2024-05-07 ENCOUNTER — Ambulatory Visit: Attending: Internal Medicine | Admitting: Internal Medicine

## 2024-05-07 ENCOUNTER — Encounter: Payer: Self-pay | Admitting: Internal Medicine

## 2024-05-07 ENCOUNTER — Other Ambulatory Visit: Payer: Self-pay

## 2024-05-07 VITALS — BP 140/96 | HR 56 | Temp 98.0°F | Resp 17 | Ht 77.0 in | Wt 243.0 lb

## 2024-05-07 DIAGNOSIS — L409 Psoriasis, unspecified: Secondary | ICD-10-CM | POA: Diagnosis not present

## 2024-05-07 DIAGNOSIS — M452 Ankylosing spondylitis of cervical region: Secondary | ICD-10-CM | POA: Diagnosis not present

## 2024-05-07 DIAGNOSIS — Z79899 Other long term (current) drug therapy: Secondary | ICD-10-CM

## 2024-05-07 MED ORDER — ENBREL MINI 50 MG/ML ~~LOC~~ SOCT
50.0000 mg | SUBCUTANEOUS | 0 refills | Status: AC
  Filled 2024-05-07: qty 4, 28d supply, fill #0
  Filled 2024-05-07: qty 12, 84d supply, fill #0
  Filled 2024-05-30: qty 4, 28d supply, fill #1
  Filled 2024-06-29: qty 4, 28d supply, fill #2

## 2024-05-08 ENCOUNTER — Other Ambulatory Visit: Payer: Self-pay

## 2024-05-08 NOTE — Progress Notes (Signed)
 Specialty Pharmacy Refill Coordination Note  Luis Khan is a 38 y.o. male contacted today regarding refills of specialty medication(s) Etanercept  (Enbrel  Mini)   Patient requested Delivery   Delivery date: 05/10/24   Verified address: [address]   [name] [phone]   Medication Luis be filled on: 05/09/24

## 2024-05-09 ENCOUNTER — Other Ambulatory Visit (HOSPITAL_BASED_OUTPATIENT_CLINIC_OR_DEPARTMENT_OTHER): Payer: Self-pay | Admitting: Family Medicine

## 2024-05-09 DIAGNOSIS — K219 Gastro-esophageal reflux disease without esophagitis: Secondary | ICD-10-CM

## 2024-05-10 DIAGNOSIS — F411 Generalized anxiety disorder: Secondary | ICD-10-CM | POA: Diagnosis not present

## 2024-05-10 MED ORDER — PANTOPRAZOLE SODIUM 40 MG PO TBEC
40.0000 mg | DELAYED_RELEASE_TABLET | Freq: Every day | ORAL | 1 refills | Status: AC
  Filled 2024-05-10: qty 90, 90d supply, fill #0

## 2024-05-10 MED ORDER — ESCITALOPRAM OXALATE 5 MG PO TABS
5.0000 mg | ORAL_TABLET | Freq: Every day | ORAL | 1 refills | Status: AC
  Filled 2024-05-10: qty 90, 90d supply, fill #0

## 2024-05-11 ENCOUNTER — Other Ambulatory Visit: Payer: Self-pay

## 2024-05-15 DIAGNOSIS — F411 Generalized anxiety disorder: Secondary | ICD-10-CM | POA: Diagnosis not present

## 2024-05-16 DIAGNOSIS — F411 Generalized anxiety disorder: Secondary | ICD-10-CM | POA: Diagnosis not present

## 2024-05-24 DIAGNOSIS — F411 Generalized anxiety disorder: Secondary | ICD-10-CM | POA: Diagnosis not present

## 2024-05-28 DIAGNOSIS — F411 Generalized anxiety disorder: Secondary | ICD-10-CM | POA: Diagnosis not present

## 2024-05-30 ENCOUNTER — Other Ambulatory Visit: Payer: Self-pay | Admitting: Pharmacy Technician

## 2024-05-30 ENCOUNTER — Other Ambulatory Visit: Payer: Self-pay

## 2024-05-30 ENCOUNTER — Encounter (INDEPENDENT_AMBULATORY_CARE_PROVIDER_SITE_OTHER): Payer: Self-pay

## 2024-05-30 NOTE — Progress Notes (Signed)
 Specialty Pharmacy Refill Coordination Note  Luis Khan is a 38 y.o. male contacted today regarding refills of specialty medication(s) Etanercept  (Enbrel  Mini)   Patient requested Delivery   Delivery date: 06/06/24   Verified address: [address], [place] [address]   Medication Luis be filled on: 06/05/24

## 2024-06-05 ENCOUNTER — Other Ambulatory Visit: Payer: Self-pay

## 2024-06-26 ENCOUNTER — Other Ambulatory Visit (HOSPITAL_COMMUNITY): Payer: Self-pay

## 2024-06-29 ENCOUNTER — Other Ambulatory Visit (HOSPITAL_COMMUNITY): Payer: Self-pay

## 2024-07-03 ENCOUNTER — Encounter (INDEPENDENT_AMBULATORY_CARE_PROVIDER_SITE_OTHER): Payer: Self-pay

## 2024-07-03 ENCOUNTER — Other Ambulatory Visit: Payer: Self-pay

## 2024-07-03 NOTE — Progress Notes (Signed)
 Specialty Pharmacy Refill Coordination Note  Luis Khan is a 39 y.o. male contacted today regarding refills of specialty medication(s) Etanercept  (Enbrel  Mini)   Patient requested (Patient-Rptd) Delivery   Delivery date: 07/05/24   Verified address: [address] [place] [address]   Medication Luis be filled on: 07/04/24

## 2024-07-04 ENCOUNTER — Other Ambulatory Visit: Payer: Self-pay

## 2024-07-11 ENCOUNTER — Other Ambulatory Visit (HOSPITAL_COMMUNITY): Payer: Self-pay

## 2024-07-11 MED ORDER — CLINDAMYCIN PHOSPHATE 1 % EX LOTN
TOPICAL_LOTION | CUTANEOUS | 3 refills | Status: AC
  Filled 2024-07-11: qty 60, 30d supply, fill #0

## 2024-07-11 MED ORDER — CALCIPOTRIENE 0.005 % EX CREA
TOPICAL_CREAM | Freq: Two times a day (BID) | CUTANEOUS | 3 refills | Status: AC
  Filled 2024-07-11: qty 60, 30d supply, fill #0

## 2024-07-12 ENCOUNTER — Other Ambulatory Visit (HOSPITAL_COMMUNITY): Payer: Self-pay

## 2024-08-20 ENCOUNTER — Ambulatory Visit (HOSPITAL_BASED_OUTPATIENT_CLINIC_OR_DEPARTMENT_OTHER)

## 2024-08-27 ENCOUNTER — Encounter (HOSPITAL_BASED_OUTPATIENT_CLINIC_OR_DEPARTMENT_OTHER): Admitting: Family Medicine

## 2024-11-06 ENCOUNTER — Ambulatory Visit: Admitting: Internal Medicine
